# Patient Record
Sex: Female | Born: 1995 | Hispanic: Yes | Marital: Married | Smoking: Never smoker
Health system: Southern US, Community
[De-identification: ages and names within clinical notes are randomized; demographics above are authoritative.]

## PROBLEM LIST (undated history)

## (undated) ENCOUNTER — Emergency Department (HOSPITAL_COMMUNITY): Admission: EM | Payer: Medicaid Other | Source: Home / Self Care

## (undated) DIAGNOSIS — C801 Malignant (primary) neoplasm, unspecified: Secondary | ICD-10-CM

## (undated) DIAGNOSIS — E0590 Thyrotoxicosis, unspecified without thyrotoxic crisis or storm: Secondary | ICD-10-CM

## (undated) DIAGNOSIS — I10 Essential (primary) hypertension: Secondary | ICD-10-CM

## (undated) DIAGNOSIS — R519 Headache, unspecified: Secondary | ICD-10-CM

## (undated) DIAGNOSIS — F419 Anxiety disorder, unspecified: Secondary | ICD-10-CM

## (undated) DIAGNOSIS — R7303 Prediabetes: Secondary | ICD-10-CM

## (undated) DIAGNOSIS — R51 Headache: Secondary | ICD-10-CM

## (undated) DIAGNOSIS — F32A Depression, unspecified: Secondary | ICD-10-CM

## (undated) DIAGNOSIS — F329 Major depressive disorder, single episode, unspecified: Secondary | ICD-10-CM

## (undated) DIAGNOSIS — D649 Anemia, unspecified: Secondary | ICD-10-CM

## (undated) DIAGNOSIS — K429 Umbilical hernia without obstruction or gangrene: Secondary | ICD-10-CM

## (undated) DIAGNOSIS — N281 Cyst of kidney, acquired: Secondary | ICD-10-CM

## (undated) HISTORY — DX: Headache: R51

## (undated) HISTORY — DX: Headache, unspecified: R51.9

## (undated) HISTORY — PX: BUNIONECTOMY: SHX129

---
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
## 2014-03-04 DIAGNOSIS — F329 Major depressive disorder, single episode, unspecified: Secondary | ICD-10-CM | POA: Diagnosis not present

## 2014-03-04 DIAGNOSIS — O133 Gestational [pregnancy-induced] hypertension without significant proteinuria, third trimester: Secondary | ICD-10-CM

## 2014-03-04 DIAGNOSIS — F1290 Cannabis use, unspecified, uncomplicated: Secondary | ICD-10-CM | POA: Diagnosis not present

## 2014-03-04 DIAGNOSIS — O99343 Other mental disorders complicating pregnancy, third trimester: Secondary | ICD-10-CM | POA: Diagnosis not present

## 2014-03-04 DIAGNOSIS — O99323 Drug use complicating pregnancy, third trimester: Secondary | ICD-10-CM | POA: Diagnosis not present

## 2014-05-27 ENCOUNTER — Ambulatory Visit: Payer: Self-pay | Admitting: Internal Medicine

## 2014-08-28 LAB — OB RESULTS CONSOLE RPR: RPR: NONREACTIVE

## 2014-08-28 LAB — OB RESULTS CONSOLE HGB/HCT, BLOOD
HCT: 36 %
Hemoglobin: 11.9 g/dL

## 2014-08-28 LAB — OB RESULTS CONSOLE ANTIBODY SCREEN: Antibody Screen: NEGATIVE

## 2014-08-28 LAB — OB RESULTS CONSOLE GC/CHLAMYDIA
Chlamydia: NEGATIVE
Gonorrhea: NEGATIVE

## 2014-08-28 LAB — OB RESULTS CONSOLE ABO/RH: RH Type: POSITIVE

## 2014-08-28 LAB — OB RESULTS CONSOLE RUBELLA ANTIBODY, IGM: Rubella: IMMUNE

## 2014-08-28 LAB — OB RESULTS CONSOLE PLATELET COUNT: Platelets: 291 10*3/uL

## 2014-08-28 LAB — OB RESULTS CONSOLE HIV ANTIBODY (ROUTINE TESTING): HIV: NONREACTIVE

## 2014-08-28 LAB — OB RESULTS CONSOLE HEPATITIS B SURFACE ANTIGEN: Hepatitis B Surface Ag: NEGATIVE

## 2014-12-11 ENCOUNTER — Encounter: Payer: Self-pay | Admitting: *Deleted

## 2014-12-11 DIAGNOSIS — O0990 Supervision of high risk pregnancy, unspecified, unspecified trimester: Secondary | ICD-10-CM

## 2014-12-12 ENCOUNTER — Encounter: Payer: Self-pay | Admitting: Physician Assistant

## 2014-12-18 ENCOUNTER — Ambulatory Visit (INDEPENDENT_AMBULATORY_CARE_PROVIDER_SITE_OTHER): Payer: Medicaid Other | Admitting: Advanced Practice Midwife

## 2014-12-18 ENCOUNTER — Encounter: Payer: Self-pay | Admitting: Advanced Practice Midwife

## 2014-12-18 VITALS — BP 129/76 | HR 98 | Temp 98.4°F | Wt 154.3 lb

## 2014-12-18 DIAGNOSIS — O2242 Hemorrhoids in pregnancy, second trimester: Secondary | ICD-10-CM

## 2014-12-18 DIAGNOSIS — Z3402 Encounter for supervision of normal first pregnancy, second trimester: Secondary | ICD-10-CM

## 2014-12-18 LAB — POCT URINALYSIS DIP (DEVICE)
Bilirubin Urine: NEGATIVE
Glucose, UA: NEGATIVE mg/dL
Hgb urine dipstick: NEGATIVE
Ketones, ur: NEGATIVE mg/dL
Leukocytes, UA: NEGATIVE
Nitrite: NEGATIVE
Protein, ur: NEGATIVE mg/dL
Specific Gravity, Urine: 1.02 (ref 1.005–1.030)
Urobilinogen, UA: 0.2 mg/dL (ref 0.0–1.0)
pH: 7 (ref 5.0–8.0)

## 2014-12-18 MED ORDER — PRENATAL VITAMINS 28-0.8 MG PO TABS: 1.0000 | ORAL_TABLET | Freq: Every day | ORAL | Status: DC

## 2014-12-18 MED ORDER — LIDOCAINE HCL 2 % EX GEL: 1.0000 "application " | CUTANEOUS | Status: DC | PRN

## 2014-12-18 NOTE — Progress Notes (Signed)
   Subjective:    Megan Webb is a G1P0000 [redacted]w[redacted]d being seen today for her first obstetrical visit.  Her obstetrical history is significant for hx anxiety and suicide attempt in 2013, G1 this pregnancy. Pregnancy history fully reviewed.  Patient reports no complaints.  Filed Vitals:   12/18/14 0856  BP: 129/76  Pulse: 98  Temp: 98.4 F (36.9 C)  Weight: 154 lb 4.8 oz (69.99 kg)    HISTORY: OB History  Gravida Para Term Preterm AB SAB TAB Ectopic Multiple Living  1 0 0 0 0 0 0 0 0 0     # Outcome Date GA Lbr Len/2nd Weight Sex Delivery Anes PTL Lv  1 Current              Past Medical History  Diagnosis Date  . Headache    Past Surgical History  Procedure Laterality Date  . Bunionectomy    . Bunionectomy     Family History  Problem Relation Age of Onset  . Mental illness Mother     anxiety and depression  . Hypertension Mother   . Kidney disease Mother   . Anemia Mother   . Cataracts Father   . Asthma Brother   . Diabetes Maternal Aunt   . Cancer Maternal Aunt     breast  . Diabetes Maternal Uncle   . Arthritis Maternal Grandmother   . Stroke Maternal Grandmother   . Hypertension Maternal Grandmother   . Arthritis Maternal Grandfather   . Hypertension Maternal Grandfather   . Stroke Maternal Grandfather   . Arthritis Paternal Grandfather   . Stroke Paternal Grandfather      Exam    Uterus:  Fundal Height: 27 cm  Pelvic Exam: Deferred--reviewed records   Skin: normal coloration and turgor, no rashes    Neurologic: oriented, normal, gait normal; reflexes normal and symmetric   Extremities: normal strength, tone, and muscle mass, ROM of all joints is normal   HEENT sclera clear, anicteric   Mouth/Teeth mucous membranes moist, pharynx normal without lesions and dental hygiene good   Neck supple and no masses   Cardiovascular:    Respiratory:  appears well, vitals normal, no respiratory distress, acyanotic, normal RR, ear and throat exam is  normal, neck free of mass or lymphadenopathy   Abdomen: soft, non-tender; bowel sounds normal; no masses,  no organomegaly   Urinary:       Assessment:    Pregnancy: G1P0000  1. Encounter for supervision of normal first pregnancy in second trimester   2. Hemorrhoids during pregnancy in second trimester, antepartum Discussed OTC treatments including Tucks pads, hemorrhoid cream.  Rx for Lidocaine jelly 2% topical.  Recommend high fiber diet, increased PO fluids to prevent constipation.   Patient Active Problem List   Diagnosis Date Noted  . Supervision of high-risk pregnancy 12/11/2014    Priority: High  . Cannabis abuse 08/16/2011  . Suicide attempt by drug ingestion ([place]) 08/15/2011  . 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 Anxiety 08/15/2011  . Benzodiazepine (tranquilizer) overdose 08/14/2011  . Somnolence 08/14/2011        Plan:     Initial labs drawn. Prenatal vitamins. Problem list reviewed and updated. Genetic Screening discussed First Screen: results reviewed.  Ultrasound discussed; fetal survey: results reviewed.  Follow up in 2 weeks.   LEFTWICH-KIRBY, LISA 12/18/2014

## 2014-12-18 NOTE — Patient Instructions (Signed)
Recommend Tucks witchhazel pads, hemorrhoid cream, Rx for lidocaine jelly for hemorrhoids  Hemorrhoids Hemorrhoids are swollen veins around the rectum or anus. There are two types of hemorrhoids:   Internal hemorrhoids. These occur in the veins just inside the rectum. They may poke through to the outside and become irritated and painful.  External hemorrhoids. These occur in the veins outside the anus and can be felt as a painful swelling or hard lump near the anus. CAUSES  Pregnancy.   Obesity.   Constipation or diarrhea.   Straining to have a bowel movement.   Sitting for long periods on the toilet.  Heavy lifting or other activity that caused you to strain.  Anal intercourse. SYMPTOMS   Pain.   Anal itching or irritation.   Rectal bleeding.   Fecal leakage.   Anal swelling.   One or more lumps around the anus.  DIAGNOSIS  Your caregiver may be able to diagnose hemorrhoids by visual examination. Other examinations or tests that may be performed include:   Examination of the rectal area with a gloved hand (digital rectal exam).   Examination of anal canal using a small tube (scope).   A blood test if you have lost a significant amount of blood.  A test to look inside the colon (sigmoidoscopy or colonoscopy). TREATMENT Most hemorrhoids can be treated at home. However, if symptoms do not seem to be getting better or if you have a lot of rectal bleeding, your caregiver may perform a procedure to help make the hemorrhoids get smaller or remove them completely. Possible treatments include:   Placing a rubber band at the base of the hemorrhoid to cut off the circulation (rubber band ligation).   Injecting a chemical to shrink the hemorrhoid (sclerotherapy).   Using a tool to burn the hemorrhoid (infrared light therapy).   Surgically removing the hemorrhoid (hemorrhoidectomy).   Stapling the hemorrhoid to block blood flow to the tissue (hemorrhoid  stapling).  HOME CARE INSTRUCTIONS   Eat foods with fiber, such as whole grains, beans, nuts, fruits, and vegetables. Ask your doctor about taking products with added fiber in them (fibersupplements).  Increase fluid intake. Drink enough water and fluids to keep your urine clear or pale yellow.   Exercise regularly.   Go to the bathroom when you have the urge to have a bowel movement. Do not wait.   Avoid straining to have bowel movements.   Keep the anal area dry and clean. Use wet toilet paper or moist towelettes after a bowel movement.   Medicated creams and suppositories may be used or applied as directed.   Only take over-the-counter or prescription medicines as directed by your caregiver.   Take warm sitz baths for 15-20 minutes, 3-4 times a day to ease pain and discomfort.   Place ice packs on the hemorrhoids if they are tender and swollen. Using ice packs between sitz baths may be helpful.   Put ice in a plastic bag.   Place a towel between your skin and the bag.   Leave the ice on for 15-20 minutes, 3-4 times a day.   Do not use a donut-shaped pillow or sit on the toilet for long periods. This increases blood pooling and pain.  SEEK MEDICAL CARE IF:  You have increasing pain and swelling that is not controlled by treatment or medicine.  You have uncontrolled bleeding.  You have difficulty or you are unable to have a bowel movement.  You have pain or inflammation  outside the area of the hemorrhoids. MAKE SURE YOU:  Understand these instructions.  Will watch your condition.  Will get help right away if you are not doing well or get worse.   This information is not intended to replace advice given to you by your health care provider. Make sure you discuss any questions you have with your health care provider.   Document Released: 01/09/2000 Document Revised: 12/29/2011 Document Reviewed: 11/16/2011 Elsevier Interactive Patient Education [employer]

## 2014-12-31 ENCOUNTER — Encounter: Payer: Self-pay | Admitting: Advanced Practice Midwife

## 2014-12-31 ENCOUNTER — Ambulatory Visit (INDEPENDENT_AMBULATORY_CARE_PROVIDER_SITE_OTHER): Payer: Medicaid Other | Admitting: Advanced Practice Midwife

## 2014-12-31 VITALS — BP 128/71 | HR 88 | Temp 98.3°F | Wt 159.6 lb

## 2014-12-31 DIAGNOSIS — R7309 Other abnormal glucose: Secondary | ICD-10-CM

## 2014-12-31 DIAGNOSIS — O0992 Supervision of high risk pregnancy, unspecified, second trimester: Secondary | ICD-10-CM

## 2014-12-31 DIAGNOSIS — O99810 Abnormal glucose complicating pregnancy: Secondary | ICD-10-CM | POA: Insufficient documentation

## 2014-12-31 DIAGNOSIS — Z23 Encounter for immunization: Secondary | ICD-10-CM

## 2014-12-31 DIAGNOSIS — R7302 Impaired glucose tolerance (oral): Secondary | ICD-10-CM

## 2014-12-31 LAB — POCT URINALYSIS DIP (DEVICE)
Bilirubin Urine: NEGATIVE
Glucose, UA: 500 mg/dL — AB
Hgb urine dipstick: NEGATIVE
Ketones, ur: NEGATIVE mg/dL
Nitrite: NEGATIVE
Protein, ur: NEGATIVE mg/dL
Specific Gravity, Urine: 1.01 (ref 1.005–1.030)
Urobilinogen, UA: 0.2 mg/dL (ref 0.0–1.0)
pH: 6 (ref 5.0–8.0)

## 2014-12-31 MED ORDER — TETANUS-DIPHTH-ACELL PERTUSSIS 5-2.5-18.5 LF-MCG/0.5 IM SUSP
0.5000 mL | Freq: Once | INTRAMUSCULAR | Status: AC
  Administered 2014-12-31: 0.5 mL via INTRAMUSCULAR

## 2014-12-31 NOTE — Patient Instructions (Signed)

## 2014-12-31 NOTE — Progress Notes (Signed)
Subjective:  Megan Webb is a 19 y.o. G1P0000 at [redacted]w[redacted]d being seen today for ongoing prenatal care.  She is currently monitored for the following issues for this low-risk pregnancy and has Benzodiazepine (tranquilizer) overdose; Somnolence; Suicide attempt by drug ingestion ([place]); Recurrent major depression-severe ([place]); Anxiety; Cannabis abuse; and Supervision of high-risk pregnancy on her problem list.  Patient reports no complaints.  Contractions: Not present. Vag. Bleeding: None.  Movement: Present. Denies leaking of fluid.   The following portions of the patient's history were reviewed and updated as appropriate: allergies, current medications, past family history, past medical history, past social history, past surgical history and problem list. Problem list updated.  Objective:   Filed Vitals:   12/31/14 1532  BP: 128/71  Pulse: 88  Temp: 98.3 F (36.8 C)  Weight: 72.394 kg (159 lb 9.6 oz)    Fetal Status: Fetal Heart Rate (bpm): 166   Movement: Present     General:  Alert, oriented and cooperative. Patient is in no acute distress.  Skin: Skin is warm and dry. No rash noted.   Cardiovascular: Normal heart rate noted  Respiratory: Normal respiratory effort, no problems with respiration noted  Abdomen: Soft, gravid, appropriate for gestational age. Pain/Pressure: Present     Pelvic: Vag. Bleeding: None Vag D/C Character: Mucous   Cervical exam deferred        Extremities: Normal range of motion.  Edema: Trace  Mental Status: Normal mood and affect. Normal behavior. Normal judgment and thought content.   Urinalysis: Urine Protein: Negative Urine Glucose: 3+  Assessment and Plan:  Pregnancy: G1P0000 at [redacted]w[redacted]d  1. Supervision of high-risk pregnancy, second trimester      Continue care - Tdap (BOOSTRIX) injection 0.5 mL; Inject 0.5 mLs into the muscle once.  Preterm labor symptoms and general obstetric precautions including but not limited to vaginal bleeding,  contractions, leaking of fluid and fetal movement were reviewed in detail with the patient. Please refer to After Visit Summary for other counseling recommendations.  Return in about 2 weeks (around 01/14/2015) for [facility].   Seabron Spates, CNM

## 2015-01-01 ENCOUNTER — Other Ambulatory Visit: Payer: 59

## 2015-01-08 ENCOUNTER — Other Ambulatory Visit: Payer: 59

## 2015-01-08 DIAGNOSIS — O99810 Abnormal glucose complicating pregnancy: Secondary | ICD-10-CM

## 2015-01-08 LAB — CBC
HCT: 31.5 % — ABNORMAL LOW (ref 36.0–46.0)
Hemoglobin: 10.2 g/dL — ABNORMAL LOW (ref 12.0–15.0)
MCH: 27.1 pg (ref 26.0–34.0)
MCHC: 32.4 g/dL (ref 30.0–36.0)
MCV: 83.8 fL (ref 78.0–100.0)
MPV: 10.2 fL (ref 8.6–12.4)
Platelets: 210 10*3/uL (ref 150–400)
RBC: 3.76 MIL/uL — ABNORMAL LOW (ref 3.87–5.11)
RDW: 15.4 % (ref 11.5–15.5)
WBC: 9.2 10*3/uL (ref 4.0–10.5)

## 2015-01-09 LAB — HIV ANTIBODY (ROUTINE TESTING W REFLEX): HIV 1&2 Ab, 4th Generation: NONREACTIVE

## 2015-01-09 LAB — GLUCOSE TOLERANCE, 1 HOUR (50G) W/O FASTING: Glucose, 1 Hour GTT: 130 mg/dL (ref 70–140)

## 2015-01-09 LAB — RPR

## 2015-01-14 ENCOUNTER — Encounter: Payer: 59 | Admitting: Advanced Practice Midwife

## 2015-01-15 ENCOUNTER — Encounter: Payer: Self-pay | Admitting: Advanced Practice Midwife

## 2015-01-15 ENCOUNTER — Ambulatory Visit (INDEPENDENT_AMBULATORY_CARE_PROVIDER_SITE_OTHER): Payer: 59 | Admitting: Advanced Practice Midwife

## 2015-01-15 VITALS — BP 124/58 | HR 78 | Temp 98.2°F | Wt 162.6 lb

## 2015-01-15 DIAGNOSIS — Z3403 Encounter for supervision of normal first pregnancy, third trimester: Secondary | ICD-10-CM

## 2015-01-15 LAB — POCT URINALYSIS DIP (DEVICE)
Bilirubin Urine: NEGATIVE
Glucose, UA: NEGATIVE mg/dL
Hgb urine dipstick: NEGATIVE
Ketones, ur: NEGATIVE mg/dL
Nitrite: NEGATIVE
Protein, ur: NEGATIVE mg/dL
Specific Gravity, Urine: 1.02 (ref 1.005–1.030)
Urobilinogen, UA: 0.2 mg/dL (ref 0.0–1.0)
pH: 6.5 (ref 5.0–8.0)

## 2015-01-15 NOTE — Progress Notes (Signed)
Subjective:  Megan Webb is a 19 y.o. G1P0000 at [redacted]w[redacted]d being seen today for ongoing prenatal care.  She is currently monitored for the following issues for this high risk pregnancy and has Benzodiazepine (tranquilizer) overdose; Somnolence; Suicide attempt by drug ingestion ([place]); Recurrent major depression-severe ([place]); Anxiety; Cannabis abuse; Supervision of high-risk pregnancy; and Abnormal glucose tolerance in pregnancy on her problem list.  Patient reports no complaints.  Contractions: Not present. Vag. Bleeding: None.  Movement: Present. Denies leaking of fluid.   The following portions of the patient's history were reviewed and updated as appropriate: allergies, current medications, past family history, past medical history, past social history, past surgical history and problem list. Problem list updated.  Objective:   Filed Vitals:   01/15/15 1543  BP: 124/58  Pulse: 78  Temp: 98.2 F (36.8 C)  Weight: 162 lb 9.6 oz (73.755 kg)    Fetal Status: Fetal Heart Rate (bpm): 144   Movement: Present     General:  Alert, oriented and cooperative. Patient is in no acute distress.  Skin: Skin is warm and dry. No rash noted.   Cardiovascular: Normal heart rate noted  Respiratory: Normal respiratory effort, no problems with respiration noted  Abdomen: Soft, gravid, appropriate for gestational age. Pain/Pressure: Absent     Pelvic: Vag. Bleeding: None     Cervical exam deferred        Extremities: Normal range of motion.  Edema: Trace  Mental Status: Normal mood and affect. Normal behavior. Normal judgment and thought content.   Urinalysis: Urine Protein: Negative Urine Glucose: Negative  Assessment and Plan:  Pregnancy: G1P0000 at [redacted]w[redacted]d  There are no diagnoses linked to this encounter. Preterm labor symptoms and general obstetric precautions including but not limited to vaginal bleeding, contractions, leaking of fluid and fetal movement were reviewed in detail with the  patient. Please refer to After Visit Summary for other counseling recommendations.  RTC 2 weeks  Seabron Spates, CNM

## 2015-01-15 NOTE — Progress Notes (Signed)
Breastfeeding tip of the week reviewed. 

## 2015-01-15 NOTE — Patient Instructions (Signed)

## 2015-01-26 NOTE — L&D Delivery Note (Signed)
Delivery Note At 10:13 PM a viable female was delivered via Vaginal, Spontaneous Delivery (Presentation: ; Occiput Anterior).  APGAR: 8, 9; weight  .   Placenta status: Intact, Spontaneous.  Cord: 3 vessels with the following complications: None.  Cord pH:   Anesthesia: Epidural Local  Episiotomy: None Lacerations: Periurethral, bilateral Suture Repair: 3.0 monocryl Est. Blood Loss (mL): 750  Mom to postpartum.  Baby to Couplet care / Skin to Skin.  Megan Webb H 03/13/2015, 12:53 AM

## 2015-01-29 ENCOUNTER — Ambulatory Visit (INDEPENDENT_AMBULATORY_CARE_PROVIDER_SITE_OTHER): Payer: 59 | Admitting: Advanced Practice Midwife

## 2015-01-29 VITALS — BP 124/72 | HR 83 | Temp 98.5°F | Wt 163.0 lb

## 2015-01-29 DIAGNOSIS — O0992 Supervision of high risk pregnancy, unspecified, second trimester: Secondary | ICD-10-CM

## 2015-01-29 DIAGNOSIS — F1210 Cannabis abuse, uncomplicated: Secondary | ICD-10-CM

## 2015-01-29 DIAGNOSIS — O99323 Drug use complicating pregnancy, third trimester: Secondary | ICD-10-CM

## 2015-01-29 DIAGNOSIS — O2340 Unspecified infection of urinary tract in pregnancy, unspecified trimester: Secondary | ICD-10-CM

## 2015-01-29 DIAGNOSIS — Z3483 Encounter for supervision of other normal pregnancy, third trimester: Secondary | ICD-10-CM

## 2015-01-29 LAB — POCT URINALYSIS DIP (DEVICE)
Bilirubin Urine: NEGATIVE
Glucose, UA: NEGATIVE mg/dL
Hgb urine dipstick: NEGATIVE
Ketones, ur: 40 mg/dL — AB
Nitrite: NEGATIVE
Protein, ur: NEGATIVE mg/dL
Specific Gravity, Urine: 1.025 (ref 1.005–1.030)
Urobilinogen, UA: 0.2 mg/dL (ref 0.0–1.0)
pH: 6.5 (ref 5.0–8.0)

## 2015-01-29 NOTE — Progress Notes (Signed)
Subjective:  Megan Webb is a 20 y.o. G1P0000 at [redacted]w[redacted]d being seen today for ongoing prenatal care.  She is currently monitored for the following issues for this low-risk pregnancy and has Benzodiazepine (tranquilizer) overdose; Somnolence; Suicide attempt by drug ingestion ([place]); Recurrent major depression-severe ([place]); Anxiety; Cannabis abuse; Supervision of high-risk pregnancy; and Abnormal glucose tolerance in pregnancy on her problem list.  Patient reports no complaints.  Contractions: Not present. Vag. Bleeding: None.  Movement: Present. Denies leaking of fluid.   The following portions of the patient's history were reviewed and updated as appropriate: allergies, current medications, past family history, past medical history, past social history, past surgical history and problem list. Problem list updated.  Objective:   Filed Vitals:   01/29/15 1437  BP: 124/72  Pulse: 83  Temp: 98.5 F (36.9 C)  Weight: 163 lb (73.936 kg)    Fetal Status: Fetal Heart Rate (bpm): 145 Fundal Height: 32 cm Movement: Present     General:  Alert, oriented and cooperative. Patient is in no acute distress.  Skin: Skin is warm and dry. No rash noted.   Cardiovascular: Normal heart rate noted  Respiratory: Normal respiratory effort, no problems with respiration noted  Abdomen: Soft, gravid, appropriate for gestational age. Pain/Pressure: Absent     Pelvic: Vag. Bleeding: None     Cervical exam deferred        Extremities: Normal range of motion.  Edema: Trace  Mental Status: Normal mood and affect. Normal behavior. Normal judgment and thought content.   Urinalysis: Urine Protein: Negative Urine Glucose: Negative  Assessment and Plan:  Pregnancy: G1P0000 at [redacted]w[redacted]d  1. Supervision of high-risk pregnancy, second trimester   2. UTI in pregnancy, antepartum, unspecified trimester  - Culture, OB Urine  Preterm labor symptoms and general obstetric precautions including but not limited to  vaginal bleeding, contractions, leaking of fluid and fetal movement were reviewed in detail with the patient. Please refer to After Visit Summary for other counseling recommendations.  Return in about 2 weeks (around 02/12/2015).   Manya Silvas, CNM

## 2015-01-29 NOTE — Patient Instructions (Addendum)
Preterm Labor Information Preterm labor is when labor starts at less than 37 weeks of pregnancy. The normal length of a pregnancy is 39 to 41 weeks. CAUSES Often, there is no identifiable underlying cause as to why a woman goes into preterm labor. One of the most common known causes of preterm labor is infection. Infections of the uterus, cervix, vagina, amniotic sac, bladder, kidney, or even the lungs (pneumonia) can cause labor to start. Other suspected causes of preterm labor include:   Urogenital infections, such as yeast infections and bacterial vaginosis.   Uterine abnormalities (uterine shape, uterine septum, fibroids, or bleeding from the placenta).   A cervix that has been operated on (it may fail to stay closed).   Malformations in the fetus.   Multiple gestations (twins, triplets, and so on).   Breakage of the amniotic sac.  RISK FACTORS  Having a previous history of preterm labor.   Having premature rupture of membranes (PROM).   Having a placenta that covers the opening of the cervix (placenta previa).   Having a placenta that separates from the uterus (placental abruption).   Having a cervix that is too weak to hold the fetus in the uterus (incompetent cervix).   Having too much fluid in the amniotic sac (polyhydramnios).   Taking illegal drugs or smoking while pregnant.   Not gaining enough weight while pregnant.   Being younger than 13 and older than 21 years old.   Having a low socioeconomic status.   Being African American. SYMPTOMS Signs and symptoms of preterm labor include:   Menstrual-like cramps, abdominal pain, or back pain.  Uterine contractions that are regular, as frequent as six in an hour, regardless of their intensity (may be mild or painful).  Contractions that start on the top of the uterus and spread down to the lower abdomen and back.   A sense of increased pelvic pressure.   A watery or bloody mucus discharge that  comes from the vagina.  TREATMENT Depending on the length of the pregnancy and other circumstances, your health care provider may suggest bed rest. If necessary, there are medicines that can be given to stop contractions and to mature the fetal lungs. If labor happens before 34 weeks of pregnancy, a prolonged hospital stay may be recommended. Treatment depends on the condition of both you and the fetus.  WHAT SHOULD YOU DO IF YOU THINK YOU ARE IN PRETERM LABOR? Call your health care provider right away. You will need to go to the hospital to get checked immediately. HOW CAN YOU PREVENT PRETERM LABOR IN FUTURE PREGNANCIES? You should:   Stop smoking if you smoke.  Maintain healthy weight gain and avoid chemicals and drugs that are not necessary.  Be watchful for any type of infection.  Inform your health care provider if you have a known history of preterm labor.   This information is not intended to replace advice given to you by your health care provider. Make sure you discuss any questions you have with your health care provider.   Document Released: 04/03/2003 Document Revised: 09/13/2012 Document Reviewed: 02/14/2012 Elsevier Interactive Patient Education [employer].  Contraception Choices Contraception (birth control) is the use of any methods or devices to prevent pregnancy. Below are some methods to help avoid pregnancy. HORMONAL METHODS   Contraceptive implant. This is a thin, plastic tube containing progesterone hormone. It does not contain estrogen hormone. Your health care provider inserts the tube in the inner part of the upper  arm. The tube can remain in place for up to 3 years. After 3 years, the implant must be removed. The implant prevents the ovaries from releasing an egg (ovulation), thickens the cervical mucus to prevent sperm from entering the uterus, and thins the lining of the inside of the uterus.  Progesterone-only injections. These injections are given  every 3 months by your health care provider to prevent pregnancy. This synthetic progesterone hormone stops the ovaries from releasing eggs. It also thickens cervical mucus and changes the uterine lining. This makes it harder for sperm to survive in the uterus.  Birth control pills. These pills contain estrogen and progesterone hormone. They work by preventing the ovaries from releasing eggs (ovulation). They also cause the cervical mucus to thicken, preventing the sperm from entering the uterus. Birth control pills are prescribed by a health care provider.Birth control pills can also be used to treat heavy periods.  Minipill. This type of birth control pill contains only the progesterone hormone. They are taken every day of each month and must be prescribed by your health care provider.  Birth control patch. The patch contains hormones similar to those in birth control pills. It must be changed once a week and is prescribed by a health care provider.  Vaginal ring. The ring contains hormones similar to those in birth control pills. It is left in the vagina for 3 weeks, removed for 1 week, and then a new one is put back in place. The patient must be comfortable inserting and removing the ring from the vagina.A health care provider's prescription is necessary.  Emergency contraception. Emergency contraceptives prevent pregnancy after unprotected sexual intercourse. This pill can be taken right after sex or up to 5 days after unprotected sex. It is most effective the sooner you take the pills after having sexual intercourse. Most emergency contraceptive pills are available without a prescription. Check with your pharmacist. Do not use emergency contraception as your only form of birth control. BARRIER METHODS   Female condom. This is a thin sheath (latex or rubber) that is worn over the penis during sexual intercourse. It can be used with spermicide to increase effectiveness.  Female condom. This is a  soft, loose-fitting sheath that is put into the vagina before sexual intercourse.  Diaphragm. This is a soft, latex, dome-shaped barrier that must be fitted by a health care provider. It is inserted into the vagina, along with a spermicidal jelly. It is inserted before intercourse. The diaphragm should be left in the vagina for 6 to 8 hours after intercourse.  Cervical cap. This is a round, soft, latex or plastic cup that fits over the cervix and must be fitted by a health care provider. The cap can be left in place for up to 48 hours after intercourse.  Sponge. This is a soft, circular piece of polyurethane foam. The sponge has spermicide in it. It is inserted into the vagina after wetting it and before sexual intercourse.  Spermicides. These are chemicals that kill or block sperm from entering the cervix and uterus. They come in the form of creams, jellies, suppositories, foam, or tablets. They do not require a prescription. They are inserted into the vagina with an applicator before having sexual intercourse. The process must be repeated every time you have sexual intercourse. INTRAUTERINE CONTRACEPTION  Intrauterine device (IUD). This is a T-shaped device that is put in a woman's uterus during a menstrual period to prevent pregnancy. There are 2 types:  Copper IUD. This  type of IUD is wrapped in copper wire and is placed inside the uterus. Copper makes the uterus and fallopian tubes produce a fluid that kills sperm. It can stay in place for 10 years.  Hormone IUD. This type of IUD contains the hormone progestin (synthetic progesterone). The hormone thickens the cervical mucus and prevents sperm from entering the uterus, and it also thins the uterine lining to prevent implantation of a fertilized egg. The hormone can weaken or kill the sperm that get into the uterus. It can stay in place for 3-5 years, depending on which type of IUD is used. PERMANENT METHODS OF CONTRACEPTION  Female tubal  ligation. This is when the woman's fallopian tubes are surgically sealed, tied, or blocked to prevent the egg from traveling to the uterus.  Hysteroscopic sterilization. This involves placing a small coil or insert into each fallopian tube. Your doctor uses a technique called hysteroscopy to do the procedure. The device causes scar tissue to form. This results in permanent blockage of the fallopian tubes, so the sperm cannot fertilize the egg. It takes about 3 months after the procedure for the tubes to become blocked. You must use another form of birth control for these 3 months.  Female sterilization. This is when the female has the tubes that carry sperm tied off (vasectomy).This blocks sperm from entering the vagina during sexual intercourse. After the procedure, the man can still ejaculate fluid (semen). NATURAL PLANNING METHODS  Natural family planning. This is not having sexual intercourse or using a barrier method (condom, diaphragm, cervical cap) on days the woman could become pregnant.  Calendar method. This is keeping track of the length of each menstrual cycle and identifying when you are fertile.  Ovulation method. This is avoiding sexual intercourse during ovulation.  Symptothermal method. This is avoiding sexual intercourse during ovulation, using a thermometer and ovulation symptoms.  Post-ovulation method. This is timing sexual intercourse after you have ovulated. Regardless of which type or method of contraception you choose, it is important that you use condoms to protect against the transmission of sexually transmitted infections (STIs). Talk with your health care provider about which form of contraception is most appropriate for you.   This information is not intended to replace advice given to you by your health care provider. Make sure you discuss any questions you have with your health care provider.   Document Released: 01/11/2005 Document Revised: 01/16/2013 Document  Reviewed: 07/06/2012 Elsevier Interactive Patient Education [employer].

## 2015-01-30 LAB — CULTURE, OB URINE: Colony Count: >=100000

## 2015-02-02 ENCOUNTER — Other Ambulatory Visit: Payer: Self-pay | Admitting: Advanced Practice Midwife

## 2015-02-02 ENCOUNTER — Encounter: Payer: Self-pay | Admitting: Advanced Practice Midwife

## 2015-02-02 DIAGNOSIS — R8271 Bacteriuria: Secondary | ICD-10-CM | POA: Insufficient documentation

## 2015-02-02 MED ORDER — AMOXICILLIN 500 MG PO CAPS: 500.0000 mg | ORAL_CAPSULE | Freq: Three times a day (TID) | ORAL | Status: DC

## 2015-02-02 NOTE — Progress Notes (Signed)
GBS bacteriuria. Rx Amox. PCN in labor.

## 2015-02-04 ENCOUNTER — Telehealth: Payer: Self-pay

## 2015-02-04 NOTE — Telephone Encounter (Signed)
Pt notified of lab results and to pick up rx

## 2015-02-04 NOTE — Telephone Encounter (Signed)
-----   Message from [place], [place] sent at 02/02/2015  3:04 AM EST ----- GBS UTI. Rx Amox sent.

## 2015-02-12 ENCOUNTER — Ambulatory Visit (INDEPENDENT_AMBULATORY_CARE_PROVIDER_SITE_OTHER): Payer: 59 | Admitting: Obstetrics & Gynecology

## 2015-02-12 ENCOUNTER — Encounter: Payer: Self-pay | Admitting: Obstetrics & Gynecology

## 2015-02-12 VITALS — BP 130/82 | HR 89 | Temp 98.5°F | Wt 167.6 lb

## 2015-02-12 DIAGNOSIS — R8271 Bacteriuria: Secondary | ICD-10-CM

## 2015-02-12 DIAGNOSIS — O0993 Supervision of high risk pregnancy, unspecified, third trimester: Secondary | ICD-10-CM

## 2015-02-12 DIAGNOSIS — O99810 Abnormal glucose complicating pregnancy: Secondary | ICD-10-CM

## 2015-02-12 LAB — POCT URINALYSIS DIP (DEVICE)
Bilirubin Urine: NEGATIVE
Glucose, UA: 100 mg/dL — AB
Ketones, ur: NEGATIVE mg/dL
Leukocytes, UA: NEGATIVE
Nitrite: NEGATIVE
Protein, ur: NEGATIVE mg/dL
Specific Gravity, Urine: 1.025 (ref 1.005–1.030)
Urobilinogen, UA: 0.2 mg/dL (ref 0.0–1.0)
pH: 6 (ref 5.0–8.0)

## 2015-02-12 NOTE — Progress Notes (Signed)
Subjective:  Megan Webb is a 20 y.o. HG1P0000 at [redacted]w[redacted]d being seen today for ongoing prenatal care.  She is currently monitored for the following issues for this low-risk pregnancy and has Benzodiazepine (tranquilizer) overdose; Somnolence; Suicide attempt by drug ingestion ([place]); Recurrent major depression-severe ([place]); Anxiety; Cannabis abuse; Supervision of high-risk pregnancy; Abnormal glucose tolerance in pregnancy; and GBS bacteriuria on her problem list.  Patient reports no complaints.  Contractions: Irritability. Vag. Bleeding: None.  Movement: Present. Denies leaking of fluid.   The following portions of the patient's history were reviewed and updated as appropriate: allergies, current medications, past family history, past medical history, past social history, past surgical history and problem list. Problem list updated.  Objective:   Filed Vitals:   02/12/15 1515  BP: 130/82  Pulse: 89  Temp: 98.5 F (36.9 C)  Weight: 167 lb 9.6 oz (76.023 kg)    Fetal Status: Fetal Heart Rate (bpm): 150   Movement: Present     General:  Alert, oriented and cooperative. Patient is in no acute distress.  Skin: Skin is warm and dry. No rash noted.   Cardiovascular: Normal heart rate noted  Respiratory: Normal respiratory effort, no problems with respiration noted  Abdomen: Soft, gravid, appropriate for gestational age. Pain/Pressure: Present     Pelvic: Vag. Bleeding: None     Cervical exam deferred        Extremities: Normal range of motion.  Edema: Trace  Mental Status: Normal mood and affect. Normal behavior. Normal judgment and thought content.   Urinalysis: Urine Protein: Negative Urine Glucose: 1+  Assessment and Plan:  Pregnancy: G1P0000 at [redacted]w[redacted]d  1. Abnormal glucose tolerance in pregnancy   2. Supervision of high-risk pregnancy, third trimester   3. GBS bacteriuria - treat in labor  Preterm labor symptoms and general obstetric precautions including but not  limited to vaginal bleeding, contractions, leaking of fluid and fetal movement were reviewed in detail with the patient. Please refer to After Visit Summary for other counseling recommendations.  Return in about 2 weeks (around 02/26/2015) for cultures at next visit.   Emily Filbert, MD

## 2015-02-13 ENCOUNTER — Encounter: Payer: Self-pay | Admitting: *Deleted

## 2015-02-13 NOTE — Progress Notes (Signed)
FMLA papers completed for patient.  Registration staff will scan to patient's chart and will contact patient to pick up.

## 2015-03-05 ENCOUNTER — Ambulatory Visit (INDEPENDENT_AMBULATORY_CARE_PROVIDER_SITE_OTHER): Payer: 59 | Admitting: Student

## 2015-03-05 VITALS — BP 131/85 | HR 81 | Wt 174.7 lb

## 2015-03-05 DIAGNOSIS — Z113 Encounter for screening for infections with a predominantly sexual mode of transmission: Secondary | ICD-10-CM | POA: Diagnosis not present

## 2015-03-05 DIAGNOSIS — O0993 Supervision of high risk pregnancy, unspecified, third trimester: Secondary | ICD-10-CM

## 2015-03-05 DIAGNOSIS — N898 Other specified noninflammatory disorders of vagina: Secondary | ICD-10-CM | POA: Diagnosis not present

## 2015-03-05 DIAGNOSIS — O133 Gestational [pregnancy-induced] hypertension without significant proteinuria, third trimester: Secondary | ICD-10-CM | POA: Diagnosis not present

## 2015-03-05 LAB — POCT URINALYSIS DIP (DEVICE)
Bilirubin Urine: NEGATIVE
Glucose, UA: 100 mg/dL — AB
Hgb urine dipstick: NEGATIVE
Ketones, ur: NEGATIVE mg/dL
Leukocytes, UA: NEGATIVE
Nitrite: NEGATIVE
Protein, ur: 100 mg/dL — AB
Specific Gravity, Urine: 1.025 (ref 1.005–1.030)
Urobilinogen, UA: 0.2 mg/dL (ref 0.0–1.0)
pH: 6.5 (ref 5.0–8.0)

## 2015-03-05 NOTE — Progress Notes (Signed)
Pt reports increased swelling the past few weeks. Also having vaginal irritation and some itching

## 2015-03-05 NOTE — Progress Notes (Signed)
  Subjective:  Megan Webb is a 20 y.o. G1P0000 at 27w1dbeing seen today for ongoing prenatal care.  She is currently monitored for the following issues for this low-risk pregnancy and has Benzodiazepine (tranquilizer) overdose; Somnolence; [place] drug [place]; [place]; Anxiety; Cannabis abuse; Supervision of high-risk pregnancy; Abnormal glucose tolerance in pregnancy; and GBS bacteriuria on her problem list.  Patient reports vaginal irritation x 2 days with some yellow discharge x 2 weeks.  Contractions: Irritability. Vag. Bleeding: None.  Movement: Present. Denies leaking of fluid.   The following portions of the patient's history were reviewed and updated as appropriate: allergies, current medications, past family history, past medical history, past social history, past surgical history and problem list. Problem list updated.  Objective:   Filed Vitals:   03/05/15 1533 03/05/15 1537  BP: 148/77 131/85  Pulse: 81   Weight: 174 lb 11.2 oz (79.243 kg)     Fetal Status: Fetal Heart Rate (bpm): 142   Movement: Present     General:  Alert, oriented and cooperative. Patient is in no acute distress.  Skin: Skin is warm and dry. No rash noted.   Cardiovascular: Normal heart rate noted  Respiratory: Normal respiratory effort, no problems with respiration noted  Abdomen: Soft, gravid, appropriate for gestational age. Pain/Pressure: Present     Pelvic: Vag. Bleeding: None     Vulva normal - no erythema noted Cervical exam performed         3/50/-3 posterior, vertex   Extremities: Normal range of motion.  Edema: Mild pitting, slight indentation  Mental Status: Normal mood and affect. Normal behavior. Normal judgment and thought content.   Urinalysis: Urine Protein: 2+ Urine Glucose: 1+  Assessment and Plan:  Pregnancy: G1P0000 at 365w1d1. Vaginal irritation  - GC[facility]Chlam[facility],  genital  2. Transient hypertension of pregnancy in third trimester Baseline labs - no h/a, vision changes, epigastric pain - CBC - Comp Met (CMET) - Protein, Urine, 24 hour  Term labor symptoms and general obstetric precautions including but not limited to vaginal bleeding, contractions, leaking of fluid and fetal movement were reviewed in detail with the patient. Please refer to After Visit Summary for other counseling recommendations.  Return in about 1 week (around 03/12/2015) for Routine OB AND to return 24 hour urine collection.   ErJorje GuildNP

## 2015-03-05 NOTE — Patient Instructions (Signed)
24-Hour Urine Collection °HOW DO I DO A 24-HOUR URINE COLLECTION? °· When you get up in the morning, urinate in the toilet and flush. Write down the time. This will be your start time on the day of collection and your end time on the next morning. °· From then on, collect all of your urine in the plastic jug that is given to you. °· Stop collecting your urine 24 hours after you started. °· If the plastic jug that is given to you already has liquid in it, that is okay. Do not throw out the liquid or rinse out the jug. Some tests need the liquid to be added to your urine. °· Keep your plastic jug cool in an ice chest or keep it in the refrigerator during the test. °· When 24 hours are over, bring your plastic jug to the clinic lab. Keep the jug cool in an ice chest while you are bringing it to the lab. °  °This information is not intended to replace advice given to you by your health care provider. Make sure you discuss any questions you have with your health care provider. °  °Document Released: 04/09/2008 Document Revised: 02/01/2014 Document Reviewed: 06/06/2013 °Elsevier Interactive Patient Education ©2016 [employer] ° ° ° ° °Hypertension During Pregnancy °Hypertension, or high blood pressure, is when there is extra pressure inside your blood vessels that carry blood from the heart to the rest of your body (arteries). It can happen at any time in life, including pregnancy. Hypertension during pregnancy can cause problems for you and your baby. Your baby might not weigh as much as he or she should at birth or might be born early (premature). Very bad cases of hypertension during pregnancy can be life-threatening.  °Different types of hypertension can occur during pregnancy. These include: °· Chronic hypertension. This happens when a woman has hypertension before pregnancy and it continues during pregnancy. °· Gestational hypertension. This is when hypertension develops during pregnancy. °· Preeclampsia or  toxemia of pregnancy. This is a very serious type of hypertension that develops only during pregnancy. It affects the whole body and can be very dangerous for both mother and baby.   °Gestational hypertension and preeclampsia usually go away after your baby is born. Your blood pressure will likely stabilize within 6 weeks. Women who have hypertension during pregnancy have a greater chance of developing hypertension later in life or with future pregnancies. °RISK FACTORS °There are certain factors that make it more likely for you to develop hypertension during pregnancy. These include: °· Having hypertension before pregnancy. °· Having hypertension during a previous pregnancy. °· Being overweight. °· Being older than 40 years. °· Being pregnant with more than one baby. °· Having diabetes or kidney problems. °SIGNS AND SYMPTOMS °Chronic and gestational hypertension rarely cause symptoms. Preeclampsia has symptoms, which may include: °· Increased protein in your urine. Your health care provider will check for this at every prenatal visit. °· Swelling of your hands and face. °· Rapid weight gain. °· Headaches. °· Visual changes. °· Being bothered by light. °· Abdominal pain, especially in the upper right area. °· Chest pain. °· Shortness of breath. °· Increased reflexes. °· Seizures. These occur with a more severe form of preeclampsia, called eclampsia. °DIAGNOSIS  °You may be diagnosed with hypertension during a regular prenatal exam. At each prenatal visit, you may have: °· Your blood pressure checked. °· A urine test to check for protein in your urine. °The type of hypertension you are diagnosed   with depends on when you developed it. It also depends on your specific blood pressure reading. °· Developing hypertension before 20 weeks of pregnancy is consistent with chronic hypertension. °· Developing hypertension after 20 weeks of pregnancy is consistent with gestational hypertension. °· Hypertension with increased  urinary protein is diagnosed as preeclampsia. °· Blood pressure measurements that stay above 160 systolic or 110 diastolic are a sign of severe preeclampsia. °TREATMENT °Treatment for hypertension during pregnancy varies. Treatment depends on the type of hypertension and how serious it is. °· If you take medicine for chronic hypertension, you may need to switch medicines. °¨ Medicines called ACE inhibitors should not be taken during pregnancy. °¨ Low-dose aspirin may be suggested for women who have risk factors for preeclampsia. °· If you have gestational hypertension, you may need to take a blood pressure medicine that is safe during pregnancy. Your health care provider will recommend the correct medicine. °· If you have severe preeclampsia, you may need to be in the hospital. Health care providers will watch you and your baby very closely. You also may need to take medicine called magnesium sulfate to prevent seizures and lower blood pressure. °· Sometimes, an early delivery is needed. This may be the case if the condition worsens. It would be done to protect you and your baby. The only cure for preeclampsia is delivery. °· Your health care provider may recommend that you take one low-dose aspirin (81 mg) each day to help prevent high blood pressure during your pregnancy if you are at risk for preeclampsia. You may be at risk for preeclampsia if: °¨ You had preeclampsia or eclampsia during a previous pregnancy. °¨ Your baby did not grow as expected during a previous pregnancy. °¨ You experienced preterm birth with a previous pregnancy. °¨ You experienced a separation of the placenta from the uterus (placental abruption) during a previous pregnancy. °¨ You experienced the loss of your baby during a previous pregnancy. °¨ You are pregnant with more than one baby. °¨ You have other medical conditions, such as diabetes or an autoimmune disease. °HOME CARE INSTRUCTIONS °· Schedule and keep all of your regular prenatal  care appointments. This is important. °· Take medicines only as directed by your health care provider. Tell your health care provider about all medicines you take. °· Eat as little salt as possible. °· Get regular exercise. °· Do not drink alcohol. °· Do not use tobacco products. °· Do not drink products with caffeine. °· Lie on your left side when resting. °SEEK IMMEDIATE MEDICAL CARE IF: °· You have severe abdominal pain. °· You have sudden swelling in your hands, ankles, or face. °· You gain 4 pounds (1.8 kg) or more in 1 week. °· You vomit repeatedly. °· You have vaginal bleeding. °· You do not feel your baby moving as much. °· You have a headache. °· You have blurred or double vision. °· You have muscle twitching or spasms. °· You have shortness of breath. °· You have blue fingernails or lips. °· You have blood in your urine. °MAKE SURE YOU: °· Understand these instructions. °· Will watch your condition. °· Will get help right away if you are not doing well or get worse. °  °This information is not intended to replace advice given to you by your health care provider. Make sure you discuss any questions you have with your health care provider. °  °Document Released: 09/29/2010 Document Revised: 02/01/2014 Document Reviewed: 08/10/2012 °Elsevier Interactive Patient Education ©2016 [employer] ° °  Inc.  

## 2015-03-06 LAB — GC/CHLAMYDIA PROBE AMP (~~LOC~~) NOT AT ARMC
Chlamydia: NEGATIVE
Neisseria Gonorrhea: NEGATIVE

## 2015-03-06 LAB — WET PREP, GENITAL
Clue Cells Wet Prep HPF POC: NONE SEEN
Trich, Wet Prep: NONE SEEN
Yeast Wet Prep HPF POC: NONE SEEN

## 2015-03-11 ENCOUNTER — Other Ambulatory Visit: Payer: 59

## 2015-03-11 LAB — COMPREHENSIVE METABOLIC PANEL
ALT: 10 U/L (ref 6–29)
AST: 16 U/L (ref 10–30)
Albumin: 2.7 g/dL — ABNORMAL LOW (ref 3.6–5.1)
Alkaline Phosphatase: 216 U/L — ABNORMAL HIGH (ref 33–115)
BUN: 12 mg/dL (ref 7–25)
CO2: 18 mmol/L — ABNORMAL LOW (ref 20–31)
Calcium: 8.6 mg/dL (ref 8.6–10.2)
Chloride: 107 mmol/L (ref 98–110)
Creat: 0.6 mg/dL (ref 0.50–1.10)
Glucose, Bld: 108 mg/dL — ABNORMAL HIGH (ref 65–99)
Potassium: 4 mmol/L (ref 3.5–5.3)
Sodium: 136 mmol/L (ref 135–146)
Total Bilirubin: 0.3 mg/dL (ref 0.2–1.2)
Total Protein: 5.3 g/dL — ABNORMAL LOW (ref 6.1–8.1)

## 2015-03-11 LAB — CBC
HCT: 30.9 % — ABNORMAL LOW (ref 36.0–46.0)
Hemoglobin: 10.1 g/dL — ABNORMAL LOW (ref 12.0–15.0)
MCH: 25.9 pg — ABNORMAL LOW (ref 26.0–34.0)
MCHC: 32.7 g/dL (ref 30.0–36.0)
MCV: 79.2 fL (ref 78.0–100.0)
MPV: 10.7 fL (ref 8.6–12.4)
Platelets: 178 10*3/uL (ref 150–400)
RBC: 3.9 MIL/uL (ref 3.87–5.11)
RDW: 17.4 % — ABNORMAL HIGH (ref 11.5–15.5)
WBC: 6.3 10*3/uL (ref 4.0–10.5)

## 2015-03-12 ENCOUNTER — Inpatient Hospital Stay (HOSPITAL_COMMUNITY): Payer: 59 | Admitting: Anesthesiology

## 2015-03-12 ENCOUNTER — Encounter: Payer: Self-pay | Admitting: Obstetrics and Gynecology

## 2015-03-12 ENCOUNTER — Inpatient Hospital Stay (HOSPITAL_COMMUNITY)
Admission: AD | Admit: 2015-03-12 | Discharge: 2015-03-14 | DRG: 774 | Disposition: A | Discharge status: Home / Self Care | Payer: 59 | Source: Ambulatory Visit | Attending: Obstetrics and Gynecology | Admitting: Obstetrics and Gynecology

## 2015-03-12 ENCOUNTER — Ambulatory Visit (INDEPENDENT_AMBULATORY_CARE_PROVIDER_SITE_OTHER): Payer: 59 | Admitting: Obstetrics and Gynecology

## 2015-03-12 VITALS — BP 139/92 | HR 66 | Temp 97.7°F | Wt 177.0 lb

## 2015-03-12 DIAGNOSIS — Z8249 Family history of ischemic heart disease and other diseases of the circulatory system: Secondary | ICD-10-CM | POA: Diagnosis not present

## 2015-03-12 DIAGNOSIS — Z833 Family history of diabetes mellitus: Secondary | ICD-10-CM | POA: Diagnosis not present

## 2015-03-12 DIAGNOSIS — O99343 Other mental disorders complicating pregnancy, third trimester: Secondary | ICD-10-CM | POA: Diagnosis not present

## 2015-03-12 DIAGNOSIS — Z3A38 38 weeks gestation of pregnancy: Secondary | ICD-10-CM

## 2015-03-12 DIAGNOSIS — O139 Gestational [pregnancy-induced] hypertension without significant proteinuria, unspecified trimester: Secondary | ICD-10-CM | POA: Diagnosis present

## 2015-03-12 DIAGNOSIS — F329 Major depressive disorder, single episode, unspecified: Secondary | ICD-10-CM

## 2015-03-12 DIAGNOSIS — O99323 Drug use complicating pregnancy, third trimester: Secondary | ICD-10-CM

## 2015-03-12 DIAGNOSIS — R8271 Bacteriuria: Secondary | ICD-10-CM

## 2015-03-12 DIAGNOSIS — O134 Gestational [pregnancy-induced] hypertension without significant proteinuria, complicating childbirth: Principal | ICD-10-CM | POA: Diagnosis present

## 2015-03-12 DIAGNOSIS — O99824 Streptococcus B carrier state complicating childbirth: Secondary | ICD-10-CM | POA: Diagnosis present

## 2015-03-12 DIAGNOSIS — Z823 Family history of stroke: Secondary | ICD-10-CM | POA: Diagnosis not present

## 2015-03-12 DIAGNOSIS — O1405 Mild to moderate pre-eclampsia, complicating the puerperium: Secondary | ICD-10-CM | POA: Diagnosis present

## 2015-03-12 DIAGNOSIS — O0993 Supervision of high risk pregnancy, unspecified, third trimester: Secondary | ICD-10-CM

## 2015-03-12 DIAGNOSIS — F1290 Cannabis use, unspecified, uncomplicated: Secondary | ICD-10-CM

## 2015-03-12 DIAGNOSIS — O99810 Abnormal glucose complicating pregnancy: Secondary | ICD-10-CM

## 2015-03-12 LAB — CBC
HCT: 31.1 % — ABNORMAL LOW (ref 36.0–46.0)
Hemoglobin: 10.1 g/dL — ABNORMAL LOW (ref 12.0–15.0)
MCH: 25.9 pg — ABNORMAL LOW (ref 26.0–34.0)
MCHC: 32.5 g/dL (ref 30.0–36.0)
MCV: 79.7 fL (ref 78.0–100.0)
Platelets: 185 10*3/uL (ref 150–400)
RBC: 3.9 MIL/uL (ref 3.87–5.11)
RDW: 17 % — ABNORMAL HIGH (ref 11.5–15.5)
WBC: 7.4 10*3/uL (ref 4.0–10.5)

## 2015-03-12 LAB — COMPREHENSIVE METABOLIC PANEL
ALT: 12 U/L — ABNORMAL LOW (ref 14–54)
AST: 23 U/L (ref 15–41)
Albumin: 2.6 g/dL — ABNORMAL LOW (ref 3.5–5.0)
Alkaline Phosphatase: 239 U/L — ABNORMAL HIGH (ref 38–126)
Anion gap: 7 (ref 5–15)
BUN: 13 mg/dL (ref 6–20)
CO2: 18 mmol/L — ABNORMAL LOW (ref 22–32)
Calcium: 8.8 mg/dL — ABNORMAL LOW (ref 8.9–10.3)
Chloride: 111 mmol/L (ref 101–111)
Creatinine, Ser: 0.65 mg/dL (ref 0.44–1.00)
GFR calc Af Amer: >60 mL/min (ref >60)
GFR calc non Af Amer: >60 mL/min (ref >60)
Glucose, Bld: 99 mg/dL (ref 65–99)
Potassium: 4.2 mmol/L (ref 3.5–5.1)
Sodium: 136 mmol/L (ref 135–145)
Total Bilirubin: 0.2 mg/dL — ABNORMAL LOW (ref 0.3–1.2)
Total Protein: 6 g/dL — ABNORMAL LOW (ref 6.5–8.1)

## 2015-03-12 LAB — POCT URINALYSIS DIP (DEVICE)
Bilirubin Urine: NEGATIVE
Glucose, UA: NEGATIVE mg/dL
Hgb urine dipstick: NEGATIVE
Ketones, ur: NEGATIVE mg/dL
Leukocytes, UA: NEGATIVE
Nitrite: NEGATIVE
Protein, ur: >=300 mg/dL — AB
Specific Gravity, Urine: >=1.03 (ref 1.005–1.030)
Urobilinogen, UA: 1 mg/dL (ref 0.0–1.0)
pH: 6 (ref 5.0–8.0)

## 2015-03-12 LAB — OB RESULTS CONSOLE GBS: GBS: POSITIVE

## 2015-03-12 LAB — TYPE AND SCREEN
ABO/RH(D): A POS
Antibody Screen: NEGATIVE

## 2015-03-12 LAB — ABO/RH: ABO/RH(D): A POS

## 2015-03-12 MED ORDER — OXYTOCIN BOLUS FROM INFUSION
500.0000 mL | INTRAVENOUS | Status: DC
  Administered 2015-03-12: 500 mL via INTRAVENOUS

## 2015-03-12 MED ORDER — PENICILLIN G POTASSIUM 5000000 UNITS IJ SOLR
5.0000 10*6.[IU] | Freq: Once | INTRAVENOUS | Status: AC
  Administered 2015-03-12: 5 10*6.[IU] via INTRAVENOUS
  Filled 2015-03-12: qty 5

## 2015-03-12 MED ORDER — LACTATED RINGERS IV SOLN
INTRAVENOUS | Status: DC
  Administered 2015-03-12 (×2): via INTRAVENOUS

## 2015-03-12 MED ORDER — DIPHENHYDRAMINE HCL 50 MG/ML IJ SOLN: 12.5000 mg | INTRAMUSCULAR | Status: DC | PRN

## 2015-03-12 MED ORDER — CITRIC ACID-SODIUM CITRATE 334-500 MG/5ML PO SOLN: 30.0000 mL | ORAL | Status: DC | PRN

## 2015-03-12 MED ORDER — EPHEDRINE 5 MG/ML INJ: 10.0000 mg | INTRAVENOUS | Status: DC | PRN

## 2015-03-12 MED ORDER — LACTATED RINGERS IV SOLN: 500.0000 mL | INTRAVENOUS | Status: DC | PRN

## 2015-03-12 MED ORDER — FENTANYL 2.5 MCG/ML BUPIVACAINE 1/10 % EPIDURAL INFUSION (WH - ANES)
14.0000 mL/h | INTRAMUSCULAR | Status: DC | PRN
  Filled 2015-03-12: qty 125

## 2015-03-12 MED ORDER — PENICILLIN G POTASSIUM 5000000 UNITS IJ SOLR
2.5000 10*6.[IU] | INTRAVENOUS | Status: DC
  Filled 2015-03-12 (×5): qty 2.5

## 2015-03-12 MED ORDER — OXYCODONE-ACETAMINOPHEN 5-325 MG PO TABS: 2.0000 | ORAL_TABLET | ORAL | Status: DC | PRN

## 2015-03-12 MED ORDER — PHENYLEPHRINE 40 MCG/ML (10ML) SYRINGE FOR IV PUSH (FOR BLOOD PRESSURE SUPPORT)
80.0000 ug | PREFILLED_SYRINGE | INTRAVENOUS | Status: DC | PRN
Start: 2015-03-12 — End: 2015-03-12

## 2015-03-12 MED ORDER — TERBUTALINE SULFATE 1 MG/ML IJ SOLN
0.2500 mg | Freq: Once | INTRAMUSCULAR | Status: DC | PRN
  Filled 2015-03-12: qty 1

## 2015-03-12 MED ORDER — KETOROLAC TROMETHAMINE 30 MG/ML IJ SOLN: 30.0000 mg | Freq: Four times a day (QID) | INTRAMUSCULAR | Status: DC | PRN

## 2015-03-12 MED ORDER — OXYTOCIN 10 UNIT/ML IJ SOLN
2.5000 [IU]/h | INTRAVENOUS | Status: DC
  Administered 2015-03-12: 2.5 [IU]/h via INTRAVENOUS

## 2015-03-12 MED ORDER — LACTATED RINGERS IV SOLN: 500.0000 mL | Freq: Once | INTRAVENOUS | Status: DC

## 2015-03-12 MED ORDER — LIDOCAINE HCL (PF) 1 % IJ SOLN
30.0000 mL | INTRAMUSCULAR | Status: AC | PRN
  Administered 2015-03-12: 30 mL via SUBCUTANEOUS
  Filled 2015-03-12: qty 30

## 2015-03-12 MED ORDER — PHENYLEPHRINE 40 MCG/ML (10ML) SYRINGE FOR IV PUSH (FOR BLOOD PRESSURE SUPPORT)
80.0000 ug | PREFILLED_SYRINGE | INTRAVENOUS | Status: DC | PRN
  Filled 2015-03-12: qty 20
  Filled 2015-03-12: qty 2

## 2015-03-12 MED ORDER — FENTANYL CITRATE (PF) 100 MCG/2ML IJ SOLN
100.0000 ug | INTRAMUSCULAR | Status: DC | PRN
  Administered 2015-03-12: 100 ug via INTRAVENOUS
  Filled 2015-03-12: qty 2

## 2015-03-12 MED ORDER — OXYTOCIN 10 UNIT/ML IJ SOLN
1.0000 m[IU]/min | INTRAVENOUS | Status: DC
  Administered 2015-03-12: 2 m[IU]/min via INTRAVENOUS
  Filled 2015-03-12: qty 4

## 2015-03-12 MED ORDER — OXYCODONE-ACETAMINOPHEN 5-325 MG PO TABS: 1.0000 | ORAL_TABLET | ORAL | Status: DC | PRN

## 2015-03-12 MED ORDER — LIDOCAINE HCL (PF) 1 % IJ SOLN
INTRAMUSCULAR | Status: DC | PRN
  Administered 2015-03-12 (×2): 4 mL via EPIDURAL

## 2015-03-12 MED ORDER — ACETAMINOPHEN 325 MG PO TABS: 650.0000 mg | ORAL_TABLET | ORAL | Status: DC | PRN

## 2015-03-12 MED ORDER — ONDANSETRON HCL 4 MG/2ML IJ SOLN: 4.0000 mg | Freq: Four times a day (QID) | INTRAMUSCULAR | Status: DC | PRN

## 2015-03-12 MED ORDER — EPHEDRINE 5 MG/ML INJ
10.0000 mg | INTRAVENOUS | Status: DC | PRN
  Filled 2015-03-12: qty 2

## 2015-03-12 NOTE — Progress Notes (Signed)
Subjective:  Megan Webb is a 20 y.o. G1P0000 at [redacted]w[redacted]d being seen today for ongoing prenatal care.  She is currently monitored for the following issues for this low-risk pregnancy and has Benzodiazepine (tranquilizer) overdose; Somnolence; Suicide attempt by drug ingestion ([place]); Recurrent major depression-severe ([place]); Anxiety; Cannabis abuse; Supervision of high-risk pregnancy; Abnormal glucose tolerance in pregnancy; and GBS bacteriuria on her problem list.  Patient reports no complaints.  Contractions: Irritability. Vag. Bleeding: None.  Movement: Present. Denies leaking of fluid.   The following portions of the patient's history were reviewed and updated as appropriate: allergies, current medications, past family history, past medical history, past social history, past surgical history and problem list. Problem list updated.  Objective:   Filed Vitals:   03/12/15 1534 03/12/15 1539  BP: 145/87 139/92  Pulse: 66   Temp: 97.7 F (36.5 C)   Weight: 177 lb (80.287 kg)     Fetal Status: Fetal Heart Rate (bpm): 159   Movement: Present     General:  Alert, oriented and cooperative. Patient is in no acute distress.  Skin: Skin is warm and dry. No rash noted.   Cardiovascular: Normal heart rate noted  Respiratory: Normal respiratory effort, no problems with respiration noted  Abdomen: Soft, gravid, appropriate for gestational age. Pain/Pressure: Present     Pelvic: Vag. Bleeding: None     Cervical exam deferred        Extremities: Normal range of motion.  Edema: Moderate pitting, indentation subsides rapidly  Mental Status: Normal mood and affect. Normal behavior. Normal judgment and thought content.   Urinalysis: Urine Protein: 4+ Urine Glucose: Negative  Assessment and Plan:  Pregnancy: G1P0000 at [redacted]w[redacted]d  1. Supervision of high-risk pregnancy, third trimester Patient with 2 elevated BP. Patient is asymptomatic as she denies headaches, visual disturbances, RUQ/epigastic  pain All questions regarding inductions of labor were answered. Patient verbalized understanding She remains undecided on contraception 24 hour urine protein results pending  Term labor symptoms and general obstetric precautions including but not limited to vaginal bleeding, contractions, leaking of fluid and fetal movement were reviewed in detail with the patient. Please refer to After Visit Summary for other counseling recommendations.  Return in about 1 week (around 03/19/2015).   Mora Bellman, MD

## 2015-03-12 NOTE — Consults (Signed)
  Anesthesia Pain Consult Note  Patient: Megan Webb, 20 y.o., female  Consult Requested by: Mora Bellman, MD  Reason for Consult: CRNA pain rounding  Level of Consciousness: alert  Pain: 0, pain goal 6, open to epidural     [facility] 03/12/2015

## 2015-03-12 NOTE — Anesthesia Procedure Notes (Signed)
Epidural Patient location during procedure: OB Start time: 03/12/2015 9:00 PM  Staffing Anesthesiologist: Naraly Fritcher  Preanesthetic Checklist Completed: patient identified, site marked, surgical consent, pre-op evaluation, timeout performed, IV checked, risks and benefits discussed, monitors and equipment checked and at surgeon's request  Epidural Patient position: sitting Prep: DuraPrep Patient monitoring: continuous pulse ox, blood pressure and heart rate Approach: midline Location: L3-L4 Injection technique: LOR saline  Needle:  Needle type: Tuohy  Needle gauge: 17 G Needle length: 9 cm Needle insertion depth: 6 cm Catheter type: closed end flexible Test dose: negative  Additional Notes Reason for block:at surgeon's request

## 2015-03-12 NOTE — Anesthesia Preprocedure Evaluation (Signed)
Anesthesia Evaluation  Patient identified by MRN, date of birth, ID band Patient awake    Reviewed: Allergy & Precautions, NPO status , Patient's Chart, lab work & pertinent test results  Airway Mallampati: I  TM Distance: >3 FB Neck ROM: Full    Dental  (+) Teeth Intact, Dental Advisory Given   Pulmonary    breath sounds clear to auscultation       Cardiovascular hypertension,  Rhythm:Regular Rate:Normal     Neuro/Psych    GI/Hepatic   Endo/Other    Renal/GU      Musculoskeletal   Abdominal   Peds  Hematology   Anesthesia Other Findings   Reproductive/Obstetrics                             Anesthesia Physical Anesthesia Plan  ASA: II  Anesthesia Plan: Epidural   Post-op Pain Management:    Induction:   Airway Management Planned: Natural Airway  Additional Equipment:   Intra-op Plan:   Post-operative Plan:   Informed Consent: I have reviewed the patients History and Physical, chart, labs and discussed the procedure including the risks, benefits and alternatives for the proposed anesthesia with the patient or authorized representative who has indicated his/her understanding and acceptance.     Plan Discussed with: CRNA, Anesthesiologist and Surgeon  Anesthesia Plan Comments:         Anesthesia Quick Evaluation

## 2015-03-12 NOTE — H&P (Signed)
LABOR ADMISSION HISTORY AND PHYSICAL  Lynisha Craze is a 20 y.o. female G1P0000 with IUP at [redacted]w[redacted]d by u/s @ [redacted]w[redacted]d presenting for IOL for gHTN and is GBS+. Had elevated BPs at today's and last week's [facility] visit to 146/77 and 145/87. On repeat today was 137/92. Found to have protein on u/a 300+ today and 100 last week. She reports +FM, +intermittent contractions, +RUQ starting minutes ago, and worsening peripheral edema x1 month in feet and calves. Calves sometimes hurt from swelling in lower legs and feet. Denies calf erythema or SOB. No LOF, no VB, no blurry vision, or headaches. peripheral edema, and RUQ pain. Having baby girl. She plans on breast feeding. She is undecided about birth control method and has never used any method.   Initiated prenatal care with Dr. Jac Canavan of [facility] and switched to [facility] because it's closer to her home.    Dating: By u/s @ 11wk1d --->  Estimated Date of Delivery: 03/25/15  Sono:    @[redacted]w[redacted]d , normal anatomy, 283g, 35% EFW   Prenatal History/Complications:  Past Medical History: Past Medical History  Diagnosis Date  . 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 Cyndie Chime records, patient does not recall this Suicide attempt in 2013 per Epic problem list Depression and anxiety per Epic problem list  Past Surgical History: Past Surgical History  Procedure Laterality Date  . Bunionectomy    . Bunionectomy      Obstetrical History: OB History    Gravida Para Term Preterm AB TAB SAB Ectopic Multiple Living   1 0 0 0 0 0 0 0 0 0       Social History: Social History   Social History  . Marital Status: Single    Spouse Name: N/A  . Number of Children: N/A  . Years of Education: N/A   Social History Main Topics  . Smoking status: Never Smoker   . Smokeless tobacco: Not on file  . Alcohol Use: No  . Drug Use: 1.00 per week    Special: Marijuana  . Sexual Activity: Yes    Birth Control/ Protection: None   Other Topics Concern  .  Not on file   Social History Narrative  Lives with boyfriend. Works as [employer]. Denies alcohol use during pregnancy (has used in the past). Denies current marijuana, cocaine, benzo, and heroin use.  Family History: Family History  Problem Relation Age of Onset  . Mental illness Mother     anxiety and depression  . Hypertension Mother   . Kidney disease Mother   . Anemia Mother   . Cataracts Father   . Asthma Brother   . Diabetes Maternal Aunt   . Cancer Maternal Aunt     breast  . Diabetes Maternal Uncle   . Arthritis Maternal Grandmother   . Stroke Maternal Grandmother   . Hypertension Maternal Grandmother   . Arthritis Maternal Grandfather   . Hypertension Maternal Grandfather   . Stroke Maternal Grandfather   . Arthritis Paternal Grandfather   . Stroke Paternal Grandfather     Allergies: No Known Allergies  Prescriptions prior to admission  Medication Sig Dispense Refill Last Dose  . Prenatal Vit-Fe Fumarate-FA (PRENATAL VITAMINS) 28-0.8 MG TABS Take 1 tablet by mouth daily. 30 tablet 11 03/12/2015 at Unknown time     Review of Systems   All systems reviewed and negative except as stated in HPI  BP 137/92 mmHg  Pulse 69  Temp(Src) 98.4 F (36.9 C) (Oral)  Resp 18  Ht 5\' 3"  (1.6 m)  Wt 80.287 kg (177 lb)  BMI 31.36 kg/m2  LMP 06/03/2014 General appearance: alert, cooperative and no distress, sitting comfortably in bed, grimaces during CTX Lungs: clear to auscultation bilaterally Heart: regular rate and rhythm, no m/r/g Abdomen: soft,  Pelvic: not assessed Extremities: no tenderness, swelling, or erythema in calves. Legs of equal size. 1-2+ pitting edema pretibial and in feet bilaterally DTR's: 2+ patellar, 1+ brachioradialis Presentation: not assessed Fetal monitoringBaseline: 145 bpm, Variability: Good {> 6 bpm), Accelerations: Reactive and Decelerations: Absent Uterine activityDuration: q25min  Dilation: 4.5 Effacement (%): 90 Station:  -1 Exam by:: E. Foley, RN   Prenatal labs: ABO, Rh: --/--/A POS (02/15 1635) Antibody: NEG (02/15 1635) Rubella: Immune (08/29/14) RPR: NON REAC (12/14 1056)  HBsAg: Negative (08/03 0000)  HIV: NONREACTIVE (12/14 1056)  GBS: Positive (02/15 0000)  1 hr Glucola - 170 (abn) on 11/29/14, normal 3 hr on 12/06/14  Genetic screening  - normal quad screen Anatomy US - normal (see records from Dr. Jac Canavan under media)  Prenatal Transfer Tool  Maternal Diabetes: No Genetic Screening: Normal Maternal Ultrasounds/Referrals: Normal Fetal Ultrasounds or other Referrals:  None Maternal Substance Abuse:  No, history of benzo use and past marijuana use. Negative UDS during pregnancy. Significant Maternal Medications:  None Significant Maternal Lab Results: Lab values include: Group B Strep positive  Results for orders placed or performed during the hospital encounter of 03/12/15 (from the past 24 hour(s))  OB RESULT CONSOLE Group B Strep   Collection Time: 03/12/15 12:00 AM  Result Value Ref Range   GBS Positive   CBC   Collection Time: 03/12/15  4:35 PM  Result Value Ref Range   WBC 7.4 4.0 - 10.5 K/uL   RBC 3.90 3.87 - 5.11 MIL/uL   Hemoglobin 10.1 (L) 12.0 - 15.0 g/dL   HCT 31.1 (L) 36.0 - 46.0 %   MCV 79.7 78.0 - 100.0 fL   MCH 25.9 (L) 26.0 - 34.0 pg   MCHC 32.5 30.0 - 36.0 g/dL   RDW 17.0 (H) 11.5 - 15.5 %   Platelets 185 150 - 400 K/uL  Type and screen [place]   Collection Time: 03/12/15  4:35 PM  Result Value Ref Range   ABO/RH(D) A POS    Antibody Screen NEG    Sample Expiration 03/15/2015   Results for orders placed or performed in visit on 03/12/15 (from the past 24 hour(s))  POCT urinalysis dip (device)   Collection Time: 03/12/15  3:36 PM  Result Value Ref Range   Glucose, UA NEGATIVE NEGATIVE mg/dL   Bilirubin Urine NEGATIVE NEGATIVE   Ketones, ur NEGATIVE NEGATIVE mg/dL   Specific Gravity, Urine >=1.030 1.005 - 1.030   Hgb urine dipstick  NEGATIVE NEGATIVE   pH 6.0 5.0 - 8.0   Protein, ur >=300 (A) NEGATIVE mg/dL   Urobilinogen, UA 1.0 0.0 - 1.0 mg/dL   Nitrite NEGATIVE NEGATIVE   Leukocytes, UA NEGATIVE NEGATIVE    Patient Active Problem List   Diagnosis Date Noted  . Gestational hypertension 03/12/2015  . GBS bacteriuria 02/02/2015  . Abnormal glucose tolerance in pregnancy 12/31/2014  . Supervision of high-risk pregnancy 12/11/2014  . Cannabis abuse 08/16/2011  . Suicide attempt by drug ingestion ([place]) 08/15/2011  . Recurrent major depression-severe ([place]) 08/15/2011  . Anxiety 08/15/2011  . Benzodiazepine (tranquilizer) overdose 08/14/2011  . Somnolence 08/14/2011    Assessment: Juantia Holford is a 20 y.o. G1P0000 at [redacted]w[redacted]d  here for new-onset IOL for gHTN and is GBS+. Patient already dilated to 4.5 cm. Pre-eclampsia labs ordered as patient admitted for IOL 2/2 gHTN and complains of new onset RUQ pain. Will not start magnesium at this time or initiate anti-hypertensives as BPs have been <160/110.  #Labor: Start pitocin, normal induction care, anticipate SVD #Pain: Epidural prn #FWB: Category I #ID:  GBS+, start penicillin #MOF: breastfeeding #MOC: Undecided. Discussed LARC. Continue to provide info and handouts. # Discharge planning: Discuss mental health with patient when alone in room. Pt did not mention history of depression or suicide attempt during history with partner present. Counsel about postpartum depression. Recommend 2-week postpartum follow-up.  # Anemia: D/c with iron supplementation (Hg 10.1)  Austin Miles, MS3 helped compile and obtain this history.   OB fellow attestation: I have seen and examined this patient; I agree with above documentation in the medical students's note. I have edited and updated all of the components.  Danessa Becknell is a 20 y.o. G1P0000 here for IOL for gHTN  PE: BP 137/92 mmHg  Pulse 69  Temp(Src) 98.4 F (36.9 C) (Oral)  Resp 18  Ht 5\' 3"   (1.6 m)  Wt 177 lb (80.287 kg)  BMI 31.36 kg/m2  LMP 06/03/2014 Gen: calm comfortable, NAD Resp: normal effort, no distress Abd: gravid  ROS, labs, PMH reviewed  Plan: Admit to LD for IOL 2/2 to gHTN at term Labor: favorable for pitocin with bishop score 8. Start pit 2 by 2 FWB: Cat I ID: GBS pos, start PCN  Mood: Doing well today. Plan for baby lover involvement given risk of pp mood disorder   Caren Macadam, MD  Family Medicine, OB Fellow 03/12/2015, 8:09 PM

## 2015-03-13 ENCOUNTER — Encounter (HOSPITAL_COMMUNITY): Payer: Self-pay | Admitting: *Deleted

## 2015-03-13 LAB — CBC
HCT: 25.8 % — ABNORMAL LOW (ref 36.0–46.0)
Hemoglobin: 8.4 g/dL — ABNORMAL LOW (ref 12.0–15.0)
MCH: 26.1 pg (ref 26.0–34.0)
MCHC: 32.6 g/dL (ref 30.0–36.0)
MCV: 80.1 fL (ref 78.0–100.0)
Platelets: 167 10*3/uL (ref 150–400)
RBC: 3.22 MIL/uL — ABNORMAL LOW (ref 3.87–5.11)
RDW: 17.2 % — ABNORMAL HIGH (ref 11.5–15.5)
WBC: 11 10*3/uL — ABNORMAL HIGH (ref 4.0–10.5)

## 2015-03-13 LAB — RPR: RPR Ser Ql: NONREACTIVE

## 2015-03-13 MED ORDER — LANOLIN HYDROUS EX OINT: TOPICAL_OINTMENT | CUTANEOUS | Status: DC | PRN

## 2015-03-13 MED ORDER — PRENATAL MULTIVITAMIN CH
1.0000 | ORAL_TABLET | Freq: Every day | ORAL | Status: DC
  Administered 2015-03-13 – 2015-03-14 (×2): 1 via ORAL
  Filled 2015-03-13 (×2): qty 1

## 2015-03-13 MED ORDER — DIPHENHYDRAMINE HCL 25 MG PO CAPS: 25.0000 mg | ORAL_CAPSULE | Freq: Four times a day (QID) | ORAL | Status: DC | PRN

## 2015-03-13 MED ORDER — ACETAMINOPHEN 325 MG PO TABS: 650.0000 mg | ORAL_TABLET | ORAL | Status: DC | PRN

## 2015-03-13 MED ORDER — IBUPROFEN 600 MG PO TABS
600.0000 mg | ORAL_TABLET | Freq: Four times a day (QID) | ORAL | Status: DC
  Administered 2015-03-13 – 2015-03-14 (×8): 600 mg via ORAL
  Filled 2015-03-13 (×8): qty 1

## 2015-03-13 MED ORDER — ZOLPIDEM TARTRATE 5 MG PO TABS: 5.0000 mg | ORAL_TABLET | Freq: Every evening | ORAL | Status: DC | PRN

## 2015-03-13 MED ORDER — DIBUCAINE 1 % RE OINT: 1.0000 "application " | TOPICAL_OINTMENT | RECTAL | Status: DC | PRN

## 2015-03-13 MED ORDER — TETANUS-DIPHTH-ACELL PERTUSSIS 5-2.5-18.5 LF-MCG/0.5 IM SUSP: 0.5000 mL | Freq: Once | INTRAMUSCULAR | Status: DC

## 2015-03-13 MED ORDER — ONDANSETRON HCL 4 MG PO TABS: 4.0000 mg | ORAL_TABLET | ORAL | Status: DC | PRN

## 2015-03-13 MED ORDER — SENNOSIDES-DOCUSATE SODIUM 8.6-50 MG PO TABS
2.0000 | ORAL_TABLET | ORAL | Status: DC
  Administered 2015-03-13: 2 via ORAL
  Filled 2015-03-13: qty 2

## 2015-03-13 MED ORDER — ONDANSETRON HCL 4 MG/2ML IJ SOLN: 4.0000 mg | INTRAMUSCULAR | Status: DC | PRN

## 2015-03-13 MED ORDER — BENZOCAINE-MENTHOL 20-0.5 % EX AERO: 1.0000 "application " | INHALATION_SPRAY | CUTANEOUS | Status: DC | PRN

## 2015-03-13 MED ORDER — WITCH HAZEL-GLYCERIN EX PADS: 1.0000 "application " | MEDICATED_PAD | CUTANEOUS | Status: DC | PRN

## 2015-03-13 MED ORDER — SIMETHICONE 80 MG PO CHEW
80.0000 mg | CHEWABLE_TABLET | ORAL | Status: DC | PRN
  Administered 2015-03-14: 80 mg via ORAL
  Filled 2015-03-13: qty 1

## 2015-03-13 NOTE — Discharge Instructions (Signed)

## 2015-03-13 NOTE — Progress Notes (Signed)
Patient ambulated to bathroom, voided, performed peri care and ambulated back to bed. No complaints at this time.

## 2015-03-13 NOTE — Discharge Summary (Signed)
OB Discharge Summary     Patient Name: Megan Webb DOB: 10/17/1995 MRN: [id number]  Date of admission: 03/12/2015 Delivering MD: Tania Ade H   Date of discharge: 03/14/2015  Admitting diagnosis: Induction 38.1w; was already in early labor Intrauterine pregnancy: [redacted]w[redacted]d     Secondary diagnosis:  Active Problems:   Gestational hypertension  Additional problems: preeclampsia w/mild features     Discharge diagnosis: Term Pregnancy Delivered and Preeclampsia (mild)                                                                                                Post partum procedures:none  Augmentation: Pitocin  Complications: None  Hospital course:  Onset of Labor With Vaginal Delivery     20 y.o. yo G1P1001 at [redacted]w[redacted]d was admitted in Latent Labor on 03/12/2015. Patient had an uncomplicated labor course as follows:  Membrane Rupture Time/Date: 8:00 PM ,03/12/2015   Intrapartum Procedures: Episiotomy: None [1]                                         Lacerations:  Periurethral [8]  Patient had a delivery of a Viable infant. 03/12/2015  Information for the patient's newborn:  Marveen Reeks Girl Estera [id number]  Delivery Method: Vaginal, Spontaneous Delivery (Filed from Delivery Summary)     Pateint had an uncomplicated postpartum course.  She is ambulating, tolerating a regular diet, passing flatus, and urinating well. Patient is discharged home in stable condition on 03/14/15.    Physical exam  Filed Vitals:   03/13/15 0149 03/13/15 0550 03/13/15 1815 03/14/15 0500  BP: 130/80 133/82 120/80 129/83  Pulse: 87 84 85 73  Temp: 98.6 F (37 C) 98.4 F (36.9 C) 98.2 F (36.8 C) 98.4 F (36.9 C)  TempSrc: Oral Oral Oral Oral  Resp: 16 16 18 18   Height:      Weight:      SpO2:   98%    General: alert, cooperative and no distress Lochia: appropriate Uterine Fundus: firm Abdomen:  Has some abdominal wall pain along stretch mark, tender to light palpation  since before delivery==may try ice Incision: N/A DVT Evaluation: No evidence of DVT seen on physical exam. Labs: Lab Results  Component Value Date   WBC 11.0* 03/13/2015   HGB 8.4* 03/13/2015   HCT 25.8* 03/13/2015   MCV 80.1 03/13/2015   PLT 167 03/13/2015   CMP Latest Ref Rng 03/12/2015  Glucose 65 - 99 mg/dL 99  BUN 6 - 20 mg/dL 13  Creatinine 0.44 - 1.00 mg/dL 0.65  Sodium 135 - 145 mmol/L 136  Potassium 3.5 - 5.1 mmol/L 4.2  Chloride 101 - 111 mmol/L 111  CO2 22 - 32 mmol/L 18(L)  Calcium 8.9 - 10.3 mg/dL 8.8(L)  Total Protein 6.5 - 8.1 g/dL 6.0(L)  Total Bilirubin 0.3 - 1.2 mg/dL 0.2(L)  Alkaline Phos 38 - 126 U/L 239(H)  AST 15 - 41 U/L 23  ALT 14 - 54 U/L 12(L)    Discharge instruction: per After  Visit Summary and "Baby and Me Booklet".  After visit meds:    Medication List    TAKE these medications        ibuprofen 600 MG tablet  Commonly known as:  ADVIL,MOTRIN  Take 1 tablet (600 mg total) by mouth every 6 (six) hours.     Prenatal Vitamins 28-0.8 MG Tabs  Take 1 tablet by mouth daily.        Diet: routine diet  Activity: Advance as tolerated. Pelvic rest for 6 weeks.   Outpatient follow up:Baby Love RN to check BP 3-5 days Follow up Appt: Future Appointments Date Time Provider [place]  04/21/2015 2:00 PM Seabron Spates, CNM [facility]   Follow up Visit:No Follow-up on file.  Postpartum contraception: undecided but counseled  Newborn Data: Live born female  Birth Weight: 7 lb 4.6 oz (3305 g) APGAR: 8, 9  Baby Feeding: Breast Disposition:home with mother   03/14/2015 Wyvonnia Dusky, CNM

## 2015-03-13 NOTE — Lactation Note (Signed)
This note was copied from a baby's chart. Lactation Consultation Note New mom has flat affect, doesn't engage long eye contact. Discussed how BF was going and she stated fine. Asked if she would like LC to assist her latch at this time, stated no, she'd not hungry now, sleeping.  Mom encouraged to feed baby 8-12 times/24 hours and with feeding cues. Referred to Baby and Me Book in Breastfeeding section Pg. 22-23 for position options and Proper latch demonstration. Educated about newborn behavior, STS, I&O, cluster feeding, supply and demand.  [place] brochure given w/resources, support groups and [place] services. Mom has perky breast w/small nipples. Easily expressed colostrum taught to mom. Mom demonstrated expression colostrum. When left, smiled and said thank you.  Patient Name: Megan Webb [id number] Date: 03/13/2015 Reason for consult: Initial assessment   Maternal Data Has patient been taught Hand Expression?: Yes Does the patient have breastfeeding experience prior to this delivery?: No  Feeding Feeding Type: Breast Fed Length of feed: 15 min  LATCH Score/Interventions       Type of Nipple: Everted at rest and after stimulation (short shaft small nipples)  Comfort (Breast/Nipple): Soft / non-tender           Lactation Tools Discussed/Used     Consult Status Consult Status: Follow-up Date: 03/14/15 Follow-up type: In-patient    Theodoro Kalata 03/13/2015, 6:42 AM

## 2015-03-13 NOTE — Progress Notes (Signed)
POSTPARTUM PROGRESS NOTE  Post Partum Day 1 Subjective:  Megan Webb is a 20 y.o. G1P1001 [redacted]w[redacted]d s/p SVD following IOL for new-onset gHTN and GBS+ (inadequate treatment) and 825 mL EBL for which she received a pitocin bolus.  No acute events overnight.  Pt denies problems with ambulating, voiding or po intake.  She denies nausea or vomiting.  Pain is moderately controlled and notes suprapubic pain.  She has had flatus. She has not had bowel movement.  Lochia moderate to large with significant blood on pads. She denies light-headedness, dizziness, and SOB.  Objective: Blood pressure 133/82, pulse 84, temperature 98.4 F (36.9 C), temperature source Oral, resp. rate 16, height 5\' 3"  (1.6 m), weight 80.287 kg (177 lb), last menstrual period 06/03/2014, SpO2 98 %, unknown if currently breastfeeding.  Physical Exam:  General: alert, cooperative and no distress Heart: RRR no m/r/g Abdomen: +BS, soft, nontender (see below)  Uterine Fundus: ~level of umbilicus, boggy-to-firm, tender midline and on R DVT Evaluation: No calf tenderness Extremities: 1+ pretibial pitting edema, non-pitting edema in feet, 2+dorsalis pedis Psych: Flat affect this AM compared to affect at admission   Recent Labs  03/12/15 1635 03/13/15 0850  HGB 10.1* 8.4*  HCT 31.1* 25.8*    Assessment/Plan:  ASSESSMENT: Megan Webb is a 20 y.o. G1P1001 [redacted]w[redacted]d s/p SVD following IOL for new-onset gHTN and GBS+ (inadequate treatment) and 825 mL EBL. Pt has stable vitals and is asymptomatic. Will check a morning CBC.  Pt also has history of depression and SA 4 years ago, about which patient did not volunteer information at admission. Not currently on medication.  -Likely d/c tomorrow (late delivery and GBS+ with inadequate treatment) -Breastfeeding -Social Work consult given risk of PPD -Contraception - undecided. Has never been on contraception. Continue to discuss and provide information prior to  d/c -Mental health - Recommend postpartum f/u in 2 wks -Anemia - CBC today given elevated EBL. D/c with iron supplementation   LOS: 1 day   Austin Miles 03/13/2015, 9:03 AM   OB fellow attestation:  I have seen and examined this patient; I agree with above documentation in the Medical Student's note.   Megan Webb is a 20 y.o. G1P1001 PPD#1 SVD reporting perineal soreness, concern for blood loss. Denies dizziness.   PE: BP 133/82 mmHg  Pulse 84  Temp(Src) 98.4 F (36.9 C) (Oral)  Resp 16  Ht 5\' 3"  (1.6 m)  Wt 177 lb (80.287 kg)  BMI 31.36 kg/m2  SpO2 98%  LMP 06/03/2014  Breastfeeding? Yes. Latching well. Gen: calm comfortable, NAD. No pallor. Resp: normal effort, no distress Abd: Soft. Fundus U/2  CBC Latest Ref Rng 03/13/2015 03/12/2015 03/11/2015  WBC 4.0 - 10.5 K/uL 11.0(H) 7.4 6.3  Hemoglobin 12.0 - 15.0 g/dL 8.4(L) 10.1(L) 10.1(L)  Hematocrit 36.0 - 46.0 % 25.8(L) 31.1(L) 30.9(L)  Platelets 150 - 400 K/uL 167 185 178   Plan: Plan D/C in am CBC in am if further concern for excessive blood loss. FeS04 SW consult  Daeton Kluth, CNM 11:02 AM

## 2015-03-13 NOTE — Clinical Social Work Maternal (Signed)
CLINICAL SOCIAL WORK MATERNAL/CHILD NOTE  Patient Details  Name: Megan Webb MRN: [id number] Date of Birth: 05/27/1995  Date:  03/13/2015  Clinical Social Worker Initiating Note:  Lucita Ferrara MSW, LCSW Date/ Time Initiated:  03/13/15/1220    Child's Name:  Megan Webb   Legal Guardian:  Mother   Need for Interpreter:  None   Date of Referral:  03/12/15     Reason for Referral:  Hx of depression/anxiety, Current Substance Use/Substance Use During Pregnancy [facility])   Referral Source:  [facility]   Address:  [place], [place] [address]  Phone number:  [phone]   Household Members:  Significant Other   Natural Supports (not living in the home):  Extended Family, Immediate Family   Professional Supports: None   Employment: Full-time   Type of Work: call center   Education:    Engineer, civil (consulting) Resources:  [employer], Multimedia programmer   Other Resources:    None identified   Cultural/Religious Considerations Which May Impact Care:  None reported  Strengths:  Ability to meet basic needs , Home prepared for child    Risk Factors/Current Problems:   1.Mental Health Concerns: MOB presents with a history of anxiety, depression, with attempted overdose in 2013. MOB denied any mental health concerns during this pregnancy.  2. 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 MOB denied any THC use during the pregnancy.   Cognitive State:  Able to Concentrate , Alert , Goal Oriented , Linear Thinking    Mood/Affect:  Happy , Comfortable , Calm    CSW Assessment:  CSW received request for consult due to MOB presenting with a history of anxiety, depression, and THC use.  FOB was also present in the room, but he was observed to be sleeping soundly in the room.  MOB was easily engaged and receptive to the visit. She was observed to by lying on her side, displayed a full range in affect, and presented in a pleasant mood. CSW  reviewed initial Jenera note with concerns of a flattened affect, but CSW did not note a concerning affect during the assessment.   MOB was receptive to processing and reflecting upon her childbirth experience and her transition postpartum. She stated that she is tired, exhausted, and a painful childbirth experience, but expressed feeling content and happy with her experience thus far. She endorsed feeling "ready" to transition home, and shared that she lives with the FOB.  She stated that the FOB works numerous jobs, will be gone frequently, and will be at home by herself for large portions of the day. She stated that she feels "prepared" for this, and reported that she knows that her mother and other members of her support system will be available if needed. She shared that she will be hesitant to contact her mother since her mother is on dialysis, but she recognized that her mother wants to be involved and supportive of her.  MOB confirmed that the home is prepared for the infant, and stated that she will be taking 6 weeks off of work.  She shared that she had to contact her boss today to inform her of the infant's birth since the infant was born early, but shared that her boss was supportive.   MOB denied mental health complications during the pregnancy. MOB confirmed history of depression and anxiety, but stated that she has felt that depression and anxiety have been a presenting problem or concern since 2013.  MOB confirmed that she was hospitalized at [facility] for one week in 2013 due to an attempted overdose. She stated that she still felt "depressed" after the admission, but reported that she was not prescribed medication nor did she participate in therapy.  MOB shared that she felt an improvement in symptoms as she started to go to work and attend school, and discussed impressions that distracting herself and creating a sense of purposed assisted her mental health.  MOB again denied any depression or anxiety  during the pregnancy.   CSW provided education on the [facility] and perinatal mood and anxiety disorders. MOB verbalized understanding, and denied questions or concerns about the education. She was receptive to exploring strategies to support her mental health during this time period, and agreed to contact her medical provider if she notes onset of symptoms.  MOB stated that she did feel "bad" on 2/15 since the infant was crying and she could not identify why.  MOB recognized that infants sometimes cry, and was receptive to exploring cognitive techniques to assist her to cope with their crying and to remind herself that parenting takes time to learn and that she is a good mother.   MOB denied THC use during the pregnancy, and reported last use 1 year ago. MOB verbalized understanding of the hospital drug screen policy, and denied questions or concerns about the infant's pending toxicology screens.   MOB acknowledged ongoing availability of CSW, expressed appreciation for the visit, and agreed to contact CSW if needs arise.   CSW Plan/Description:   1. Patient/Family Education-- Perinatal mood and anxiety disorders 2. CSW to monitor infant's toxicology screens, and will refer to CPS if positive. 3. No Further Intervention Required/No Barriers to Discharge    Sheilah Mins, LCSW 03/13/2015, 1:00 PM

## 2015-03-13 NOTE — Anesthesia Postprocedure Evaluation (Signed)
Anesthesia Post Note  Patient: Megan Webb  Procedure(s) Performed: * No procedures listed *  Patient location during evaluation: Mother Baby Anesthesia Type: Epidural Level of consciousness: awake and alert Pain management: pain level controlled Vital Signs Assessment: post-procedure vital signs reviewed and stable Respiratory status: spontaneous breathing Cardiovascular status: stable Postop Assessment: no headache, no backache, no signs of nausea or vomiting and adequate PO intake Anesthetic complications: no    Last Vitals:  Filed Vitals:   03/13/15 0149 03/13/15 0550  BP: 130/80 133/82  Pulse: 87 84  Temp: 37 C 36.9 C  Resp: 16 16    Last Pain:  Filed Vitals:   03/13/15 0615  PainSc: 0-No pain                 Larkin Alfred Hristova

## 2015-03-13 NOTE — Lactation Note (Signed)
This note was copied from a baby's chart. Lactation Consultation Note  Patient Name: Megan Webb [id number] Date: 03/13/2015 Reason for consult: Follow-up assessment Baby at 18 hr of life and mom reports that she has only eaten once in lifetime. Mom has soft breast with a short shaft nipple. Baby can latch but does not stay on for more than a minute. Applied #16 NS but baby fell asleep. Tried to wake baby but she was not interested in bf. Mom stated that she has tried manual expression once and got a few drops. She has a spoon, encouraged her to keep trying. Left baby sts and mom will offer the breast again in 1 hr. Mom will call for bf support as needed.    Maternal Data    Feeding Feeding Type: Breast Fed Length of feed: 0 min  LATCH Score/Interventions Latch: Too sleepy or reluctant, no latch achieved, no sucking elicited. Intervention(s): Skin to skin;Teach feeding cues;Waking techniques Intervention(s): Adjust position;Assist with latch;Breast compression  Audible Swallowing: None Intervention(s): Skin to skin;Hand expression  Type of Nipple: Everted at rest and after stimulation  Comfort (Breast/Nipple): Soft / non-tender     Hold (Positioning): Full assist, staff holds infant at breast Intervention(s): Support Pillows;Position options  LATCH Score: 4  Lactation Tools Discussed/Used WIC Program: Yes   Consult Status Consult Status: Follow-up Date: 03/14/15 Follow-up type: In-patient    Denzil Hughes 03/13/2015, 4:59 PM

## 2015-03-14 LAB — PROTEIN, URINE, 24 HOUR
Protein, 24H Urine: 1659 mg/24 h — ABNORMAL HIGH (ref <150)
Protein, Urine: 158 mg/dL — ABNORMAL HIGH (ref 5–24)

## 2015-03-14 MED ORDER — IBUPROFEN 600 MG PO TABS: 600.0000 mg | ORAL_TABLET | Freq: Four times a day (QID) | ORAL | Status: DC

## 2015-03-14 NOTE — Lactation Note (Signed)
This note was copied from a baby's chart. Lactation Consultation Note  Patient Name: Megan Webb [id number] Date: 03/14/2015 Reason for consult: Follow-up assessment;Breast/nipple pain   Follow up with mom . Infant with 8 BF for 11-20 minutes, 4 attempts, 3 voids and 4 stools in last 24 hours. LATCH Scores 4-7 by bedside RN's. Mom reports infant is latching better and the pain is decreased. Mom is using EBM to nipples post feeding.  Mom is a [facility] client and is aware to make F/U appt after d/c. Mom does not think she is going home today but is unsure. Infant with f/u Ped appt Monday. Enc mom to call for assistance as needed.    Maternal Data Formula Feeding for Exclusion: No  Feeding    LATCH Score/Interventions                      Lactation Tools Discussed/Used WIC Program: Yes   Consult Status Consult Status: Follow-up Date: 03/15/15 Follow-up type: In-patient    Debby Freiberg Hice 03/14/2015, 11:08 AM

## 2015-03-14 NOTE — Lactation Note (Signed)
This note was copied from a baby's chart. Lactation Consultation Note Mom c/o difficulty latching and having a lot of pain. When entered rm. Mom sitting up in bed w/baby BF cross cradle. Breast are filling. Areola bouncy. Nipples are tiny and flat, areola compressible for baby to latch to Rt. Nipple w/o pain. Mom states baby is cluster feeding and BF on Lt. Breast to long and it was hurting bad. Lt. Nipple not red, but appears to have a crack around the base of outer aspect of nipple. Comfort gels given. Mom wanted to know how could she tell if baby was getting anything. Encouraged mom to feel breast prior to BF then after BF to tell difference. Also out put as well. Mom had a more pleasant affect than previous night. Mom because very sleepy. Shells given to wear in bra today. Patient Name: Megan Webb [id number] Date: 03/14/2015     Maternal Data    Feeding Feeding Type: Breast Fed Length of feed: 20 min  LATCH Score/Interventions Latch: Grasps breast easily, tongue down, lips flanged, rhythmical sucking. Intervention(s): Teach feeding cues Intervention(s): Breast massage;Breast compression  Audible Swallowing: Spontaneous and intermittent Intervention(s): Hand expression Intervention(s): Alternate breast massage  Type of Nipple: Flat Intervention(s): Shells;Hand pump  Comfort (Breast/Nipple): Filling, red/small blisters or bruises, mild/mod discomfort  Problem noted: Mild/Moderate discomfort Interventions (Mild/moderate discomfort): Hand massage;Hand expression;Comfort gels  Hold (Positioning): Assistance needed to correctly position infant at breast and maintain latch. Intervention(s): Support Pillows;Position options;Skin to skin  LATCH Score: 7  Lactation Tools Discussed/Used     Consult Status      Theodoro Kalata 03/14/2015, 7:33 AM

## 2015-03-15 ENCOUNTER — Ambulatory Visit: Payer: Self-pay

## 2015-03-15 NOTE — Lactation Note (Signed)
This note was copied from a baby's chart. Lactation Consultation Note New mom states BF going much better and feels she is ready to go home. Mom isn't using NS any longer. Can latch baby to breast well w/o NS or pain, breast are filling. Discussed engorgement, prevention, management. Supply and demand, nipple care, and I&O. Reminded of resources and support groups. Patient Name: Megan Webb [id number] Date: 03/15/2015 Reason for consult: Follow-up assessment   Maternal Data    Feeding Feeding Type: Breast Fed Length of feed: 15 min  LATCH Score/Interventions Latch: Repeated attempts needed to sustain latch, nipple held in mouth throughout feeding, stimulation needed to elicit sucking reflex. Intervention(s): Skin to skin;Teach feeding cues;Waking techniques Intervention(s): Adjust position;Assist with latch;Breast massage;Breast compression  Audible Swallowing: A few with stimulation Intervention(s): Skin to skin;Hand expression Intervention(s): Skin to skin;Hand expression;Alternate breast massage  Type of Nipple: Everted at rest and after stimulation Intervention(s): Hand pump;Shells  Comfort (Breast/Nipple): Soft / non-tender  Interventions (Mild/moderate discomfort): Hand massage;Hand expression;Pre-pump if needed  Hold (Positioning): No assistance needed to correctly position infant at breast. Intervention(s): Support Pillows;Position options;Breastfeeding basics reviewed;Skin to skin  LATCH Score: 8  Lactation Tools Discussed/Used Tools: Pump Breast pump type: Manual Pump Review: Setup, frequency, and cleaning;Milk Storage Initiated by:: RN Date initiated:: 03/04/15   Consult Status Consult Status: Complete Date: 03/15/15    Theodoro Kalata 03/15/2015, 10:55 AM

## 2015-03-19 ENCOUNTER — Encounter: Payer: 59 | Admitting: Student

## 2015-03-21 ENCOUNTER — Encounter (HOSPITAL_COMMUNITY): Payer: Self-pay | Admitting: *Deleted

## 2015-03-21 ENCOUNTER — Inpatient Hospital Stay (HOSPITAL_COMMUNITY)
Admission: AD | Admit: 2015-03-21 | Discharge: 2015-03-21 | Disposition: A | Discharge status: Home / Self Care | Payer: 59 | Source: Ambulatory Visit | Attending: Family Medicine | Admitting: Family Medicine

## 2015-03-21 DIAGNOSIS — O135 Gestational [pregnancy-induced] hypertension without significant proteinuria, complicating the puerperium: Secondary | ICD-10-CM | POA: Diagnosis not present

## 2015-03-21 DIAGNOSIS — O1495 Unspecified pre-eclampsia, complicating the puerperium: Secondary | ICD-10-CM

## 2015-03-21 DIAGNOSIS — R030 Elevated blood-pressure reading, without diagnosis of hypertension: Secondary | ICD-10-CM | POA: Diagnosis present

## 2015-03-21 LAB — COMPREHENSIVE METABOLIC PANEL
ALT: 25 U/L (ref 14–54)
AST: 22 U/L (ref 15–41)
Albumin: 3.4 g/dL — ABNORMAL LOW (ref 3.5–5.0)
Alkaline Phosphatase: 133 U/L — ABNORMAL HIGH (ref 38–126)
Anion gap: 9 (ref 5–15)
BUN: 17 mg/dL (ref 6–20)
CO2: 21 mmol/L — ABNORMAL LOW (ref 22–32)
Calcium: 9.2 mg/dL (ref 8.9–10.3)
Chloride: 108 mmol/L (ref 101–111)
Creatinine, Ser: 0.58 mg/dL (ref 0.44–1.00)
GFR calc Af Amer: >60 mL/min (ref >60)
GFR calc non Af Amer: >60 mL/min (ref >60)
Glucose, Bld: 85 mg/dL (ref 65–99)
Potassium: 4 mmol/L (ref 3.5–5.1)
Sodium: 138 mmol/L (ref 135–145)
Total Bilirubin: 0.3 mg/dL (ref 0.3–1.2)
Total Protein: 7.1 g/dL (ref 6.5–8.1)

## 2015-03-21 LAB — CBC
HCT: 30.8 % — ABNORMAL LOW (ref 36.0–46.0)
Hemoglobin: 9.9 g/dL — ABNORMAL LOW (ref 12.0–15.0)
MCH: 26.2 pg (ref 26.0–34.0)
MCHC: 32.1 g/dL (ref 30.0–36.0)
MCV: 81.5 fL (ref 78.0–100.0)
Platelets: 405 10*3/uL — ABNORMAL HIGH (ref 150–400)
RBC: 3.78 MIL/uL — ABNORMAL LOW (ref 3.87–5.11)
RDW: 17.5 % — ABNORMAL HIGH (ref 11.5–15.5)
WBC: 8 10*3/uL (ref 4.0–10.5)

## 2015-03-21 LAB — PROTEIN / CREATININE RATIO, URINE
Creatinine, Urine: 55 mg/dL
Protein Creatinine Ratio: 0.47 mg/mg{Cre} — ABNORMAL HIGH (ref 0.00–0.15)
Total Protein, Urine: 26 mg/dL

## 2015-03-21 MED ORDER — ACETAMINOPHEN 325 MG PO TABS
650.0000 mg | ORAL_TABLET | Freq: Once | ORAL | Status: AC
  Administered 2015-03-21: 650 mg via ORAL
  Filled 2015-03-21: qty 2

## 2015-03-21 MED ORDER — AMLODIPINE BESYLATE 10 MG PO TABS: 10.0000 mg | ORAL_TABLET | Freq: Every day | ORAL | Status: DC

## 2015-03-21 MED ORDER — HYDRALAZINE HCL 20 MG/ML IJ SOLN: 10.0000 mg | Freq: Once | INTRAMUSCULAR | Status: DC | PRN

## 2015-03-21 MED ORDER — LABETALOL HCL 5 MG/ML IV SOLN: 20.0000 mg | INTRAVENOUS | Status: DC | PRN

## 2015-03-21 NOTE — Discharge Instructions (Signed)

## 2015-03-21 NOTE — MAU Note (Signed)
Unable to straight cath pt. Pt unable to tolerate further attemps

## 2015-03-21 NOTE — MAU Provider Note (Signed)
History     CSN: [id number]  Arrival date and time: 03/21/15 1554   First Provider Initiated Contact with Patient 03/21/15 1652      Chief Complaint  Patient presents with  . Hypertension   HPI  Megan Webb is a 20 y.o. G1P1001 who presents 9 days s/p SVD for BP check. Was induced for GHTN; BPs normalized prior to discharge. Nurse came to house today for BP check & it was 160/100. Reports mild headache x 4 days that she is taking ibuprofen for. Rates 2/10. Reports similar to previous headaches. Denies vision changes, epigastric pain, chest pain, SOB, or n/v. Some BLE edema since delivery.   OB History    Gravida Para Term Preterm AB TAB SAB Ectopic Multiple Living   1 1 1  0 0 0 0 0 0 1      Past Medical History  Diagnosis Date  . Headache     Past Surgical History  Procedure Laterality Date  . Bunionectomy      Family History  Problem Relation Age of Onset  . Mental illness Mother     anxiety and depression  . Hypertension Mother   . Kidney disease Mother   . Anemia Mother   . Cataracts Father   . Asthma Brother   . Diabetes Maternal Aunt   . Cancer Maternal Aunt     breast  . Diabetes Maternal Uncle   . Arthritis Maternal Grandmother   . Stroke Maternal Grandmother   . Hypertension Maternal Grandmother   . Arthritis Maternal Grandfather   . Hypertension Maternal Grandfather   . Stroke Maternal Grandfather   . Arthritis Paternal Grandfather   . Stroke Paternal Grandfather     Social History  Substance Use Topics  . Smoking status: Never Smoker   . Smokeless tobacco: None  . Alcohol Use: No    Allergies: No Known Allergies  Prescriptions prior to admission  Medication Sig Dispense Refill Last Dose  . ibuprofen (ADVIL,MOTRIN) 200 MG tablet Take 600 mg by mouth every 6 (six) hours as needed for moderate pain.   03/21/2015 at Unknown time  . Prenatal Vit-Fe Fumarate-FA (PRENATAL VITAMINS) 28-0.8 MG TABS Take 1 tablet by mouth daily. 30 tablet  11 03/21/2015 at Unknown time  . ibuprofen (ADVIL,MOTRIN) 600 MG tablet Take 1 tablet (600 mg total) by mouth every 6 (six) hours. (Patient not taking: Reported on 03/21/2015) 30 tablet 0 Not Taking at Unknown time    Review of Systems  Constitutional: Negative.   Eyes: Negative for blurred vision.  Respiratory: Negative.   Cardiovascular: Negative.   Gastrointestinal: Negative.   Neurological: Positive for headaches.   Physical Exam   Blood pressure 151/94, pulse 75, temperature 98.9 F (37.2 C), resp. rate 18, unknown if currently breastfeeding.  Patient Vitals for the past 24 hrs:  BP Temp Pulse Resp  03/21/15 1829 162/95 mmHg - 78 18  03/21/15 1801 139/95 mmHg - 68 -  03/21/15 1731 138/94 mmHg - 76 -  03/21/15 1716 (!) 148/103 mmHg - 79 -  03/21/15 1701 141/97 mmHg - 75 -  03/21/15 1616 151/94 mmHg - 75 -  03/21/15 1606 (!) 154/103 mmHg 98.9 F (37.2 C) 75 18     Physical Exam  Nursing note and vitals reviewed. Constitutional: She is oriented to person, place, and time. She appears well-developed and well-nourished. No distress.  HENT:  Head: Normocephalic and atraumatic.  Eyes: Conjunctivae are normal. Right eye exhibits no discharge. Left eye exhibits no discharge.  No scleral icterus.  Neck: Normal range of motion.  Cardiovascular: Normal rate, regular rhythm and normal heart sounds.   No murmur heard. Respiratory: Effort normal and breath sounds normal. No respiratory distress. She has no wheezes.  GI: Soft. There is no tenderness.  Musculoskeletal: Normal range of motion. She exhibits edema (BLE).  Neurological: She is alert and oriented to person, place, and time. She has normal reflexes.  No clonus  Skin: Skin is warm and dry. She is not diaphoretic.  Psychiatric: She has a normal mood and affect. Her behavior is normal. Judgment and thought content normal.    MAU Course  Procedures Results for orders placed or performed during the hospital encounter of  03/21/15 (from the past 24 hour(s))  Protein / creatinine ratio, urine     Status: Abnormal   Collection Time: 03/21/15  4:16 PM  Result Value Ref Range   Creatinine, Urine 55.00 mg/dL   Total Protein, Urine 26 mg/dL   Protein Creatinine Ratio 0.47 (H) 0.00 - 0.15 mg/mg[Cre]  CBC     Status: Abnormal   Collection Time: 03/21/15  4:37 PM  Result Value Ref Range   WBC 8.0 4.0 - 10.5 K/uL   RBC 3.78 (L) 3.87 - 5.11 MIL/uL   Hemoglobin 9.9 (L) 12.0 - 15.0 g/dL   HCT 30.8 (L) 36.0 - 46.0 %   MCV 81.5 78.0 - 100.0 fL   MCH 26.2 26.0 - 34.0 pg   MCHC 32.1 30.0 - 36.0 g/dL   RDW 17.5 (H) 11.5 - 15.5 %   Platelets 405 (H) 150 - 400 K/uL  Comprehensive metabolic panel     Status: Abnormal   Collection Time: 03/21/15  4:37 PM  Result Value Ref Range   Sodium 138 135 - 145 mmol/L   Potassium 4.0 3.5 - 5.1 mmol/L   Chloride 108 101 - 111 mmol/L   CO2 21 (L) 22 - 32 mmol/L   Glucose, Bld 85 65 - 99 mg/dL   BUN 17 6 - 20 mg/dL   Creatinine, Ser 0.58 0.44 - 1.00 mg/dL   Calcium 9.2 8.9 - 10.3 mg/dL   Total Protein 7.1 6.5 - 8.1 g/dL   Albumin 3.4 (L) 3.5 - 5.0 g/dL   AST 22 15 - 41 U/L   ALT 25 14 - 54 U/L   Alkaline Phosphatase 133 (H) 38 - 126 U/L   Total Bilirubin 0.3 0.3 - 1.2 mg/dL   GFR calc non Af Amer >60 >60 mL/min   GFR calc Af Amer >60 >60 mL/min   Anion gap 9 5 - 15    MDM No severe range BPs in MAU Discussed patient with Dr. Nehemiah Settle. Ok to d/c home on Norvasc. Return to clinic Wednesday for BP check.   Assessment and Plan  A: 1. Gestational hypertension with significant proteinuria, postpartum     P: Discharge home Rx norvasc Msg sent to clinic for BP check on Wednesday Discussed reasons to return to [place] 03/21/2015, 4:52 PM

## 2015-03-21 NOTE — MAU Note (Signed)
Pt at postpartum appointment and had elevated b/p. C/o a headache for several days. BLE edam 2+

## 2015-03-26 ENCOUNTER — Ambulatory Visit: Payer: 59

## 2015-03-26 VITALS — BP 126/79 | HR 97 | Temp 98.5°F

## 2015-03-26 DIAGNOSIS — Z0130 Encounter for examination of blood pressure without abnormal findings: Secondary | ICD-10-CM

## 2015-03-26 NOTE — Progress Notes (Signed)
Pt comes in to clinic today for a blood pressure check. She is currently doing well. Her blood pressure is 126/79. Pt will continued to monitor her blood pressure at home and she will returned to the clinic for her postpartum visit.

## 2015-04-21 ENCOUNTER — Encounter: Payer: Self-pay | Admitting: Advanced Practice Midwife

## 2015-04-21 ENCOUNTER — Ambulatory Visit (INDEPENDENT_AMBULATORY_CARE_PROVIDER_SITE_OTHER): Payer: 59 | Admitting: Advanced Practice Midwife

## 2015-04-21 VITALS — BP 120/81 | HR 71 | Temp 98.4°F | Wt 154.7 lb

## 2015-04-21 DIAGNOSIS — Z30430 Encounter for insertion of intrauterine contraceptive device: Secondary | ICD-10-CM | POA: Diagnosis not present

## 2015-04-21 DIAGNOSIS — Z3202 Encounter for pregnancy test, result negative: Secondary | ICD-10-CM

## 2015-04-21 LAB — POCT PREGNANCY, URINE: Preg Test, Ur: NEGATIVE

## 2015-04-21 MED ORDER — LEVONORGESTREL 20 MCG/24HR IU IUD
INTRAUTERINE_SYSTEM | Freq: Once | INTRAUTERINE | Status: AC
  Administered 2015-04-21: 17:00:00 via INTRAUTERINE

## 2015-04-21 NOTE — Patient Instructions (Signed)
Levonorgestrel intrauterine device (IUD) What is this medicine? LEVONORGESTREL IUD (LEE voe nor jes trel) is a contraceptive (birth control) device. The device is placed inside the uterus by a healthcare professional. It is used to prevent pregnancy and can also be used to treat heavy bleeding that occurs during your period. Depending on the device, it can be used for 3 to 5 years. This medicine may be used for other purposes; ask your health care provider or pharmacist if you have questions. What should I tell my health care provider before I take this medicine? They need to know if you have any of these conditions: -abnormal Pap smear -cancer of the breast, uterus, or cervix -diabetes -endometritis -genital or pelvic infection now or in the past -have more than one sexual partner or your partner has more than one partner -heart disease -history of an ectopic or tubal pregnancy -immune system problems -IUD in place -liver disease or tumor -problems with blood clots or take blood-thinners -use intravenous drugs -uterus of unusual shape -vaginal bleeding that has not been explained -an unusual or allergic reaction to levonorgestrel, other hormones, silicone, or polyethylene, medicines, foods, dyes, or preservatives -pregnant or trying to get pregnant -breast-feeding How should I use this medicine? This device is placed inside the uterus by a health care professional. Talk to your pediatrician regarding the use of this medicine in children. Special care may be needed. Overdosage: If you think you have taken too much of this medicine contact a poison control center or emergency room at once. NOTE: This medicine is only for you. Do not share this medicine with others. What if I miss a dose? This does not apply. What may interact with this medicine? Do not take this medicine with any of the following medications: -amprenavir -bosentan -fosamprenavir This medicine may also interact with  the following medications: -aprepitant -barbiturate medicines for inducing sleep or treating seizures -bexarotene -griseofulvin -medicines to treat seizures like carbamazepine, ethotoin, felbamate, oxcarbazepine, phenytoin, topiramate -modafinil -pioglitazone -rifabutin -rifampin -rifapentine -some medicines to treat HIV infection like atazanavir, indinavir, lopinavir, nelfinavir, tipranavir, ritonavir -St. John's wort -warfarin This list may not describe all possible interactions. Give your health care provider a list of all the medicines, herbs, non-prescription drugs, or dietary supplements you use. Also tell them if you smoke, drink alcohol, or use illegal drugs. Some items may interact with your medicine. What should I watch for while using this medicine? Visit your doctor or health care professional for regular check ups. See your doctor if you or your partner has sexual contact with others, becomes HIV positive, or gets a sexual transmitted disease. This product does not protect you against HIV infection (AIDS) or other sexually transmitted diseases. You can check the placement of the IUD yourself by reaching up to the top of your vagina with clean fingers to feel the threads. Do not pull on the threads. It is a good habit to check placement after each menstrual period. Call your doctor right away if you feel more of the IUD than just the threads or if you cannot feel the threads at all. The IUD may come out by itself. You may become pregnant if the device comes out. If you notice that the IUD has come out use a backup birth control method like condoms and call your health care provider. Using tampons will not change the position of the IUD and are okay to use during your period. What side effects may I notice from receiving this medicine?   Side effects that you should report to your doctor or health care professional as soon as possible: -allergic reactions like skin rash, itching or  hives, swelling of the face, lips, or tongue -fever, flu-like symptoms -genital sores -high blood pressure -no menstrual period for 6 weeks during use -pain, swelling, warmth in the leg -pelvic pain or tenderness -severe or sudden headache -signs of pregnancy -stomach cramping -sudden shortness of breath -trouble with balance, talking, or walking -unusual vaginal bleeding, discharge -yellowing of the eyes or skin Side effects that usually do not require medical attention (report to your doctor or health care professional if they continue or are bothersome): -acne -breast pain -change in sex drive or performance -changes in weight -cramping, dizziness, or faintness while the device is being inserted -headache -irregular menstrual bleeding within first 3 to 6 months of use -nausea This list may not describe all possible side effects. Call your doctor for medical advice about side effects. You may report side effects to FDA at 1-800-FDA-1088. Where should I keep my medicine? This does not apply. NOTE: This sheet is a summary. It may not cover all possible information. If you have questions about this medicine, talk to your doctor, pharmacist, or health care provider.    2016, Elsevier/Gold Standard. (2011-02-11 13:54:04)  

## 2015-04-21 NOTE — Progress Notes (Signed)
Patient ID: Megan Webb, female   DOB: March 23, 1995, 20 y.o.   MRN: [id number] Subjective:     Laterra Pipkin is a 20 y.o. female who presents for a postpartum visit. She is 5 weeks postpartum following a spontaneous vaginal delivery. I have fully reviewed the prenatal and intrapartum course. The delivery was at 73 gestational weeks. Outcome: spontaneous vaginal delivery. Anesthesia: epidural. Postpartum course has been complicated by elevated blood pressure. Baby's course has been uncomplicated. Baby is feeding by both breast and bottle - Enfamil Lipil. Bleeding no bleeding. Bowel function is normal. Bladder function is normal. Patient is not sexually active. Contraception method is IUD. Postpartum depression screening: negative.  The following portions of the patient's history were reviewed and updated as appropriate: allergies, current medications, past family history, past medical history, past social history, past surgical history and problem list.  Review of Systems Pertinent items are noted in HPI.   Objective:    BP 120/81 mmHg  Pulse 71  Temp(Src) 98.4 F (36.9 C) (Oral)  Wt 154 lb 11.2 oz (70.171 kg)  Breastfeeding? Yes  General:  alert, cooperative and no distress   Breasts:  inspection negative, no nipple discharge or bleeding, no masses or nodularity palpable  Lungs: clear to auscultation bilaterally  Heart:  regular rate and rhythm, S1, S2 normal, no murmur, click, rub or gallop  Abdomen: soft, non-tender; bowel sounds normal; no masses,  no organomegaly   Vulva:  normal  Vagina: normal vagina, no discharge, exudate, lesion, or erythema  Cervix:  multiparous appearance and no lesions  Corpus: normal size, contour, position, consistency, mobility, non-tender  Adnexa:  no mass, fullness, tenderness  Rectal Exam: Not performed.        Patient identified, informed consent performed, signed copy in chart, time out was performed.  Urine pregnancy test  negative.  Speculum placed in the vagina.  Cervix visualized.  Cleaned with Betadine x 2.  Grasped anteriourly with a single tooth tenaculum.  Uterus sounded to 5cm.  Mirena IUD placed per manufacturer's recommendations.  Strings trimmed to 3 cm.   Patient given post procedure instructions and Mirena care card with expiration date.  Patient is asked to check IUD strings periodically and follow up in 4-6 weeks for IUD check.  Assessment:     Normal postpartum exam. Pap smear not done at today's visit.   Plan:    1. Contraception: IUD 2. Advised to come in one month for string check and BP check.  Advised to stop Norvasc 3. Follow up in: 1 month or as needed.

## 2015-04-22 ENCOUNTER — Encounter: Payer: Self-pay | Admitting: General Practice

## 2015-04-22 ENCOUNTER — Telehealth: Payer: Self-pay | Admitting: *Deleted

## 2015-04-22 NOTE — Telephone Encounter (Addendum)
Pt called and stated that she had Mirena placed yesterday in clinic. Last night she developed fever of 101.5 and also had chills, shivering and sweating.  She wants to know if thsi is normal.  I called pt back and left message requesting her to call back with the following information: How is she feeling today? Did she take anything for the fever?  1356   Pt called back and left message that the fever started last night @ midnight. She also had burning sensation on her arms and itching of her arms as well as headache. I called pt and discussed her concern and condition. She does not have fever at the present time. I advised that her symptoms are not likely to be related to the Mirena insertion. She should seek evaluation at an Urgent Care facility. Pt voiced understanding.

## 2015-05-22 ENCOUNTER — Encounter: Payer: Self-pay | Admitting: Family Medicine

## 2015-05-22 ENCOUNTER — Ambulatory Visit (INDEPENDENT_AMBULATORY_CARE_PROVIDER_SITE_OTHER): Payer: 59 | Admitting: Family Medicine

## 2015-05-22 VITALS — BP 131/87 | HR 91 | Ht 63.0 in | Wt 155.4 lb

## 2015-05-22 DIAGNOSIS — Z30431 Encounter for routine checking of intrauterine contraceptive device: Secondary | ICD-10-CM | POA: Diagnosis not present

## 2015-05-22 NOTE — Progress Notes (Signed)
   Subjective:    Patient ID: Megan Webb, female    DOB: Aug 27, 1995, 20 y.o.   MRN: [id number]  HPI Having some spotting, but no cramping or pain.   Review of Systems     Objective:   Physical Exam  Constitutional: She appears well-developed and well-nourished.  Genitourinary: There is no rash, tenderness, lesion or injury on the right labia. There is no rash, tenderness, lesion or injury on the left labia. Right adnexum displays no mass, no tenderness and no fullness. Left adnexum displays no mass, no tenderness and no fullness. No erythema, tenderness or bleeding in the vagina. No foreign body around the vagina. No signs of injury around the vagina. No vaginal discharge found.        Assessment & Plan:  1. IUD check up If still having spotting in 2-3 months, pt to call.  Can try provera or OCPs at that time.

## 2015-05-29 ENCOUNTER — Ambulatory Visit (INDEPENDENT_AMBULATORY_CARE_PROVIDER_SITE_OTHER): Payer: 59 | Admitting: Family Medicine

## 2015-05-29 ENCOUNTER — Encounter: Payer: Self-pay | Admitting: Family Medicine

## 2015-05-29 VITALS — BP 125/80 | HR 72 | Ht 63.0 in | Wt 153.0 lb

## 2015-05-29 DIAGNOSIS — R300 Dysuria: Secondary | ICD-10-CM | POA: Diagnosis not present

## 2015-05-29 DIAGNOSIS — Z30011 Encounter for initial prescription of contraceptive pills: Secondary | ICD-10-CM

## 2015-05-29 DIAGNOSIS — Z30432 Encounter for removal of intrauterine contraceptive device: Secondary | ICD-10-CM | POA: Diagnosis not present

## 2015-05-29 MED ORDER — NORETHINDRONE 0.35 MG PO TABS: 1.0000 | ORAL_TABLET | Freq: Every day | ORAL | Status: DC

## 2015-05-29 NOTE — Addendum Note (Signed)
Addended by: Phill Myron on: 05/29/2015 10:50 AM   Modules accepted: Orders

## 2015-05-29 NOTE — Progress Notes (Signed)
Patient seen for IUD removal - having cramping, partner can feel strings, states that she is anxious with the IUD in.  Would like to change to OCPs.  Is breastfeeding - wants to switch to POP.  Discussed that she needs to take in at same time every day.  Micronor sent to pharmacy.  Discussed that if she would like to switch to combined OCPs when she has finished breastfeeding, to call.    IUD Removal  Patient was in the dorsal lithotomy position, normal external genitalia was noted.  A speculum was placed in the patient's vagina, normal discharge was noted, no lesions. The multiparous cervix was visualized, no lesions, no abnormal discharge.  The strings of the IUD was grasped and pulled using ring forceps.  The IUD was successfully removed in its entirety.  Patient tolerated the procedure well.

## 2015-06-01 LAB — CULTURE, URINE COMPREHENSIVE: Colony Count: 85000

## 2016-03-31 ENCOUNTER — Ambulatory Visit (INDEPENDENT_AMBULATORY_CARE_PROVIDER_SITE_OTHER): Payer: 59 | Admitting: Obstetrics & Gynecology

## 2016-03-31 ENCOUNTER — Other Ambulatory Visit (HOSPITAL_COMMUNITY)
Admission: RE | Admit: 2016-03-31 | Discharge: 2016-03-31 | Disposition: A | Discharge status: Home / Self Care | Payer: 59 | Source: Ambulatory Visit | Attending: Obstetrics & Gynecology | Admitting: Obstetrics & Gynecology

## 2016-03-31 ENCOUNTER — Encounter: Payer: Self-pay | Admitting: Obstetrics & Gynecology

## 2016-03-31 VITALS — BP 128/88 | HR 82 | Ht 63.0 in | Wt 158.0 lb

## 2016-03-31 DIAGNOSIS — Z0000 Encounter for general adult medical examination without abnormal findings: Secondary | ICD-10-CM

## 2016-03-31 DIAGNOSIS — Z01419 Encounter for gynecological examination (general) (routine) without abnormal findings: Secondary | ICD-10-CM

## 2016-03-31 MED ORDER — NORGESTREL-ETHINYL ESTRADIOL 0.3-30 MG-MCG PO TABS: 1.0000 | ORAL_TABLET | Freq: Every day | ORAL | 11 refills | Status: DC

## 2016-03-31 NOTE — Progress Notes (Signed)
Subjective:    Megan Webb is a 21 y.o. M H P28 (75 yo daughter) female who presents for an annual exam. The patient has no complaints today. She is not using contraception now but doesn't want a pregnancy right now. Did not like the Mirena as husband could feel the strings. She is interested in taking OCPs. The patient is sexually active. GYN screening history: no prior history of gyn screening tests. The patient wears seatbelts: yes. The patient participates in regular exercise: no. Has the patient ever been transfused or tattooed?: yes. The patient reports that there is not domestic violence in her life.   Menstrual History: OB History    Gravida Para Term Preterm AB Living   1 1 1  0 0 1   SAB TAB Ectopic Multiple Live Births   0 0 0 0 1      Menarche age: 46 Patient's last menstrual period was 03/25/2016.    The following portions of the patient's history were reviewed and updated as appropriate: allergies, current medications, past family history, past medical history, past social history, past surgical history and problem list.  Review of Systems Pertinent items are noted in HPI.   Works at [employer], Producer, television/film/video Married for 6 months Denies dyspareunia FH- + breast cancer- maunt, no gyn or colon cancer   Objective:    BP 128/88   Pulse 82   Ht 5\' 3"  (1.6 m)   Wt 158 lb (71.7 kg)   LMP 03/25/2016   Breastfeeding? Yes   BMI 27.99 kg/m   General Appearance:    Alert, cooperative, no distress, appears stated age  Head:    Normocephalic, without obvious abnormality, atraumatic  Eyes:    PERRL, conjunctiva/corneas clear, EOM's intact, fundi    benign, both eyes  Ears:    Normal TM's and external ear canals, both ears  Nose:   Nares normal, septum midline, mucosa normal, no drainage    or sinus tenderness  Throat:   Lips, mucosa, and tongue normal; teeth and gums normal  Neck:   Supple, symmetrical, trachea midline, no adenopathy;     thyroid:  no enlargement/tenderness/nodules; no carotid   bruit or JVD  Back:     Symmetric, no curvature, ROM normal, no CVA tenderness  Lungs:     Clear to auscultation bilaterally, respirations unlabored  Chest Wall:    No tenderness or deformity   Heart:    Regular rate and rhythm, S1 and S2 normal, no murmur, rub   or gallop  Breast Exam:    No tenderness, masses, or nipple abnormality  Abdomen:     Soft, non-tender, bowel sounds active all four quadrants,    no masses, no organomegaly  Genitalia:    Normal female without lesion, discharge or tenderness, NSSR, minimal mobility but no pain or adnexal masses     Extremities:   Extremities normal, atraumatic, no cyanosis or edema  Pulses:   2+ and symmetric all extremities  Skin:   Skin color, texture, turgor normal, no rashes or lesions  Lymph nodes:   Cervical, supraclavicular, and axillary nodes normal  Neurologic:   CNII-XII intact, normal strength, sensation and reflexes    throughout  .    Assessment:    Healthy female exam.   Contraception   Plan:     Thin prep Pap smear.   Start Lo ovral today (on period now). Rec back up method for 4 weeks.

## 2016-04-02 LAB — CYTOLOGY - PAP: Diagnosis: NEGATIVE

## 2016-07-13 ENCOUNTER — Telehealth: Payer: Self-pay

## 2016-07-13 MED ORDER — FLUCONAZOLE 150 MG PO TABS: 150.0000 mg | ORAL_TABLET | Freq: Once | ORAL | 1 refills | Status: AC

## 2016-07-13 NOTE — Telephone Encounter (Signed)
Patient called stating that she has had a yeast infection since Thursday 07-08-16. Patient states she has white clumpy discharge and vaginal itching.  Patient states she did one day monistat yesterday and it made the itching worse and she has a few red bumps on the outside now. Patient given diflucan per protocol and instructed to call back tomorrow if bumps or yeast symptoms do not get better so she can come in for a doctor visit. Patient states understanding. Kathrene Alu RN BSN

## 2016-09-14 ENCOUNTER — Telehealth: Payer: Self-pay

## 2016-09-15 ENCOUNTER — Encounter: Payer: Self-pay | Admitting: Family Medicine

## 2016-09-15 ENCOUNTER — Ambulatory Visit (INDEPENDENT_AMBULATORY_CARE_PROVIDER_SITE_OTHER): Payer: 59 | Admitting: Family Medicine

## 2016-09-15 VITALS — BP 108/70 | HR 65 | Temp 98.6°F | Ht 63.0 in | Wt 149.4 lb

## 2016-09-15 DIAGNOSIS — E041 Nontoxic single thyroid nodule: Secondary | ICD-10-CM | POA: Diagnosis not present

## 2016-09-15 LAB — HIV ANTIBODY (ROUTINE TESTING W REFLEX): HIV 1&2 Ab, 4th Generation: NONREACTIVE

## 2016-09-15 LAB — CBC
HCT: 40.4 % (ref 36.0–46.0)
Hemoglobin: 13.6 g/dL (ref 12.0–15.0)
MCHC: 33.6 g/dL (ref 30.0–36.0)
MCV: 89.3 fl (ref 78.0–100.0)
Platelets: 248 10*3/uL (ref 150.0–400.0)
RBC: 4.53 Mil/uL (ref 3.87–5.11)
RDW: 14.1 % (ref 11.5–15.5)
WBC: 4.3 10*3/uL (ref 4.0–10.5)

## 2016-09-15 LAB — TSH: TSH: 1.04 u[IU]/mL (ref 0.35–4.50)

## 2016-09-15 LAB — T4, FREE: Free T4: 0.99 ng/dL (ref 0.60–1.60)

## 2016-09-15 NOTE — Progress Notes (Signed)
Chief Complaint  Patient presents with  . Establish Care       New Patient Visit SUBJECTIVE: HPI: Megan Webb is an 21 y.o.female who is being seen for establishing care.  The patient was previously seen at .  Lump on neck initially started 6 mo ago and went away after around 1 week. It came back around 1 mo ago and is painful, particularly when she presses on it. She thinks it may be growing, however is not certain. No history of allergies or recent sickness. There have been no changes in medication. She denies any injury. There are no skin changes over the area. She denies any fevers, weight loss, difficulty swallowing, shortness of breath, cough, or personal of cancer. No first-generation relatives with history of cancer.  No Known Allergies  Past Medical History:  Diagnosis Date  . Headache    Past Surgical History:  Procedure Laterality Date  . BUNIONECTOMY     Social History   Social History  . Marital status: Married   Social History Main Topics  . Smoking status: Never Smoker  . Smokeless tobacco: Never Used  . Alcohol use Yes     Comment: occ  . Drug use: Yes    Frequency: 1.0 time per week    Types: Marijuana  . Sexual activity: Yes    Birth control/ protection: None   Family History  Problem Relation Age of Onset  . Mental illness Mother        anxiety and depression  . Hypertension Mother   . Kidney disease Mother   . Anemia Mother   . Cataracts Father   . Asthma Brother   . Diabetes Maternal Aunt   . Cancer Maternal Aunt        breast  . Diabetes Maternal Uncle   . Arthritis Maternal Grandmother   . Stroke Maternal Grandmother   . Hypertension Maternal Grandmother   . Arthritis Maternal Grandfather   . Hypertension Maternal Grandfather   . Stroke Maternal Grandfather   . Arthritis Paternal Grandfather   . Stroke Paternal Grandfather      Current Outpatient Prescriptions:  .  Prenatal Vit-Fe Fumarate-FA (PRENATAL VITAMINS) 28-0.8  MG TABS, Take 1 tablet by mouth daily. (Patient not taking: Reported on 05/22/2015), Disp: 30 tablet, Rfl: 11  ROS Const: Denies wt loss  Respiratory: Denies dyspnea   OBJECTIVE: BP 108/70 (BP Location: Left Arm, Patient Position: Sitting, Cuff Size: Normal)   Pulse 65   Temp 98.6 F (37 C) (Oral)   Ht 5\' 3"  (1.6 m)   Wt 149 lb 6 oz (67.8 kg)   SpO2 96%   BMI 26.46 kg/m   Constitutional: -  VS reviewed -  Well developed, well nourished, appears stated age -  No apparent distress  Psychiatric: -  Oriented to person, place, and time -  Memory intact -  Affect and mood normal -  Fluent conversation, good eye contact -  Judgment and insight age appropriate  Eye: -  Conjunctivae clear, no discharge -  Pupils symmetric, round, reactive to light  ENMT: -  MMM    Pharynx moist, no exudate, no erythema  Neck: -  No gross swelling, no palpable masses -  Thyroid midline, not enlarged, mobile, nodule appreciated on L area  Cardiovascular: -  RRR -  No LE edema  Respiratory: -  Normal respiratory effort, no accessory muscle use, no retraction -  Breath sounds equal, no wheezes, no ronchi, no crackles  Musculoskeletal: -  No clubbing, no cyanosis -  SCM on L more tense than R, no TTP -  Gait normal  Skin: -  No significant lesion on inspection -  Warm and dry to palpation   ASSESSMENT/PLAN: Thyroid nodule - Plan: T4, free, CBC, HIV antibody, TSH, US THYROID  Patient instructed to sign release of records form from her previous PCP. Orders as above. Pt requesting screening for HIV. Patient should return at her earliest convenience for CPE- she follows with GYN for woman's health. The patient voiced understanding and agreement to the plan.   [place], DO 09/15/16  12:20 PM

## 2016-09-15 NOTE — Patient Instructions (Addendum)
Give [place] 2-3 business days to get the results of your labs back. If labs are normal, you will likely receive a letter in the mail unless you have MyChart. This can take longer than 2-3 business days.   If you do not hear anything about your ultrasound in the next week, call our office and ask for an update.  Let us know if you need anything.

## 2016-09-15 NOTE — Progress Notes (Signed)
Pre visit review using our clinic review tool, if applicable. No additional management support is needed unless otherwise documented below in the visit note. 

## 2016-09-16 ENCOUNTER — Ambulatory Visit (INDEPENDENT_AMBULATORY_CARE_PROVIDER_SITE_OTHER): Payer: 59 | Admitting: Family Medicine

## 2016-09-16 ENCOUNTER — Other Ambulatory Visit (HOSPITAL_COMMUNITY)
Admission: RE | Admit: 2016-09-16 | Discharge: 2016-09-16 | Disposition: A | Discharge status: Home / Self Care | Payer: 59 | Source: Ambulatory Visit | Attending: Family Medicine | Admitting: Family Medicine

## 2016-09-16 ENCOUNTER — Encounter: Payer: Self-pay | Admitting: Family Medicine

## 2016-09-16 VITALS — BP 114/87 | HR 80 | Ht 63.0 in | Wt 151.0 lb

## 2016-09-16 DIAGNOSIS — Z3202 Encounter for pregnancy test, result negative: Secondary | ICD-10-CM | POA: Diagnosis not present

## 2016-09-16 DIAGNOSIS — N939 Abnormal uterine and vaginal bleeding, unspecified: Secondary | ICD-10-CM

## 2016-09-16 LAB — POCT URINE PREGNANCY: Preg Test, Ur: NEGATIVE

## 2016-09-16 MED ORDER — NORETHIN-ETH ESTRAD-FE BIPHAS 1 MG-10 MCG / 10 MCG PO TABS: 1.0000 | ORAL_TABLET | Freq: Every day | ORAL | 11 refills | Status: DC

## 2016-09-16 NOTE — Progress Notes (Signed)
   Subjective:    Patient ID: Megan Webb, female    DOB: 09/14/1995, 21 y.o.   MRN: [id number]  HPI Patient spotting for two week after last period. Was placed on lo ovral in march, but stopped it after a couple of cycles due to vaginal dryness. Mild LLQ/left pelvic discomfort. No abnormal discharge.   Review of Systems     Objective:   Physical Exam  Constitutional: She is oriented to person, place, and time. She appears well-developed and well-nourished.  Cardiovascular: Normal rate, regular rhythm and normal heart sounds.   Pulmonary/Chest: Effort normal and breath sounds normal. No respiratory distress. She has no wheezes. She has no rales.  Abdominal: Soft. She exhibits no distension. There is tenderness (left lower quadrant tenderness). There is no rebound and no guarding.  Neurological: She is alert and oriented to person, place, and time.  Skin: Skin is warm and dry.  Psychiatric: She has a normal mood and affect. Her behavior is normal. Judgment and thought content normal.      Assessment & Plan:  1. Abnormal uterine bleeding Lo loestrin prescribed. Will see if this improves her bleeding. Patient to contact us if having problems despite ocps. - POCT urine pregnancy - GC/Chlamydia probe amp ([place])not at [facility]

## 2016-09-16 NOTE — Progress Notes (Signed)
LMP: 08-22-16 to 08-27-16  And then Patient reports spotting for two weeks. Kathrene Alu RNBSN

## 2016-09-18 ENCOUNTER — Ambulatory Visit (HOSPITAL_BASED_OUTPATIENT_CLINIC_OR_DEPARTMENT_OTHER)
Admission: RE | Admit: 2016-09-18 | Discharge: 2016-09-18 | Disposition: A | Discharge status: Home / Self Care | Payer: 59 | Source: Ambulatory Visit | Attending: Family Medicine | Admitting: Family Medicine

## 2016-09-18 DIAGNOSIS — E041 Nontoxic single thyroid nodule: Secondary | ICD-10-CM

## 2016-09-20 ENCOUNTER — Other Ambulatory Visit: Payer: Self-pay | Admitting: Family Medicine

## 2016-09-20 DIAGNOSIS — E041 Nontoxic single thyroid nodule: Secondary | ICD-10-CM

## 2016-09-20 LAB — GC/CHLAMYDIA PROBE AMP (~~LOC~~) NOT AT ARMC
Chlamydia: NEGATIVE
Neisseria Gonorrhea: NEGATIVE

## 2016-09-20 NOTE — Progress Notes (Signed)
ENT referral placed, pt notified over phone.

## 2016-10-18 ENCOUNTER — Encounter (HOSPITAL_COMMUNITY): Admission: RE | Payer: Self-pay | Source: Ambulatory Visit

## 2016-10-18 ENCOUNTER — Ambulatory Visit (HOSPITAL_COMMUNITY): Admission: RE | Admit: 2016-10-18 | Payer: 59 | Source: Ambulatory Visit | Admitting: Otolaryngology

## 2016-10-18 SURGERY — THYROIDECTOMY
Anesthesia: General | Laterality: Left

## 2016-11-23 NOTE — Telephone Encounter (Signed)
Completed.

## 2017-02-16 ENCOUNTER — Ambulatory Visit: Payer: BLUE CROSS/BLUE SHIELD | Admitting: Obstetrics & Gynecology

## 2017-02-16 ENCOUNTER — Encounter: Payer: Self-pay | Admitting: Obstetrics & Gynecology

## 2017-02-16 VITALS — BP 119/80 | HR 65 | Wt 146.0 lb

## 2017-02-16 DIAGNOSIS — N939 Abnormal uterine and vaginal bleeding, unspecified: Secondary | ICD-10-CM

## 2017-02-16 DIAGNOSIS — A499 Bacterial infection, unspecified: Secondary | ICD-10-CM | POA: Diagnosis not present

## 2017-02-16 DIAGNOSIS — N9089 Other specified noninflammatory disorders of vulva and perineum: Secondary | ICD-10-CM | POA: Diagnosis not present

## 2017-02-16 MED ORDER — NORETHIN ACE-ETH ESTRAD-FE 1-20 MG-MCG(24) PO TABS: 1.0000 | ORAL_TABLET | Freq: Every day | ORAL | 11 refills | Status: DC

## 2017-02-16 NOTE — Progress Notes (Signed)
History:  22 y.o. G1P1001 here today for f/u of AUB and concern of whether she has genital warts. She reports several months of AUB with menses. Her LMP was about 2 weeks ago but, she is uncertain as she has bleeding bimonthly. She reports that she was sexually active 1 week prev. But, started bleeding soon after that.     The following portions of the patient's history were reviewed and updated as appropriate: allergies, current medications, past family history, past medical history, past social history, past surgical history and problem list.  Review of Systems:  Pertinent items are noted in HPI.   Objective:  Physical Exam BP 119/80   Pulse 65   Wt 146 lb (66.2 kg)   LMP 02/09/2017   BMI 25.86 kg/m   CONSTITUTIONAL: Well-developed, well-nourished female in no acute distress.  HENT:  Normocephalic, atraumatic EYES: Conjunctivae and EOM are normal. No scleral icterus.  NECK: Normal range of motion SKIN: Skin is warm and dry. No rash noted. Not diaphoretic.No pallor. [place]: Alert and oriented to person, place, and time. Normal coordination.  Abd: Soft, nontender and nondistended Pelvic: external genitalia: initially there was what appeared to be an genital as I wiped this area the lesion came off (this was sent to path) There are no more lesion noted but, what appeared to be normal rugae. Acetic acid was applied and the tissue appeared to have a vague white appearance.  Normal discharge with blood in vault.  Small uterus, no other palpable masses, no uterine or adnexal tenderness   Assessment & Plan:  AUB-  Pelvic US  LoEstrin 1/20 1 po q day  F/u in 3 months   Genital warts-  send tissue for pathologic eval  Total face-to-face time with patient was 15 min.  Greater than 50% was spent in counseling and coordination of care with the patient.    Anjulie Dipierro L. Harraway-Smith, M.D., Cherlynn June

## 2017-02-18 ENCOUNTER — Ambulatory Visit (HOSPITAL_BASED_OUTPATIENT_CLINIC_OR_DEPARTMENT_OTHER): Payer: BLUE CROSS/BLUE SHIELD

## 2017-02-18 ENCOUNTER — Telehealth: Payer: Self-pay

## 2017-02-18 MED ORDER — FLUCONAZOLE 100 MG PO TABS: 100.0000 mg | ORAL_TABLET | Freq: Every day | ORAL | 1 refills | Status: DC

## 2017-02-18 NOTE — Telephone Encounter (Signed)
Patient was just seen this week and complaining of vaginal itching and burning. Patient states she has had yeast infection before and was beginning to have symptoms when she was here on Wednesday but doesn't think she was check for yeast infection.  Patient having white cottage cheese like discharge with itching for last three days. Will send in diflucan rx per protocol. Kathrene Alu RNBSN

## 2017-02-22 ENCOUNTER — Encounter: Payer: Self-pay | Admitting: *Deleted

## 2017-03-02 ENCOUNTER — Ambulatory Visit (HOSPITAL_BASED_OUTPATIENT_CLINIC_OR_DEPARTMENT_OTHER): Payer: BLUE CROSS/BLUE SHIELD

## 2017-03-03 ENCOUNTER — Ambulatory Visit (HOSPITAL_BASED_OUTPATIENT_CLINIC_OR_DEPARTMENT_OTHER)
Admission: RE | Admit: 2017-03-03 | Discharge: 2017-03-03 | Disposition: A | Discharge status: Home / Self Care | Payer: BLUE CROSS/BLUE SHIELD | Source: Ambulatory Visit | Attending: Obstetrics & Gynecology | Admitting: Obstetrics & Gynecology

## 2017-03-03 ENCOUNTER — Ambulatory Visit (HOSPITAL_BASED_OUTPATIENT_CLINIC_OR_DEPARTMENT_OTHER): Payer: BLUE CROSS/BLUE SHIELD

## 2017-03-03 DIAGNOSIS — N939 Abnormal uterine and vaginal bleeding, unspecified: Secondary | ICD-10-CM

## 2017-03-09 ENCOUNTER — Telehealth: Payer: Self-pay

## 2017-03-09 NOTE — Telephone Encounter (Signed)
Patient called stating that she was looking for results from her ultrasound.  Patient made aware that I can read her the impression from radiologist but I have received Plan of care from Dr. Ihor Dow.  Read "No acute abnormalities. No cause for dysfunctional uterine bleeding identified. If bleeding remains unresponsive to hormonal or medical therapy, sonohysterogram should be considered for focal lesion work-up. "  Asked patient if she has been taking the Lo Estrin Dr. Ihor Dow prescribed since she is still having bleeding. Patient states she was waiting to hear about the ultrasound before starting the medication. Patient made aware we will schedule her 3 month follow up once April schedule is out. Kathrene Alu RNBSN

## 2017-04-11 ENCOUNTER — Other Ambulatory Visit: Payer: Self-pay

## 2017-04-11 MED ORDER — FLUCONAZOLE 150 MG PO TABS: 150.0000 mg | ORAL_TABLET | Freq: Once | ORAL | 1 refills | Status: AC

## 2017-04-11 NOTE — Telephone Encounter (Signed)
Patient state she has yeast infection and is allergic to over the counter Monistat. Patient states that she just got off period. She is having white discharge with itching. Will send in diflucan per protocol. Kathrene Alu RNBSN

## 2017-05-06 ENCOUNTER — Ambulatory Visit: Payer: BLUE CROSS/BLUE SHIELD | Admitting: Obstetrics & Gynecology

## 2017-05-06 DIAGNOSIS — Z09 Encounter for follow-up examination after completed treatment for conditions other than malignant neoplasm: Secondary | ICD-10-CM

## 2017-05-31 ENCOUNTER — Encounter: Payer: Self-pay | Admitting: Nurse Practitioner

## 2017-05-31 ENCOUNTER — Other Ambulatory Visit (HOSPITAL_COMMUNITY)
Admission: RE | Admit: 2017-05-31 | Discharge: 2017-05-31 | Disposition: A | Discharge status: Home / Self Care | Payer: BLUE CROSS/BLUE SHIELD | Source: Ambulatory Visit | Attending: Nurse Practitioner | Admitting: Nurse Practitioner

## 2017-05-31 ENCOUNTER — Ambulatory Visit: Payer: BLUE CROSS/BLUE SHIELD | Admitting: Nurse Practitioner

## 2017-05-31 VITALS — BP 112/74 | HR 87 | Temp 98.5°F | Ht 63.0 in | Wt 140.0 lb

## 2017-05-31 DIAGNOSIS — N760 Acute vaginitis: Secondary | ICD-10-CM | POA: Insufficient documentation

## 2017-05-31 DIAGNOSIS — Z7251 High risk heterosexual behavior: Secondary | ICD-10-CM | POA: Insufficient documentation

## 2017-05-31 DIAGNOSIS — B373 Candidiasis of vulva and vagina: Secondary | ICD-10-CM | POA: Diagnosis not present

## 2017-05-31 LAB — POCT URINE PREGNANCY: Preg Test, Ur: NEGATIVE

## 2017-05-31 MED ORDER — METRONIDAZOLE 500 MG PO TABS: 2000.0000 mg | ORAL_TABLET | Freq: Once | ORAL | 0 refills | Status: AC

## 2017-05-31 NOTE — Progress Notes (Addendum)
Subjective:  Patient ID: [name], female    DOB: 17-May-1995  Age: 22 y.o. MRN: [id number]  CC: Vaginitis (reoccuring itchy,discharge --yellow/white color--2 wks ago. 01/2017,02/2017/ saw GYN--got diflucan twice)   Vaginal Discharge  The patient's primary symptoms include genital itching, a genital odor and vaginal discharge. The patient's pertinent negatives include no genital lesions, genital rash, missed menses, pelvic pain or vaginal bleeding. The current episode started more than 1 month ago. The problem has been waxing and waning. The pain is mild. The problem affects both sides. She is not pregnant. Pertinent negatives include no abdominal pain, anorexia, back pain, chills, dysuria, fever, flank pain, frequency, joint pain, painful intercourse or urgency. The vaginal discharge was thick and mucopurulent. The vaginal bleeding is typical of menses. She has not been passing clots. She has not been passing tissue. The symptoms are aggravated by intercourse. She has tried antifungals for the symptoms. The treatment provided mild relief. She is sexually active. It is unknown whether or not her partner has an STD. She uses nothing for contraception. Her menstrual history has been regular. Her past medical history is significant for vaginosis.   Treated with diflucan twice. Last used 2weeks ago LMP 1week ago.  Outpatient Medications Prior to Visit  Medication Sig Dispense Refill  . Multiple Vitamin (MULTIVITAMIN) tablet Take 1 tablet by mouth daily.    . Norethindrone Acetate-Ethinyl Estrad-FE (LOESTRIN 24 FE) 1-20 MG-MCG(24) tablet Take 1 tablet by mouth daily. (Patient not taking: Reported on 05/31/2017) 1 Package 11  . Norethindrone-Ethinyl Estradiol-Fe Biphas (LO LOESTRIN FE) 1 MG-10 MCG / 10 MCG tablet Take 1 tablet by mouth daily. (Patient not taking: Reported on 02/16/2017) 1 Package 11  . fluconazole (DIFLUCAN) 100 MG tablet Take 1 tablet (100 mg total) by mouth daily.  (Patient not taking: Reported on 05/31/2017) 1 tablet 1   No facility-administered medications prior to visit.     ROS See HPI  Objective:  BP 112/74   Pulse 87   Temp 98.5 F (36.9 C) (Oral)   Ht 5\' 3"  (1.6 m)   Wt 140 lb (63.5 kg)   SpO2 99%   BMI 24.80 kg/m   BP Readings from Last 3 Encounters:  05/31/17 112/74  02/16/17 119/80  09/16/16 114/87    Wt Readings from Last 3 Encounters:  05/31/17 140 lb (63.5 kg)  02/16/17 146 lb (66.2 kg)  09/16/16 151 lb (68.5 kg)    Physical Exam  Genitourinary: Pelvic exam was performed with patient supine. No labial fusion. There is no rash or tenderness on the right labia. There is no rash or tenderness on the left labia. Cervix exhibits discharge and friability. Right adnexum displays tenderness. Left adnexum displays tenderness. There is erythema and tenderness in the vagina. No bleeding in the vagina. Vaginal discharge found.    Lab Results  Component Value Date   WBC 4.3 09/15/2016   HGB 13.6 09/15/2016   HCT 40.4 09/15/2016   PLT 248.0 09/15/2016   GLUCOSE 85 03/21/2015   ALT 25 03/21/2015   AST 22 03/21/2015   NA 138 03/21/2015   K 4.0 03/21/2015   CL 108 03/21/2015   CREATININE 0.58 03/21/2015   BUN 17 03/21/2015   CO2 21 (L) 03/21/2015   TSH 1.04 09/15/2016    US Pelvis Transvanginal Non-ob (tv Only)  Result Date: 03/03/2017 CLINICAL DATA:  Dysfunctional uterine bleeding for months. EXAM: TRANSABDOMINAL AND TRANSVAGINAL ULTRASOUND OF PELVIS TECHNIQUE: Both transabdominal and transvaginal ultrasound examinations of the pelvis  were performed. Transabdominal technique was performed for global imaging of the pelvis including uterus, ovaries, adnexal regions, and pelvic cul-de-sac. It was necessary to proceed with endovaginal exam following the transabdominal exam to visualize the endometrium and ovaries. COMPARISON:  None FINDINGS: Uterus Measurements: 7.4 x 3.6 x 5 cm. No fibroids or other mass visualized. Endometrium  Thickness: 6 mm.  No focal abnormality visualized. Right ovary Measurements: 4.1 x 2.4 x 2.8 cm. Numerous small follicles throughout the right ovary. Left ovary Measurements: 4 x 2.3 x 2.1 cm. The left ovary is best seen transabdominally. Other findings No abnormal free fluid. IMPRESSION: No acute abnormalities. No cause for dysfunctional uterine bleeding identified. If bleeding remains unresponsive to hormonal or medical therapy, sonohysterogram should be considered for focal lesion work-up. (Ref: Radiological Reasoning: Algorithmic Workup of Abnormal Vaginal Bleeding with Endovaginal Sonography and Sonohysterography. AJR 2008; 798:X21-19) Electronically Signed   By: Dorise Bullion III M.D   On: 03/03/2017 19:46   US Pelvis (transabdominal Only)  Result Date: 03/03/2017 CLINICAL DATA:  Dysfunctional uterine bleeding for months. EXAM: TRANSABDOMINAL AND TRANSVAGINAL ULTRASOUND OF PELVIS TECHNIQUE: Both transabdominal and transvaginal ultrasound examinations of the pelvis were performed. Transabdominal technique was performed for global imaging of the pelvis including uterus, ovaries, adnexal regions, and pelvic cul-de-sac. It was necessary to proceed with endovaginal exam following the transabdominal exam to visualize the endometrium and ovaries. COMPARISON:  None FINDINGS: Uterus Measurements: 7.4 x 3.6 x 5 cm. No fibroids or other mass visualized. Endometrium Thickness: 6 mm.  No focal abnormality visualized. Right ovary Measurements: 4.1 x 2.4 x 2.8 cm. Numerous small follicles throughout the right ovary. Left ovary Measurements: 4 x 2.3 x 2.1 cm. The left ovary is best seen transabdominally. Other findings No abnormal free fluid. IMPRESSION: No acute abnormalities. No cause for dysfunctional uterine bleeding identified. If bleeding remains unresponsive to hormonal or medical therapy, sonohysterogram should be considered for focal lesion work-up. (Ref: Radiological Reasoning: Algorithmic Workup of Abnormal  Vaginal Bleeding with Endovaginal Sonography and Sonohysterography. AJR 2008; 417:E08-14) Electronically Signed   By: Dorise Bullion III M.D   On: 03/03/2017 19:46    Assessment & Plan:   Donnajean was seen today for vaginitis.  Diagnoses and all orders for this visit:  Vaginosis -     Cervicovaginal ancillary only -     POCT urine pregnancy -     metroNIDAZOLE (FLAGYL) 500 MG tablet; Take 4 tablets (2,000 mg total) by mouth once for 1 dose. with food -     fluconazole (DIFLUCAN) 150 MG tablet; Take 1 tablet (150 mg total) by mouth daily. Take second tab 3days apart from first tab -     MICONAZOLE NITRATE VAGINAL (MICONAZOLE 3) 4 % CREA; As directed on package  Unprotected sexual intercourse -     Cervicovaginal ancillary only -     POCT urine pregnancy   I have discontinued Kabrina Renteria Barrera's fluconazole. I am also having her start on metroNIDAZOLE, fluconazole, and MICONAZOLE NITRATE VAGINAL. Additionally, I am having her maintain her Norethindrone-Ethinyl Estradiol-Fe Biphas, multivitamin, and Norethindrone Acetate-Ethinyl Estrad-FE.  Meds ordered this encounter  Medications  . metroNIDAZOLE (FLAGYL) 500 MG tablet    Sig: Take 4 tablets (2,000 mg total) by mouth once for 1 dose. with food    Dispense:  4 tablet    Refill:  0    Order Specific Question:   Supervising Provider    Answer:   Lucille Passy [[id number]]  . fluconazole (DIFLUCAN) 150 MG tablet  Sig: Take 1 tablet (150 mg total) by mouth daily. Take second tab 3days apart from first tab    Dispense:  2 tablet    Refill:  0    Order Specific Question:   Supervising Provider    Answer:   Lucille Passy [[id number]]  . MICONAZOLE NITRATE VAGINAL (MICONAZOLE 3) 4 % CREA    Sig: As directed on package    Refill:  0    Order Specific Question:   Supervising Provider    Answer:   Lucille Passy [[id number]]    Follow-up: Return if symptoms worsen or fail to improve.  Wilfred Lacy, NP

## 2017-05-31 NOTE — Patient Instructions (Signed)
You will be contacted with lab results.   Cervicitis Cervicitis is when the cervix gets irritated and swollen. Your cervix is the lower end of your uterus. Follow these instructions at home:  Do not have sex until your doctor says it is okay.  Take over-the-counter and prescription medicines only as told by your doctor.  If you were prescribed an antibiotic medicine, take it as told by your doctor. Do not stop taking it even if you start to feel better.  Keep all follow-up visits as told by your doctor. This is important. Contact a doctor if:  Your symptoms come back after treatment.  Your symptoms get worse after treatment.  You have a fever.  You feel tired (fatigued).  Your belly (abdomen) hurts.  You feel like you are going to throw up (are nauseous).  You throw up (vomit).  You have watery poop (diarrhea).  Your back hurts. Get help right away if:  You have very bad pain in your belly, and medicine does not help it.  You cannot pee (urinate). Summary  Cervicitis is when the cervix gets irritated and swollen.  Do not have sex until your doctor says it is okay.  If you need to take an antibiotic, do not stop taking even if you start to feel better. Take medicines only as told by your doctor. This information is not intended to replace advice given to you by your health care provider. Make sure you discuss any questions you have with your health care provider. Document Released: 10/21/2007 Document Revised: 09/28/2015 Document Reviewed: 09/28/2015 Elsevier Interactive Patient Education  2017 [employer].

## 2017-06-01 LAB — CERVICOVAGINAL ANCILLARY ONLY
Bacterial vaginitis: NEGATIVE
Candida vaginitis: POSITIVE — AB
Chlamydia: NEGATIVE
Neisseria Gonorrhea: NEGATIVE
Trichomonas: NEGATIVE

## 2017-06-01 MED ORDER — FLUCONAZOLE 150 MG PO TABS: 150.0000 mg | ORAL_TABLET | Freq: Every day | ORAL | 0 refills | Status: DC

## 2017-06-01 MED ORDER — MICONAZOLE NITRATE 4 % VA CREA: TOPICAL_CREAM | VAGINAL | 0 refills | Status: DC

## 2017-06-01 NOTE — Addendum Note (Signed)
Addended by: Wilfred Lacy L on: 06/01/2017 05:01 PM   Modules accepted: Orders

## 2017-06-06 ENCOUNTER — Ambulatory Visit: Payer: BLUE CROSS/BLUE SHIELD | Admitting: Obstetrics & Gynecology

## 2017-07-05 ENCOUNTER — Ambulatory Visit: Payer: BLUE CROSS/BLUE SHIELD | Admitting: Family Medicine

## 2017-07-05 ENCOUNTER — Other Ambulatory Visit (HOSPITAL_COMMUNITY)
Admission: RE | Admit: 2017-07-05 | Discharge: 2017-07-05 | Disposition: A | Discharge status: Home / Self Care | Payer: BLUE CROSS/BLUE SHIELD | Source: Ambulatory Visit | Attending: Family Medicine | Admitting: Family Medicine

## 2017-07-05 ENCOUNTER — Encounter: Payer: Self-pay | Admitting: Family Medicine

## 2017-07-05 VITALS — BP 121/74 | HR 80 | Temp 98.6°F | Resp 18 | Wt 144.4 lb

## 2017-07-05 DIAGNOSIS — N898 Other specified noninflammatory disorders of vagina: Secondary | ICD-10-CM | POA: Diagnosis not present

## 2017-07-05 DIAGNOSIS — N912 Amenorrhea, unspecified: Secondary | ICD-10-CM

## 2017-07-05 LAB — POCT URINE PREGNANCY: Preg Test, Ur: POSITIVE — AB

## 2017-07-05 MED ORDER — PRENATAL VITAMIN 27-0.8 MG PO TABS: 1.0000 | ORAL_TABLET | Freq: Once | ORAL | Status: AC

## 2017-07-05 NOTE — Patient Instructions (Addendum)
Congratulations~~!!!! Due date 02/17/2018 As of today you are 7 weeks 4 days pregnant!!     Pregnancy Test Information What is a pregnancy test? A pregnancy test is used to detect the presence of human chorionic gonadotropin (hCG) in a sample of your urine or blood. hCG is a hormone produced by the cells of the placenta. The placenta is the organ that forms to nourish and support a developing baby. This test requires a sample of either blood or urine. A pregnancy test determines whether you are pregnant or not. How are pregnancy tests done? Pregnancy tests are done using a home pregnancy test or having a blood or urine test done at your health care provider's office. Home pregnancy tests require a urine sample.  Most kits use a plastic testing device with a strip of paper that indicates whether there is hCG in your urine.  Follow the test instructions very carefully.  After you urinate on the test stick, markings will appear to let you know whether you are pregnant.  For best results, use your first urine of the morning. That is when the concentration of hCG is highest.  Having a blood test to check for pregnancy requires a sample of blood drawn from a vein in your hand or arm. Your health care provider will send your sample to a lab for testing. Results of a pregnancy test will be positive or negative. Is one type of pregnancy test better than another? In some cases, a blood test will return a positive result even if a urine test was negative because blood tests are more sensitive. This means blood tests can detect hCG earlier than home pregnancy tests. How accurate are home pregnancy tests? Both types of pregnancy tests are very accurate.  A blood test is about 98% accurate.  When you are far enough along in your pregnancy and when used correctly, home pregnancy tests are equally accurate.  Can anything interfere with home pregnancy test results It is possible for certain  conditions to cause an inaccurate test result (false positive or falsenegative).  A false positive is a positive test result when you are not pregnant. This can happen if you: ? Are taking certain medicines, including anticonvulsants or tranquilizers. ? Have certain proteins in your blood.  A false negative is a negative test result when you are pregnant. This can happen if you: ? Took the test before there was enough hCG to detect. A pregnancy test will not be positive in most women until 3-4 weeks after conception. ? Drank a lot of liquid before the test. Diluted urine samples can sometimes give an inaccurate result. ? Take certain medicines, such as water pills (diuretics) or some antihistamines.  What should I do if I have a positive pregnancy test? If you have a positive pregnancy test, schedule an appointment with your health care provider. You might need additional testing to confirm the pregnancy. In the meantime, begin taking a prenatal vitamin, stop smoking, stop drinking alcohol, and do not use street drugs. Talk to your health care provider about how to take care of yourself during your pregnancy. Ask about what to expect from the care you will need throughout pregnancy (prenatal care). This information is not intended to replace advice given to you by your health care provider. Make sure you discuss any questions you have with your health care provider. Document Released: 01/14/2003 Document Revised: 12/09/2015 Document Reviewed: 05/08/2013 Elsevier Interactive Patient Education  [employer].

## 2017-07-05 NOTE — Assessment & Plan Note (Signed)
+   pregnancy pnv daily F/u ob edc 02/17/2018 ega [redacted] weeks 4 days

## 2017-07-05 NOTE — Progress Notes (Signed)
Subjective:  I acted as a Education administrator for [employer]. Yancey Flemings, [place]   Patient ID: Megan Webb, female    DOB: [birth year], 22 y.o.   MRN: [id number]  Chief Complaint  Patient presents with  . Vaginal Discharge     Patient is in today for vaginal discharge. Pt thinks she may be pregnant fdlmp 05/13/2017   edc 02/17/2018  ega [redacted] weeks 4 days  She also has a vaginal itch and d/c.    Patient Care Team: Shelda Pal, DO as PCP - General (Family Medicine)   Past Medical History:  Diagnosis Date  . Headache     Past Surgical History:  Procedure Laterality Date  . BUNIONECTOMY      Family History  Problem Relation Age of Onset  . Mental illness Mother        anxiety and depression  . Hypertension Mother   . Kidney disease Mother   . Anemia Mother   . Cataracts Father   . Asthma Brother   . Diabetes Maternal Aunt   . Cancer Maternal Aunt        breast  . Diabetes Maternal Uncle   . Arthritis Maternal Grandmother   . Stroke Maternal Grandmother   . Hypertension Maternal Grandmother   . Arthritis Maternal Grandfather   . Hypertension Maternal Grandfather   . Stroke Maternal Grandfather   . Arthritis Paternal Grandfather   . Stroke Paternal Grandfather     Social History   Socioeconomic History  . Marital status: Married    Spouse name: Not on file  . Number of children: 1  . Years of education: Not on file  . Highest education level: Not on file  Occupational History  . Not on file  Social Needs  . Financial resource strain: Not on file  . Food insecurity:    Worry: Not on file    Inability: Not on file  . Transportation needs:    Medical: Not on file    Non-medical: Not on file  Tobacco Use  . Smoking status: Never Smoker  . Smokeless tobacco: Never Used  Substance and Sexual Activity  . Alcohol use: No    Frequency: Never    Comment: occ  . Drug use: No    Frequency: 1.0 times per week    Types: Marijuana  . Sexual activity:  Yes    Birth control/protection: None  Lifestyle  . Physical activity:    Days per week: Not on file    Minutes per session: Not on file  . Stress: Not on file  Relationships  . Social connections:    Talks on phone: Not on file    Gets together: Not on file    Attends religious service: Not on file    Active member of club or organization: Not on file    Attends meetings of clubs or organizations: Not on file    Relationship status: Not on file  . Intimate partner violence:    Fear of current or ex partner: Not on file    Emotionally abused: Not on file    Physically abused: Not on file    Forced sexual activity: Not on file  Other Topics Concern  . Not on file  Social History Narrative  . Not on file    Outpatient Medications Prior to Visit  Medication Sig Dispense Refill  . fluconazole (DIFLUCAN) 150 MG tablet Take 1 tablet (150 mg total) by mouth daily. Take second tab 3days apart  from first tab 2 tablet 0  . MICONAZOLE NITRATE VAGINAL (MICONAZOLE 3) 4 % CREA As directed on package  0  . Multiple Vitamin (MULTIVITAMIN) tablet Take 1 tablet by mouth daily.    . Norethindrone Acetate-Ethinyl Estrad-FE (LOESTRIN 24 FE) 1-20 MG-MCG(24) tablet Take 1 tablet by mouth daily. 1 Package 11  . Norethindrone-Ethinyl Estradiol-Fe Biphas (LO LOESTRIN FE) 1 MG-10 MCG / 10 MCG tablet Take 1 tablet by mouth daily. 1 Package 11   No facility-administered medications prior to visit.     No Known Allergies  Review of Systems  Constitutional: Negative for chills, fever and malaise/fatigue.  HENT: Negative for congestion and hearing loss.   Eyes: Negative for discharge.  Respiratory: Negative for cough, sputum production and shortness of breath.   Cardiovascular: Negative for chest pain, palpitations and leg swelling.  Gastrointestinal: Negative for abdominal pain, blood in stool, constipation, diarrhea, heartburn, nausea and vomiting.  Genitourinary: Negative for dysuria, frequency,  hematuria and urgency.  Musculoskeletal: Negative for back pain, falls and myalgias.  Skin: Negative for rash.  Neurological: Negative for dizziness, sensory change, loss of consciousness, weakness and headaches.  Endo/Heme/Allergies: Negative for environmental allergies. Does not bruise/bleed easily.  Psychiatric/Behavioral: Negative for depression and suicidal ideas. The patient is not nervous/anxious and does not have insomnia.        Objective:    Physical Exam  Constitutional: She is oriented to person, place, and time. She appears well-developed and well-nourished.  HENT:  Head: Normocephalic and atraumatic.  Eyes: Conjunctivae and EOM are normal.  Neck: Normal range of motion. Neck supple. No JVD present. Carotid bruit is not present. No thyromegaly present.  Cardiovascular: Normal rate, regular rhythm and normal heart sounds.  No murmur heard. Pulmonary/Chest: Effort normal and breath sounds normal. No respiratory distress. She has no wheezes. She has no rales. She exhibits no tenderness.  Musculoskeletal: She exhibits no edema.  Neurological: She is alert and oriented to person, place, and time.  Psychiatric: She has a normal mood and affect.  Nursing note and vitals reviewed.   BP 121/74 (BP Location: Left Arm, Patient Position: Sitting, Cuff Size: Normal)   Pulse 80   Temp 98.6 F (37 C) (Oral)   Resp 18   Wt 144 lb 6.4 oz (65.5 kg)   LMP 05/19/2017 (Exact Date)   SpO2 100%   BMI 25.58 kg/m  Wt Readings from Last 3 Encounters:  07/05/17 144 lb 6.4 oz (65.5 kg)  05/31/17 140 lb (63.5 kg)  02/16/17 146 lb (66.2 kg)   BP Readings from Last 3 Encounters:  07/05/17 121/74  05/31/17 112/74  02/16/17 119/80     Immunization History  Administered Date(s) Administered  . Influenza-Unspecified 11/29/2014  . Tdap 12/31/2014    Health Maintenance  Topic Date Due  . INFLUENZA VACCINE  08/25/2017  . CHLAMYDIA SCREENING  06/01/2018  . PAP SMEAR  04/01/2019  .  TETANUS/TDAP  12/30/2024  . HIV Screening  Completed    Lab Results  Component Value Date   WBC 4.3 09/15/2016   HGB 13.6 09/15/2016   HCT 40.4 09/15/2016   PLT 248.0 09/15/2016   GLUCOSE 85 03/21/2015   ALT 25 03/21/2015   AST 22 03/21/2015   NA 138 03/21/2015   K 4.0 03/21/2015   CL 108 03/21/2015   CREATININE 0.58 03/21/2015   BUN 17 03/21/2015   CO2 21 (L) 03/21/2015   TSH 1.04 09/15/2016    Lab Results  Component Value Date   TSH  1.04 09/15/2016   Lab Results  Component Value Date   WBC 4.3 09/15/2016   HGB 13.6 09/15/2016   HCT 40.4 09/15/2016   MCV 89.3 09/15/2016   PLT 248.0 09/15/2016   Lab Results  Component Value Date   NA 138 03/21/2015   K 4.0 03/21/2015   CO2 21 (L) 03/21/2015   GLUCOSE 85 03/21/2015   BUN 17 03/21/2015   CREATININE 0.58 03/21/2015   BILITOT 0.3 03/21/2015   ALKPHOS 133 (H) 03/21/2015   AST 22 03/21/2015   ALT 25 03/21/2015   PROT 7.1 03/21/2015   ALBUMIN 3.4 (L) 03/21/2015   CALCIUM 9.2 03/21/2015   ANIONGAP 9 03/21/2015   No results found for: CHOL No results found for: HDL No results found for: LDLCALC No results found for: TRIG No results found for: CHOLHDL No results found for: HGBA1C       Assessment & Plan:   Problem List Items Addressed This Visit      Unprioritized   Amenorrhea    + pregnancy pnv daily F/u ob edc 02/17/2018 e[facility] [redacted] weeks 4 days        Relevant Medications   Prenatal Vit-Fe Fumarate-FA (PRENATAL VITAMIN) 27-0.8 MG TABS   Other Relevant Orders   Ambulatory referral to Obstetrics / Gynecology   Vaginal discharge - Primary   Relevant Orders   POCT urine pregnancy (Completed)   Urine cytology ancillary only      I have discontinued Avenell Renteria Barrera's Norethindrone-Ethinyl Estradiol-Fe Biphas, multivitamin, Norethindrone Acetate-Ethinyl Estrad-FE, fluconazole, and MICONAZOLE NITRATE VAGINAL. I am also having her start on Prenatal Vitamin.  Meds ordered this encounter    Medications  . Prenatal Vit-Fe Fumarate-FA (PRENATAL VITAMIN) 27-0.8 MG TABS    Sig: Take 1 tablet by mouth once for 1 dose.    Dispense:  30 tablet    CMA served as scribe during this visit. History, Physical and Plan performed by medical provider. Documentation and orders reviewed and attested to.  Ann Held, DO

## 2017-07-07 LAB — URINE CYTOLOGY ANCILLARY ONLY
Chlamydia: NEGATIVE
Neisseria Gonorrhea: NEGATIVE
Trichomonas: NEGATIVE

## 2017-07-08 ENCOUNTER — Encounter: Payer: Self-pay | Admitting: Family Medicine

## 2017-07-08 LAB — URINE CYTOLOGY ANCILLARY ONLY
Bacterial vaginitis: POSITIVE — AB
Candida vaginitis: NEGATIVE

## 2017-07-11 MED ORDER — METRONIDAZOLE 0.75 % VA GEL: 1.0000 | Freq: Every day | VAGINAL | 0 refills | Status: DC

## 2017-07-11 NOTE — Telephone Encounter (Deleted)
Result Notes for Urine cytology ancillary only   Notes recorded by Ann Held, DO on 07/08/2017 at 1:40 PM EDT metrogel 1 app pv qhs x 5 days  F/u gyn

## 2017-07-27 ENCOUNTER — Telehealth: Payer: Self-pay

## 2017-07-27 ENCOUNTER — Ambulatory Visit (INDEPENDENT_AMBULATORY_CARE_PROVIDER_SITE_OTHER): Payer: BLUE CROSS/BLUE SHIELD

## 2017-07-27 VITALS — BP 115/77 | HR 85

## 2017-07-27 DIAGNOSIS — N898 Other specified noninflammatory disorders of vagina: Secondary | ICD-10-CM | POA: Diagnosis not present

## 2017-07-27 DIAGNOSIS — Z113 Encounter for screening for infections with a predominantly sexual mode of transmission: Secondary | ICD-10-CM

## 2017-07-27 NOTE — Progress Notes (Signed)
Pt states that she was treated for BV 3 weeks ago by her PCP. Pt states that she is still having itching and burning with yellow discharge. A wet prep was ordered and sent to the lab.   Attestation of Attending Supervision of CMA/RN: Evaluation and management procedures were performed by the nurse under my supervision and collaboration.  I have reviewed the nursing note and chart, and I agree with the management and plan.  Carolyn L. Harraway-Smith, M.D., Cherlynn June

## 2017-07-27 NOTE — Telephone Encounter (Signed)
Pt called the office stating that she was treated for BV at her PCP 3 weeks ago. Pt states that she is still having yellow discharge. Pt states that she was told f/u with her OBGYN  because she is pregnant. Pt is scheduled to come in for a nurse visit on 07/27/17.

## 2017-07-28 LAB — CERVICOVAGINAL ANCILLARY ONLY
Bacterial vaginitis: POSITIVE — AB
Candida vaginitis: POSITIVE — AB
Chlamydia: NEGATIVE
Neisseria Gonorrhea: NEGATIVE
Trichomonas: NEGATIVE

## 2017-08-04 ENCOUNTER — Other Ambulatory Visit: Payer: Self-pay | Admitting: Family Medicine

## 2017-08-04 ENCOUNTER — Encounter: Payer: BLUE CROSS/BLUE SHIELD | Admitting: Family Medicine

## 2017-08-04 ENCOUNTER — Ambulatory Visit (INDEPENDENT_AMBULATORY_CARE_PROVIDER_SITE_OTHER): Payer: BLUE CROSS/BLUE SHIELD | Admitting: Family Medicine

## 2017-08-04 ENCOUNTER — Encounter: Payer: Self-pay | Admitting: Family Medicine

## 2017-08-04 VITALS — BP 109/80 | HR 83 | Wt 142.0 lb

## 2017-08-04 DIAGNOSIS — Z3687 Encounter for antenatal screening for uncertain dates: Secondary | ICD-10-CM | POA: Diagnosis not present

## 2017-08-04 DIAGNOSIS — Z3490 Encounter for supervision of normal pregnancy, unspecified, unspecified trimester: Secondary | ICD-10-CM

## 2017-08-04 DIAGNOSIS — Z8759 Personal history of other complications of pregnancy, childbirth and the puerperium: Secondary | ICD-10-CM

## 2017-08-04 DIAGNOSIS — N898 Other specified noninflammatory disorders of vagina: Secondary | ICD-10-CM

## 2017-08-04 DIAGNOSIS — Z3481 Encounter for supervision of other normal pregnancy, first trimester: Secondary | ICD-10-CM

## 2017-08-04 DIAGNOSIS — E041 Nontoxic single thyroid nodule: Secondary | ICD-10-CM | POA: Diagnosis not present

## 2017-08-04 DIAGNOSIS — F332 Major depressive disorder, recurrent severe without psychotic features: Secondary | ICD-10-CM

## 2017-08-04 DIAGNOSIS — Z3480 Encounter for supervision of other normal pregnancy, unspecified trimester: Secondary | ICD-10-CM

## 2017-08-04 MED ORDER — ASPIRIN EC 81 MG PO TBEC: 81.0000 mg | DELAYED_RELEASE_TABLET | Freq: Every day | ORAL | 2 refills | Status: DC

## 2017-08-04 MED ORDER — METRONIDAZOLE 500 MG PO TABS: 500.0000 mg | ORAL_TABLET | Freq: Two times a day (BID) | ORAL | 0 refills | Status: DC

## 2017-08-04 MED ORDER — FLUCONAZOLE 150 MG PO TABS: 150.0000 mg | ORAL_TABLET | Freq: Once | ORAL | 0 refills | Status: AC

## 2017-08-04 NOTE — Progress Notes (Signed)
Subjective:  Megan Webb is a G2P1001 38w6dbeing seen today for her first obstetrical visit.  Her obstetrical history is significant for pre-eclampsia. FOB involved. Patient does intend to breast feed. Pregnancy history fully reviewed.  Patient reports nausea.  BP 109/80   Pulse 83   Wt 142 lb (64.4 kg)   LMP 05/13/2017 (Exact Date)   BMI 25.15 kg/m   HISTORY: OB History  Gravida Para Term Preterm AB Living  '2 1 1 ' 0 0 1  SAB TAB Ectopic Multiple Live Births  0 0 0 0 1    # Outcome Date GA Lbr Len/2nd Weight Sex Delivery Anes PTL Lv  2 Current           1 Term 03/12/15 338w1d4:09 / 00:13 7 lb 4.6 oz (3.305 kg) F Vag-Spont EPI, Local  LIV    Past Medical History:  Diagnosis Date  . Headache     Past Surgical History:  Procedure Laterality Date  . BUNIONECTOMY      Family History  Problem Relation Age of Onset  . Mental illness Mother        anxiety and depression  . Hypertension Mother   . Kidney disease Mother   . Anemia Mother   . Cataracts Father   . Asthma Brother   . Diabetes Maternal Aunt   . Cancer Maternal Aunt        breast  . Diabetes Maternal Uncle   . Arthritis Maternal Grandmother   . Stroke Maternal Grandmother   . Hypertension Maternal Grandmother   . Arthritis Maternal Grandfather   . Hypertension Maternal Grandfather   . Stroke Maternal Grandfather   . Arthritis Paternal Grandfather   . Stroke Paternal Grandfather      Exam    Uterus:     Pelvic Exam:    Perineum: No Hemorrhoids, Normal Perineum   Vulva: normal, Bartholin's, Urethra, Skene's normal, perineum well healed   Vagina:  normal mucosa, moderate thin white discharge   Cervix: multiparous appearance, no cervical motion tenderness and no lesions   Adnexa: normal adnexa and no mass, fullness, tenderness   Bony Pelvis: gynecoid  System: Breast:  normal appearance, no masses or tenderness, Inspection negative, No nipple retraction or dimpling, No nipple discharge  or bleeding, No axillary or supraclavicular adenopathy, Normal to palpation without dominant masses   Skin: normal coloration and turgor, no rashes    Neurologic: gait normal; reflexes normal and symmetric   Extremities: normal strength, tone, and muscle mass, no deformities   HEENT PERRLA and extra ocular movement intact   Mouth/Teeth mucous membranes moist, pharynx normal without lesions   Neck supple and no masses, left lower nodule on thyroid   Cardiovascular: regular rate and rhythm, no murmurs or gallops   Respiratory:  appears well, vitals normal, no respiratory distress, acyanotic, normal RR, ear and throat exam is normal, neck free of mass or lymphadenopathy, chest clear, no wheezing, crepitations, rhonchi, normal symmetric air entry   Abdomen: soft, non-tender; bowel sounds normal; no masses,  no organomegaly   Urinary: urethral meatus normal      Assessment:    Pregnancy: G2P1001 Patient Active Problem List   Diagnosis Date Noted  . Supervision of other normal pregnancy, antepartum 08/04/2017  . Vaginal discharge 07/05/2017  . Cannabis abuse 08/16/2011  . History of suicide attempt 08/15/2011  . Recurrent major [place]-severe ([place]2013  . Anxiety 08/15/2011      Plan:   1. Supervision of other normal  pregnancy, antepartum FHT and FH normal  2. Prenatal care, antepartum - Obstetric Panel, Including HIV - Culture, OB Urine - Cystic Fibrosis Mutation 97 - SMN1 COPY NUMBER ANALYSIS (SMA Carrier Screen)  3. Vaginal discharge Recently positive for BV and yeast. Not treated. Flagyl and diflucan  4. History of pre-eclampsia Mild. Was on amlodipine postpartum. Screening labs. - Comp Met (CMET) - Protein / creatinine ratio, urine  5. Thyroid nodule - TSH  6. Severe episode of recurrent major depressive disorder, without psychotic features ([place]) Currently controlled off medications. Discussed that controlling depression with medication leads to improved  psycho-social development in newborns that uncontrolled.     Problem list reviewed and updated. 75% of 45 min visit spent on counseling and coordination of care.     Truett Mainland 08/04/2017

## 2017-08-04 NOTE — Progress Notes (Signed)
Last pap March 2018- neg.  DATING AND VIABILITY SONOGRAM   Kabao Leite is a 22 y.o. year old G2P1001 with LMP Patient's last menstrual period was 05/13/2017 (exact date). which would correlate to  [redacted]w[redacted]d weeks gestation.  She has irregular menstrual cycles.   She is here today for a confirmatory initial sonogram.    GESTATION: SINGLETON     FETAL ACTIVITY:          Heart rate        186          The fetus is active.   ADNEXA: The ovaries are normal.   GESTATIONAL AGE AND  BIOMETRICS:  Gestational criteria: Estimated Date of Delivery: 02/17/18 by early ultrasound now at 8w-6d  Previous Scans:0  GESTATIONAL SAC           4.01 cm        8-6 weeks  CROWN RUMP LENGTH          2.03  cm         8-5 weeks                                                                               AVERAGE EGA(BY THIS SCAN): 8-6 weeks  WORKING EDD( early ultrasound ):  03-10-2018      Kathrene Alu 08/04/2017 9:05 AM

## 2017-08-05 LAB — COMPREHENSIVE METABOLIC PANEL
ALT: 9 IU/L (ref 0–32)
AST: 14 IU/L (ref 0–40)
Albumin/Globulin Ratio: 1.6 (ref 1.2–2.2)
Albumin: 4.4 g/dL (ref 3.5–5.5)
Alkaline Phosphatase: 46 IU/L (ref 39–117)
BUN/Creatinine Ratio: 16 (ref 9–23)
BUN: 8 mg/dL (ref 6–20)
Bilirubin Total: 0.3 mg/dL (ref 0.0–1.2)
CO2: 18 mmol/L — ABNORMAL LOW (ref 20–29)
Calcium: 9.5 mg/dL (ref 8.7–10.2)
Chloride: 105 mmol/L (ref 96–106)
Creatinine, Ser: 0.51 mg/dL — ABNORMAL LOW (ref 0.57–1.00)
GFR calc Af Amer: 157 mL/min/{1.73_m2} (ref >59)
GFR calc non Af Amer: 136 mL/min/{1.73_m2} (ref >59)
Globulin, Total: 2.7 g/dL (ref 1.5–4.5)
Glucose: 79 mg/dL (ref 65–99)
Potassium: 4 mmol/L (ref 3.5–5.2)
Sodium: 138 mmol/L (ref 134–144)
Total Protein: 7.1 g/dL (ref 6.0–8.5)

## 2017-08-05 LAB — TSH: TSH: 0.896 u[IU]/mL (ref 0.450–4.500)

## 2017-08-05 LAB — PROTEIN / CREATININE RATIO, URINE
Creatinine, Urine: 41.9 mg/dL
Protein, Ur: 6.6 mg/dL
Protein/Creat Ratio: 158 mg/g creat (ref 0–200)

## 2017-08-06 LAB — URINE CULTURE, OB REFLEX

## 2017-08-06 LAB — CULTURE, OB URINE

## 2017-08-11 ENCOUNTER — Other Ambulatory Visit: Payer: Self-pay | Admitting: Obstetrics & Gynecology

## 2017-08-11 LAB — SMN1 COPY NUMBER ANALYSIS (SMA CARRIER SCREENING)

## 2017-08-11 LAB — OBSTETRIC PANEL, INCLUDING HIV
Antibody Screen: NEGATIVE
Basophils Absolute: 0 10*3/uL (ref 0.0–0.2)
Basos: 0 %
EOS (ABSOLUTE): 0 10*3/uL (ref 0.0–0.4)
Eos: 1 %
HIV Screen 4th Generation wRfx: NONREACTIVE
Hematocrit: 38 % (ref 34.0–46.6)
Hemoglobin: 12.8 g/dL (ref 11.1–15.9)
Hepatitis B Surface Ag: NEGATIVE
Immature Grans (Abs): 0 10*3/uL (ref 0.0–0.1)
Immature Granulocytes: 0 %
Lymphocytes Absolute: 1.6 10*3/uL (ref 0.7–3.1)
Lymphs: 21 %
MCH: 30.9 pg (ref 26.6–33.0)
MCHC: 33.7 g/dL (ref 31.5–35.7)
MCV: 92 fL (ref 79–97)
Monocytes Absolute: 0.5 10*3/uL (ref 0.1–0.9)
Monocytes: 7 %
Neutrophils Absolute: 5.3 10*3/uL (ref 1.4–7.0)
Neutrophils: 71 %
Platelets: 262 10*3/uL (ref 150–450)
RBC: 4.14 x10E6/uL (ref 3.77–5.28)
RDW: 14 % (ref 12.3–15.4)
RPR Ser Ql: NONREACTIVE
Rh Factor: POSITIVE
Rubella Antibodies, IGG: 3.45 index (ref >0.99)
WBC: 7.4 10*3/uL (ref 3.4–10.8)

## 2017-08-11 LAB — CYSTIC FIBROSIS MUTATION 97: Interpretation: NOT DETECTED

## 2017-08-18 ENCOUNTER — Telehealth: Payer: Self-pay

## 2017-08-18 NOTE — Telephone Encounter (Signed)
Pt called the office stating she has been breaking out in hives since Sunday. Pt states that she hasn't changed washing powder or soap. Pt states that she has taken Benadryl but it hasn't provided any relief. Pt is scheduled to come in to be evaluated on 08/19/17. Understanding was voiced.

## 2017-08-19 ENCOUNTER — Ambulatory Visit (INDEPENDENT_AMBULATORY_CARE_PROVIDER_SITE_OTHER): Payer: BLUE CROSS/BLUE SHIELD | Admitting: Obstetrics and Gynecology

## 2017-08-19 ENCOUNTER — Encounter: Payer: Self-pay | Admitting: Obstetrics and Gynecology

## 2017-08-19 VITALS — BP 119/78 | HR 85 | Wt 140.0 lb

## 2017-08-19 DIAGNOSIS — E041 Nontoxic single thyroid nodule: Secondary | ICD-10-CM | POA: Insufficient documentation

## 2017-08-19 DIAGNOSIS — L509 Urticaria, unspecified: Secondary | ICD-10-CM | POA: Diagnosis not present

## 2017-08-19 DIAGNOSIS — O09291 Supervision of pregnancy with other poor reproductive or obstetric history, first trimester: Secondary | ICD-10-CM

## 2017-08-19 DIAGNOSIS — F332 Major depressive disorder, recurrent severe without psychotic features: Secondary | ICD-10-CM

## 2017-08-19 DIAGNOSIS — O09299 Supervision of pregnancy with other poor reproductive or obstetric history, unspecified trimester: Secondary | ICD-10-CM | POA: Insufficient documentation

## 2017-08-19 DIAGNOSIS — Z3480 Encounter for supervision of other normal pregnancy, unspecified trimester: Secondary | ICD-10-CM

## 2017-08-19 DIAGNOSIS — Z3481 Encounter for supervision of other normal pregnancy, first trimester: Secondary | ICD-10-CM

## 2017-08-19 MED ORDER — CETIRIZINE HCL 10 MG PO TABS: 10.0000 mg | ORAL_TABLET | Freq: Every day | ORAL | 2 refills | Status: DC

## 2017-08-19 MED ORDER — HYDROCORTISONE 0.5 % EX CREA: 1.0000 "application " | TOPICAL_CREAM | Freq: Two times a day (BID) | CUTANEOUS | 0 refills | Status: DC

## 2017-08-19 NOTE — Progress Notes (Signed)
   PRENATAL VISIT NOTE  Subjective:  Megan Webb is a 22 y.o. G2P1001 at 12w0dbeing seen today for ongoing prenatal care.  She is currently monitored for the following issues for this high-risk pregnancy and has History of suicide attempt; Recurrent major depression-severe ([place]; Anxiety; Cannabis abuse; Vaginal discharge; Supervision of other normal pregnancy, antepartum; H/O pre-eclampsia in prior pregnancy, currently pregnant; and Thyroid nodule on their problem list.  Patient reports hives all over body since Sunday, not getting any worse or better, just moving around body, will beo n leg one day then move to back the next day, denies blistering.  Contractions: Not present. Vag. Bleeding: None.  Movement: Absent. Denies leaking of fluid.   The following portions of the patient's history were reviewed and updated as appropriate: allergies, current medications, past family history, past medical history, past social history, past surgical history and problem list. Problem list updated.  Objective:   Vitals:   08/19/17 0940  BP: 119/78  Pulse: 85  Weight: 140 lb 0.6 oz (63.5 kg)    Fetal Status: Fetal Heart Rate (bpm): 168   Movement: Absent     General:  Alert, oriented and cooperative. Patient is in no acute distress.  Skin: Skin is warm and dry. Multiple urticaria present over body, particularly arms and legs, with obvious scratching by patient, erythema present, no swelling or blistering, no excoriations, no pattern, no weeping, appearance of urticaria  Cardiovascular: Normal heart rate noted  Respiratory: Normal respiratory effort, no problems with respiration noted  Abdomen: Soft, gravid, appropriate for gestational age.  Pain/Pressure: Absent     Pelvic: Cervical exam deferred        Extremities: Normal range of motion.  Edema: None  Mental Status: Normal mood and affect. Normal behavior. Normal judgment and thought content.   Assessment and Plan:  Pregnancy:  G2P1001 at 158w0d1. Supervision of other normal pregnancy, antepartum  2. H/O pre-eclampsia in prior pregnancy, currently pregnant To start baby ASA 12 weeks  3. Thyroid nodule Normal TSH  5. Hives Patient with new onset hives over body, not worsening or improving over time but shifting. Had long conversation regarding possible exposures to new allergens and she denies any (animals, food, soaps, clothing, detergent, lotions, medication, sick contacts, plants). She has been taking benadryl without relief. Will start on cetirizine 10 mg daily and gave hydrocortisone cream for symptomatic relief, cautioned to use sparingly. She will return in one week if symptoms worsening. - Comp Met (CMET) - Bile acids, total  Preterm labor symptoms and general obstetric precautions including but not limited to vaginal bleeding, contractions, leaking of fluid and fetal movement were reviewed in detail with the patient. Please refer to After Visit Summary for other counseling recommendations.  Return in about 1 month (around 09/16/2017) for OB visit.  Future Appointments  Date Time Provider [place]8/08/2017  8:30 AM StTruett MainlandDO [facility] None    KeSloan LeiterMD

## 2017-08-19 NOTE — Progress Notes (Signed)
Pt states that she has been breaking in hives since Sunday and has been having for headaches for three months.

## 2017-08-22 LAB — BILE ACIDS, TOTAL: Bile Acids Total: <1 umol/L (ref 0.0–10.0)

## 2017-08-22 LAB — COMPREHENSIVE METABOLIC PANEL
ALT: 8 IU/L (ref 0–32)
AST: 16 IU/L (ref 0–40)
Albumin/Globulin Ratio: 1.8 (ref 1.2–2.2)
Albumin: 4.2 g/dL (ref 3.5–5.5)
Alkaline Phosphatase: 42 IU/L (ref 39–117)
BUN/Creatinine Ratio: 22 (ref 9–23)
BUN: 9 mg/dL (ref 6–20)
Bilirubin Total: 0.4 mg/dL (ref 0.0–1.2)
CO2: 17 mmol/L — ABNORMAL LOW (ref 20–29)
Calcium: 9.4 mg/dL (ref 8.7–10.2)
Chloride: 108 mmol/L — ABNORMAL HIGH (ref 96–106)
Creatinine, Ser: 0.41 mg/dL — ABNORMAL LOW (ref 0.57–1.00)
GFR calc Af Amer: 168 mL/min/{1.73_m2} (ref >59)
GFR calc non Af Amer: 146 mL/min/{1.73_m2} (ref >59)
Globulin, Total: 2.4 g/dL (ref 1.5–4.5)
Glucose: 78 mg/dL (ref 65–99)
Potassium: 4 mmol/L (ref 3.5–5.2)
Sodium: 138 mmol/L (ref 134–144)
Total Protein: 6.6 g/dL (ref 6.0–8.5)

## 2017-08-23 ENCOUNTER — Telehealth: Payer: Self-pay

## 2017-08-23 DIAGNOSIS — O26899 Other specified pregnancy related conditions, unspecified trimester: Secondary | ICD-10-CM

## 2017-08-23 DIAGNOSIS — N898 Other specified noninflammatory disorders of vagina: Secondary | ICD-10-CM

## 2017-08-23 MED ORDER — TERCONAZOLE 0.4 % VA CREA: 1.0000 | TOPICAL_CREAM | Freq: Every day | VAGINAL | 0 refills | Status: AC

## 2017-08-23 NOTE — Telephone Encounter (Signed)
Pt called the office stating that she was prescribed Claritin for itching and hives. Pt states that the Claritin is providing some relief but she is still having some itching. Pt also states that her yeast infection symptoms started again after sex. Pt states that she is having greenish yellow discharge and burning. Pt advised to continue taking Claritin until labs come back. Medication for yeast was sent to pharmacy. Understanding was voiced.

## 2017-09-01 ENCOUNTER — Ambulatory Visit (INDEPENDENT_AMBULATORY_CARE_PROVIDER_SITE_OTHER): Payer: BLUE CROSS/BLUE SHIELD | Admitting: Family Medicine

## 2017-09-01 VITALS — BP 103/77 | HR 77 | Wt 145.0 lb

## 2017-09-01 DIAGNOSIS — Z3480 Encounter for supervision of other normal pregnancy, unspecified trimester: Secondary | ICD-10-CM

## 2017-09-01 DIAGNOSIS — L501 Idiopathic urticaria: Secondary | ICD-10-CM

## 2017-09-01 DIAGNOSIS — Z3481 Encounter for supervision of other normal pregnancy, first trimester: Secondary | ICD-10-CM

## 2017-09-01 LAB — POCT URINALYSIS DIPSTICK OB
Glucose, UA: NEGATIVE — AB
POC,PROTEIN,UA: NEGATIVE

## 2017-09-01 NOTE — Progress Notes (Signed)
   PRENATAL VISIT NOTE  Subjective:  Megan Webb is a 22 y.o. G2P1001 at [redacted]w[redacted]d being seen today for ongoing prenatal care.  She is currently monitored for the following issues for this low-risk pregnancy and has History of suicide attempt; Recurrent major depression-severe ([place]); Anxiety; Cannabis abuse; Vaginal discharge; Supervision of other normal pregnancy, antepartum; H/O pre-eclampsia in prior pregnancy, currently pregnant; and Thyroid nodule on their problem list.  Patient reports continues to have urticaria. Has been taking zyrtec, which helps, but still getting them. Hasn't changed diet, soaps, lotions, etc. Urticaria can appear on arms, legs, chest, back, etc..  Contractions: Not present. Vag. Bleeding: None.   . Denies leaking of fluid.   The following portions of the patient's history were reviewed and updated as appropriate: allergies, current medications, past family history, past medical history, past social history, past surgical history and problem list. Problem list updated.  Objective:   Vitals:   09/01/17 0900  BP: 103/77  Pulse: 77  Weight: 145 lb (65.8 kg)    Fetal Status: Fetal Heart Rate (bpm): 164         General:  Alert, oriented and cooperative. Patient is in no acute distress.  Skin: Skin is warm and dry. No rash noted.   Cardiovascular: Normal heart rate noted  Respiratory: Normal respiratory effort, no problems with respiration noted  Abdomen: Soft, gravid, appropriate for gestational age.  Pain/Pressure: Present     Pelvic: Cervical exam deferred        Extremities: Normal range of motion.  Edema: None  Mental Status: Normal mood and affect. Normal behavior. Normal judgment and thought content.   Assessment and Plan:  Pregnancy: G2P1001 at [redacted]w[redacted]d  1. Supervision of other normal pregnancy, antepartum FHT and FH normal - [place] MFM OB COMP + 14 WK; Future - POC Urinalysis Dipstick OB  2. Idiopathic urticaria Continue antihistamine. Discussed  referral to allerginist. Could do steroid taper if long interval before she can see allerginist. - Ambulatory referral to Allergy  Preterm labor symptoms and general obstetric precautions including but not limited to vaginal bleeding, contractions, leaking of fluid and fetal movement were reviewed in detail with the patient. Please refer to After Visit Summary for other counseling recommendations.  No follow-ups on file.  Future Appointments  Date Time Provider [place]  10/05/2017  3:00 PM Donnamae Jude, MD [facility] None  10/14/2017 10:15 AM [facility] [place] 4 [facility]    Truett Mainland, DO

## 2017-09-01 NOTE — Progress Notes (Signed)
Patient still having hives that are bothersome. Patient has been taking zyrtec daily and noticed slight relief but hives are still bothersome. Kathrene Alu RN

## 2017-09-12 ENCOUNTER — Telehealth: Payer: Self-pay

## 2017-09-12 NOTE — Telephone Encounter (Signed)
Pt called the office stating that she has been having discharge that resembles cottage cheese. Pt states that she was treated for a yeast infection 2 weeks ago but the symptoms never went away. Pt is scheduled to come in for a self swab 09/12/17.

## 2017-09-12 NOTE — Telephone Encounter (Signed)
Return call made to patient. Left voicemail that I was returning her call. Kathrene Alu RN

## 2017-09-13 ENCOUNTER — Ambulatory Visit (INDEPENDENT_AMBULATORY_CARE_PROVIDER_SITE_OTHER): Payer: BLUE CROSS/BLUE SHIELD

## 2017-09-13 VITALS — BP 126/80 | HR 97 | Wt 146.0 lb

## 2017-09-13 DIAGNOSIS — B373 Candidiasis of vulva and vagina: Secondary | ICD-10-CM

## 2017-09-13 DIAGNOSIS — N898 Other specified noninflammatory disorders of vagina: Secondary | ICD-10-CM | POA: Diagnosis not present

## 2017-09-13 NOTE — Progress Notes (Signed)
Pt presents Pt states that she has been having white clumpy discharge and itching x 4 days. Pt states that her yeast infections symptoms never went away after treatment.Pt self swabbed and specimen was sent to the lab.

## 2017-09-14 LAB — CERVICOVAGINAL ANCILLARY ONLY
Bacterial vaginitis: NEGATIVE
Candida vaginitis: POSITIVE — AB

## 2017-10-05 ENCOUNTER — Ambulatory Visit (INDEPENDENT_AMBULATORY_CARE_PROVIDER_SITE_OTHER): Payer: BLUE CROSS/BLUE SHIELD | Admitting: Family Medicine

## 2017-10-05 VITALS — BP 114/74 | HR 109 | Wt 150.0 lb

## 2017-10-05 DIAGNOSIS — Z3482 Encounter for supervision of other normal pregnancy, second trimester: Secondary | ICD-10-CM

## 2017-10-05 DIAGNOSIS — B393 Disseminated histoplasmosis capsulati: Secondary | ICD-10-CM

## 2017-10-05 DIAGNOSIS — Z3480 Encounter for supervision of other normal pregnancy, unspecified trimester: Secondary | ICD-10-CM | POA: Diagnosis not present

## 2017-10-05 DIAGNOSIS — Z113 Encounter for screening for infections with a predominantly sexual mode of transmission: Secondary | ICD-10-CM | POA: Diagnosis not present

## 2017-10-05 DIAGNOSIS — O26892 Other specified pregnancy related conditions, second trimester: Secondary | ICD-10-CM

## 2017-10-05 DIAGNOSIS — N898 Other specified noninflammatory disorders of vagina: Secondary | ICD-10-CM

## 2017-10-05 MED ORDER — HYLAFEM VA SUPP: 1.0000 | VAGINAL | 1 refills | Status: DC

## 2017-10-05 MED ORDER — TERCONAZOLE 0.8 % VA CREA: 1.0000 | TOPICAL_CREAM | Freq: Every day | VAGINAL | 0 refills | Status: DC

## 2017-10-05 NOTE — Patient Instructions (Signed)

## 2017-10-05 NOTE — Progress Notes (Signed)
Patient complaining of pain on right side of vaginal area- the pain has been happening for about month now. Patient had yeast infection on 09/13/17 but no treatment- patient has had recurrent yeast in pregnancy. Kathrene Alu RN

## 2017-10-06 NOTE — Progress Notes (Signed)
   PRENATAL VISIT NOTE  Subjective:  Megan Webb is a 22 y.o. G2P1001 at [redacted]w[redacted]d being seen today for ongoing prenatal care.  She is currently monitored for the following issues for this low-risk pregnancy and has History of suicide attempt; Recurrent major depression-severe ([place]); Anxiety; Cannabis abuse; Vaginal discharge; Supervision of other normal pregnancy, antepartum; H/O pre-eclampsia in prior pregnancy, currently pregnant; and Thyroid nodule on their problem list.  Patient reports no complaints.  Contractions: Not present. Vag. Bleeding: None.  Movement: Present. Denies leaking of fluid.   The following portions of the patient's history were reviewed and updated as appropriate: allergies, current medications, past family history, past medical history, past social history, past surgical history and problem list. Problem list updated.  Objective:   Vitals:   10/05/17 1458  BP: 114/74  Pulse: (!) 109  Weight: 150 lb (68 kg)    Fetal Status: Fetal Heart Rate (bpm): 150   Movement: Present     General:  Alert, oriented and cooperative. Patient is in no acute distress.  Skin: Skin is warm and dry. No rash noted.   Cardiovascular: Normal heart rate noted  Respiratory: Normal respiratory effort, no problems with respiration noted  Abdomen: Soft, gravid, appropriate for gestational age.  Pain/Pressure: Present     Pelvic: Cervical exam deferred        Extremities: Normal range of motion.  Edema: None  Mental Status: Normal mood and affect. Normal behavior. Normal judgment and thought content.   Assessment and Plan:  Pregnancy: G2P1001 at [redacted]w[redacted]d  1. Supervision of other normal pregnancy, antepartum [place] screen today Scheduled for anatomy US - AFP TETRA  2. Vaginal discharge Check cultures - Cervicovaginal ancillary only - terconazole (TERAZOL 3) 0.8 % vaginal cream; Place 1 applicator vaginally at bedtime.  Dispense: 20 g; Refill: 0 - Homeopathic Products (HYLAFEM)  SUPP; Place 1 suppository vaginally 2 (two) times a week.  Dispense: 8 suppository; Refill: 1  Preterm labor symptoms and general obstetric precautions including but not limited to vaginal bleeding, contractions, leaking of fluid and fetal movement were reviewed in detail with the patient. Please refer to After Visit Summary for other counseling recommendations.  Return in 4 weeks (on 11/02/2017).  Future Appointments  Date Time Provider [place]  10/14/2017 10:15 AM [place] 4 [facility]  10/19/2017  2:30 PM Kennith Gain, MD [facility] None  11/02/2017  3:45 PM Lavonia Drafts, MD [facility] None    Donnamae Jude, MD

## 2017-10-07 ENCOUNTER — Encounter (HOSPITAL_COMMUNITY): Payer: Self-pay

## 2017-10-07 LAB — AFP TETRA
DIA Mom Value: 0.52
DIA Value (EIA): 88.57 pg/mL
DSR (By Age)    1 IN: 1071
DSR (Second Trimester) 1 IN: 10000
Gestational Age: 17.5 WEEKS
MSAFP Mom: 1.08
MSAFP: 42.7 ng/mL
MSHCG Mom: 0.6
MSHCG: 18502 m[IU]/mL
Maternal Age At EDD: 24 yr
Osb Risk: 9540
T18 (By Age): 1:4172 {titer}
Test Results:: NEGATIVE
Weight: 150 [lb_av]
uE3 Mom: 0.91
uE3 Value: 1.04 ng/mL

## 2017-10-07 LAB — CERVICOVAGINAL ANCILLARY ONLY
Bacterial vaginitis: NEGATIVE
Candida vaginitis: POSITIVE — AB
Trichomonas: NEGATIVE

## 2017-10-14 ENCOUNTER — Encounter: Payer: Self-pay | Admitting: Family Medicine

## 2017-10-14 ENCOUNTER — Other Ambulatory Visit: Payer: Self-pay | Admitting: Family Medicine

## 2017-10-14 ENCOUNTER — Ambulatory Visit (HOSPITAL_COMMUNITY)
Admission: RE | Admit: 2017-10-14 | Discharge: 2017-10-14 | Disposition: A | Discharge status: Home / Self Care | Payer: BLUE CROSS/BLUE SHIELD | Source: Ambulatory Visit | Attending: Family Medicine | Admitting: Family Medicine

## 2017-10-14 DIAGNOSIS — Z3A19 19 weeks gestation of pregnancy: Secondary | ICD-10-CM | POA: Insufficient documentation

## 2017-10-14 DIAGNOSIS — Z363 Encounter for antenatal screening for malformations: Secondary | ICD-10-CM | POA: Diagnosis not present

## 2017-10-14 DIAGNOSIS — O359XX0 Maternal care for (suspected) fetal abnormality and damage, unspecified, not applicable or unspecified: Secondary | ICD-10-CM | POA: Diagnosis not present

## 2017-10-14 DIAGNOSIS — Z3480 Encounter for supervision of other normal pregnancy, unspecified trimester: Secondary | ICD-10-CM | POA: Diagnosis present

## 2017-10-14 DIAGNOSIS — O4440 Low lying placenta NOS or without hemorrhage, unspecified trimester: Secondary | ICD-10-CM | POA: Insufficient documentation

## 2017-10-14 DIAGNOSIS — Z3482 Encounter for supervision of other normal pregnancy, second trimester: Secondary | ICD-10-CM | POA: Insufficient documentation

## 2017-10-14 DIAGNOSIS — O4442 Low lying placenta NOS or without hemorrhage, second trimester: Secondary | ICD-10-CM | POA: Diagnosis not present

## 2017-10-17 ENCOUNTER — Other Ambulatory Visit (HOSPITAL_COMMUNITY): Payer: Self-pay | Admitting: *Deleted

## 2017-10-17 DIAGNOSIS — O4443 Low lying placenta NOS or without hemorrhage, third trimester: Secondary | ICD-10-CM

## 2017-10-19 ENCOUNTER — Ambulatory Visit: Payer: Self-pay | Admitting: Allergy

## 2017-10-21 ENCOUNTER — Ambulatory Visit: Payer: Self-pay | Admitting: Allergy

## 2017-11-02 ENCOUNTER — Ambulatory Visit (INDEPENDENT_AMBULATORY_CARE_PROVIDER_SITE_OTHER): Payer: BLUE CROSS/BLUE SHIELD | Admitting: Obstetrics & Gynecology

## 2017-11-02 ENCOUNTER — Encounter: Payer: Self-pay | Admitting: Obstetrics & Gynecology

## 2017-11-02 VITALS — BP 122/77 | HR 97 | Wt 156.0 lb

## 2017-11-02 DIAGNOSIS — F1210 Cannabis abuse, uncomplicated: Secondary | ICD-10-CM

## 2017-11-02 DIAGNOSIS — Z915 Personal history of self-harm: Secondary | ICD-10-CM

## 2017-11-02 DIAGNOSIS — O4440 Low lying placenta NOS or without hemorrhage, unspecified trimester: Secondary | ICD-10-CM

## 2017-11-02 DIAGNOSIS — Z9151 Personal history of suicidal behavior: Secondary | ICD-10-CM

## 2017-11-02 DIAGNOSIS — F419 Anxiety disorder, unspecified: Secondary | ICD-10-CM

## 2017-11-02 DIAGNOSIS — Z23 Encounter for immunization: Secondary | ICD-10-CM

## 2017-11-02 DIAGNOSIS — Z3482 Encounter for supervision of other normal pregnancy, second trimester: Secondary | ICD-10-CM

## 2017-11-02 DIAGNOSIS — Z3480 Encounter for supervision of other normal pregnancy, unspecified trimester: Secondary | ICD-10-CM

## 2017-11-02 DIAGNOSIS — O09299 Supervision of pregnancy with other poor reproductive or obstetric history, unspecified trimester: Secondary | ICD-10-CM

## 2017-11-02 DIAGNOSIS — O4442 Low lying placenta NOS or without hemorrhage, second trimester: Secondary | ICD-10-CM

## 2017-11-02 DIAGNOSIS — E041 Nontoxic single thyroid nodule: Secondary | ICD-10-CM

## 2017-11-02 NOTE — Progress Notes (Signed)
   PRENATAL VISIT NOTE  Subjective:  Megan Webb is a 22 y.o. G2P1001 at [redacted]w[redacted]d being seen today for ongoing prenatal care.  She is currently monitored for the following issues for this high-risk pregnancy and has History of suicide attempt; Recurrent major depression-severe ([place]); Anxiety; Cannabis abuse; Vaginal discharge; Supervision of other normal pregnancy, antepartum; H/O pre-eclampsia in prior pregnancy, currently pregnant; Thyroid nodule; and Low-lying placenta on their problem list.  Patient reports no complaints.  Contractions: Not present. Vag. Bleeding: None.  Movement: Present. Denies leaking of fluid.   The following portions of the patient's history were reviewed and updated as appropriate: allergies, current medications, past family history, past medical history, past social history, past surgical history and problem list. Problem list updated.  Objective:   Vitals:   11/02/17 1544  BP: 122/77  Pulse: 97    Fetal Status: Fetal Heart Rate (bpm): 155   Movement: Present     General:  Alert, oriented and cooperative. Patient is in no acute distress.  Skin: Skin is warm and dry. No rash noted.   Cardiovascular: Normal heart rate noted  Respiratory: Normal respiratory effort, no problems with respiration noted  Abdomen: Soft, gravid, appropriate for gestational age.  Pain/Pressure: Present     Pelvic: Cervical exam deferred        Extremities: Normal range of motion.     Mental Status: Normal mood and affect. Normal behavior. Normal judgment and thought content.   Assessment and Plan:  Pregnancy: G2P1001 at [redacted]w[redacted]d  1. Supervision of other normal pregnancy, antepartum  2. Thyroid nodule  3. Low-lying placenta Has repeat  4. History of suicide attempt Pt reports that her mood is stable.    5. H/O pre-eclampsia in prior pregnancy, currently pregnant Taking baby ASA  6. Anxiety stable  Preterm labor symptoms and general obstetric precautions including  but not limited to vaginal bleeding, contractions, leaking of fluid and fetal movement were reviewed in detail with the patient. Please refer to After Visit Summary for other counseling recommendations.  Return in about 4 weeks (around 11/30/2017).  Future Appointments  Date Time Provider [place]  12/30/2017 12:45 PM [place] 2 [facility]    Lavonia Drafts, MD

## 2017-11-02 NOTE — Patient Instructions (Addendum)
Second Trimester of Pregnancy The second trimester is from week 13 through week 28, month 4 through 6. This is often the time in pregnancy that you feel your best. Often times, morning sickness has lessened or quit. You may have more energy, and you may get hungry more often. Your unborn baby (fetus) is growing rapidly. At the end of the sixth month, he or she is about 9 inches long and weighs about 1 pounds. You will likely feel the baby move (quickening) between 18 and 20 weeks of pregnancy. Follow these instructions at home:  Avoid all smoking, herbs, and alcohol. Avoid drugs not approved by your doctor.  Do not use any tobacco products, including cigarettes, chewing tobacco, and electronic cigarettes. If you need help quitting, ask your doctor. You may get counseling or other support to help you quit.  Only take medicine as told by your doctor. Some medicines are safe and some are not during pregnancy.  Exercise only as told by your doctor. Stop exercising if you start having cramps.  Eat regular, healthy meals.  Wear a good support bra if your breasts are tender.  Do not use hot tubs, steam rooms, or saunas.  Wear your seat belt when driving.  Avoid raw meat, uncooked cheese, and liter boxes and soil used by cats.  Take your prenatal vitamins.  Take 1500-2000 milligrams of calcium daily starting at the 20th week of pregnancy until you deliver your baby.  Try taking medicine that helps you poop (stool softener) as needed, and if your doctor approves. Eat more fiber by eating fresh fruit, vegetables, and whole grains. Drink enough fluids to keep your pee (urine) clear or pale yellow.  Take warm water baths (sitz baths) to soothe pain or discomfort caused by hemorrhoids. Use hemorrhoid cream if your doctor approves.  If you have puffy, bulging veins (varicose veins), wear support hose. Raise (elevate) your feet for 15 minutes, 3-4 times a day. Limit salt in your diet.  Avoid heavy  lifting, wear low heals, and sit up straight.  Rest with your legs raised if you have leg cramps or low back pain.  Visit your dentist if you have not gone during your pregnancy. Use a soft toothbrush to brush your teeth. Be gentle when you floss.  You can have sex (intercourse) unless your doctor tells you not to.  Go to your doctor visits. Get help if:  You feel dizzy.  You have mild cramps or pressure in your lower belly (abdomen).  You have a nagging pain in your belly area.  You continue to feel sick to your stomach (nauseous), throw up (vomit), or have watery poop (diarrhea).  You have bad smelling fluid coming from your vagina.  You have pain with peeing (urination). Get help right away if:  You have a fever.  You are leaking fluid from your vagina.  You have spotting or bleeding from your vagina.  You have severe belly cramping or pain.  You lose or gain weight rapidly.  You have trouble catching your breath and have chest pain.  You notice sudden or extreme puffiness (swelling) of your face, hands, ankles, feet, or legs.  You have not felt the baby move in over an hour.  You have severe headaches that do not go away with medicine.  You have vision changes. This information is not intended to replace advice given to you by your health care provider. Make sure you discuss any questions you have with your health care   provider. Document Released: 04/07/2009 Document Revised: 06/19/2015 Document Reviewed: 03/14/2012 Elsevier Interactive Patient Education  2017 [employer].  Exercise During Pregnancy For people of all ages, exercise is an important part of being healthy. Exercise improves heart and lung function and helps to maintain strength, flexibility, and a healthy body weight. Exercise also boosts energy levels and elevates mood. For most women, maintaining an exercise routine throughout pregnancy is recommended. It is only on rare occasions and with  certain medical conditions or pregnancy complications that women may be asked to limit or avoid exercise during pregnancy. What are some other benefits to exercising during pregnancy? Along with maintaining strength and flexibility, exercising throughout pregnancy can help to:  Keep strength in muscles that are very important during labor and childbirth.  Decrease low back pain during pregnancy.  Decrease the risk of developing gestational diabetes mellitus (GDM).  Improve blood sugar (glucose) control for women who have GDM.  Decrease the risk of developing preeclampsia. This is a serious condition that causes high blood pressure along with other symptoms, such as swelling and headaches.  Decrease the risk of cesarean delivery.  Speed up the recovery after giving birth.  How often should I exercise? Unless your health care provider gives you different instructions, you should try to exercise on most days or all days of the week. In general, try to exercise with moderate intensity for about 150 minutes per week. This can be spread out across several days, such as exercising for 30 minutes per day on 5 days of each week. You can tell that you are exercising at a moderate intensity if you have a higher heart rate and faster breathing, but you are still able to hold a conversation. What types of moderate-intensity exercise are recommended during pregnancy? There are many types of exercise that are safe for you to do during pregnancy. Unless your health care provider gives you different instructions, do a variety of exercises that safely increase your heart and breathing (cardiopulmonary) rates and help you to build and maintain muscle strength (strength training). You should always be able to talk in full sentences while exercising during pregnancy. Some examples of exercising that is safe to do during pregnancy include:  Brisk walking or hiking.  Swimming.  Water aerobics.  Riding a  stationary bike.  Strength training.  Modified yoga or Pilates. Tell your instructor that you are pregnant. Avoid overstretching and avoid lying on your back for long periods of time.  Running or jogging. Only choose this type of exercise if: ? You ran or jogged regularly before your pregnancy. ? You can run or jog and still talk in complete sentences.  What types of exercise should I not do during pregnancy? Depending on your level of fitness and whether you exercised regularly before your pregnancy, you may be advised to limit vigorous-intensity exercise during your pregnancy. You can tell that you are exercising at a vigorous intensity if you are breathing much harder and faster and cannot hold a conversation while exercising. Some examples of exercising that you should avoid during pregnancy include:  Contact sports.  Activities that place you at risk for falling on or being hit in the belly, such as downhill skiing, water skiing, surfing, rock climbing, cycling, gymnastics, and horseback riding.  Scuba diving.  Sky diving.  Yoga or Pilates in a room that is heated to extreme temperatures ("hot yoga" or "hot Pilates").  Jogging or running, unless you ran or jogged regularly before your pregnancy. While  jogging or running, you should always be able to talk in full sentences. Do not run or jog so vigorously that you are unable to have a conversation.  If you are not used to exercising at elevation (more than 6,000 feet above sea level), do not do so during your pregnancy.  When should I avoid exercising during pregnancy? Certain medical conditions can make it unsafe to exercise during pregnancy, or they may increase your risk of miscarriage or early labor and birth. Some of these conditions include:  Some types of heart disease.  Some types of lung disease.  Placenta previa. This is when the placenta partially or completely covers the opening of the uterus (cervix).  Frequent  bleeding from the vagina during your pregnancy.  Incompetent cervix. This is when your cervix does not remain as tightly closed during pregnancy as it should.  Premature labor.  Ruptured membranes. This is when the protective sac (amniotic sac) opens up and amniotic fluid leaks from your vagina.  Severely low blood count (anemia).  Preeclampsia or pregnancy-caused high blood pressure.  Carrying more than one baby (multiple gestation) and having an additional risk of early labor.  Poorly controlled diabetes.  Being severely underweight or severely overweight.  Intrauterine growth restriction. This is when your baby's growth and development during pregnancy are slower than expected.  Other medical conditions. Ask your health care provider if any apply to you.  What else should I know about exercising during pregnancy? You should take these precautions while exercising during pregnancy:  Avoid overheating. ? Wear loose-fitting, breathable clothes. ? Do not exercise in very high temperatures.  Avoid dehydration. Drink enough water before, during, and after exercise to keep your urine clear or pale yellow.  Avoid overstretching. Because of hormone changes during pregnancy, it is easy to overstretch muscles, tendons, and ligaments during pregnancy.  Start slowly and ask your health care provider to recommend types of exercise that are safe for you, if exercising regularly is new for you.  Pregnancy is not a time for exercising to lose weight. When should I seek medical care? You should stop exercising and call your health care provider if you have any unusual symptoms, such as:  Mild uterine contractions or abdominal cramping.  Dizziness that does not improve with rest.  When should I seek immediate medical care? You should stop exercising and call your local emergency services (911 in the [place] if you have any unusual symptoms, such as:  Sudden, severe pain in your low back  or your belly.  Uterine contractions or abdominal cramping that do not improve with rest.  Chest pain.  Bleeding or fluid leaking from your vagina.  Shortness of breath.  This information is not intended to replace advice given to you by your health care provider. Make sure you discuss any questions you have with your health care provider. Document Released: 01/11/2005 Document Revised: 06/11/2015 Document Reviewed: 03/21/2014 Elsevier Interactive Patient Education  [employer].

## 2017-11-21 ENCOUNTER — Telehealth: Payer: Self-pay

## 2017-11-21 DIAGNOSIS — N898 Other specified noninflammatory disorders of vagina: Secondary | ICD-10-CM

## 2017-11-21 MED ORDER — TERCONAZOLE 0.8 % VA CREA: 1.0000 | TOPICAL_CREAM | Freq: Every day | VAGINAL | 0 refills | Status: DC

## 2017-11-21 NOTE — Telephone Encounter (Signed)
Patient states that her vaginal itching and white clumpy discharge is getting worse. Patient states she tried Apple cider vingar with no relief. Will send in refill on teconozal. Kathrene Alu RN

## 2017-11-30 ENCOUNTER — Ambulatory Visit (INDEPENDENT_AMBULATORY_CARE_PROVIDER_SITE_OTHER): Payer: BLUE CROSS/BLUE SHIELD | Admitting: Obstetrics & Gynecology

## 2017-11-30 VITALS — BP 124/68 | HR 79 | Wt 164.0 lb

## 2017-11-30 DIAGNOSIS — Z3480 Encounter for supervision of other normal pregnancy, unspecified trimester: Secondary | ICD-10-CM

## 2017-11-30 DIAGNOSIS — O4442 Low lying placenta NOS or without hemorrhage, second trimester: Secondary | ICD-10-CM

## 2017-11-30 DIAGNOSIS — O09292 Supervision of pregnancy with other poor reproductive or obstetric history, second trimester: Secondary | ICD-10-CM

## 2017-11-30 DIAGNOSIS — O09299 Supervision of pregnancy with other poor reproductive or obstetric history, unspecified trimester: Secondary | ICD-10-CM

## 2017-11-30 DIAGNOSIS — O4440 Low lying placenta NOS or without hemorrhage, unspecified trimester: Secondary | ICD-10-CM

## 2017-11-30 DIAGNOSIS — F1210 Cannabis abuse, uncomplicated: Secondary | ICD-10-CM

## 2017-11-30 DIAGNOSIS — O99322 Drug use complicating pregnancy, second trimester: Secondary | ICD-10-CM

## 2017-11-30 DIAGNOSIS — Z3A25 25 weeks gestation of pregnancy: Secondary | ICD-10-CM

## 2017-11-30 NOTE — Patient Instructions (Signed)

## 2017-11-30 NOTE — Progress Notes (Signed)
   PRENATAL VISIT NOTE  Subjective:  Megan Webb is a 22 y.o. G2P1001 at [redacted]w[redacted]d being seen today for ongoing prenatal care.  She is currently monitored for the following issues for this low-risk pregnancy and has History of suicide attempt; Recurrent major depression-severe ([place]); Anxiety; Cannabis abuse; Vaginal discharge; Supervision of other normal pregnancy, antepartum; H/O pre-eclampsia in prior pregnancy, currently pregnant; Thyroid nodule; and Low-lying placenta on their problem list.  Patient reports no complaints. Denies leaking of fluid.   The following portions of the patient's history were reviewed and updated as appropriate: allergies, current medications, past family history, past medical history, past social history, past surgical history and problem list. Problem list updated.  Objective:  BP 124/68   Pulse 79   LMP 05/13/2017 (Exact Date)    Fetal Status:  General:  Alert, oriented and cooperative. Patient is in no acute distress.  Skin: Skin is warm and dry. No rash noted.   Cardiovascular: Normal heart rate noted  Respiratory: Normal respiratory effort, no problems with respiration noted  Abdomen: Soft, gravid, appropriate for gestational age.        Pelvic: Cervical exam deferred        Extremities: Normal range of motion.     Mental Status: Normal mood and affect. Normal behavior. Normal judgment and thought content.   Assessment and Plan:  Pregnancy: G2P1001 at [redacted]w[redacted]d  1. Supervision of other normal pregnancy, antepartum 2 hour GTT, RPR, CBC and HIV in 1 week    2. H/O pre-eclampsia in prior pregnancy, currently pregnant Pt taking baby ASA  3. Cannabis abuse  4. Low-lying placenta  Preterm labor symptoms and general obstetric precautions including but not limited to vaginal bleeding, contractions, leaking of fluid and fetal movement were reviewed in detail with the patient. Please refer to After Visit Summary for other counseling  recommendations.  Return in about 4 weeks (around 12/28/2017).  Future Appointments  Date Time Provider [place]  12/30/2017 12:45 PM [place] 2 [facility]    Lavonia Drafts, MD

## 2017-12-01 ENCOUNTER — Encounter: Payer: Self-pay | Admitting: Obstetrics & Gynecology

## 2017-12-07 ENCOUNTER — Other Ambulatory Visit: Payer: BLUE CROSS/BLUE SHIELD

## 2017-12-07 DIAGNOSIS — Z3480 Encounter for supervision of other normal pregnancy, unspecified trimester: Secondary | ICD-10-CM | POA: Diagnosis not present

## 2017-12-07 NOTE — Progress Notes (Addendum)
Pt was sent to lab for 2 hr GTT and 28 wk labs.   Attestation of Attending Supervision of RN: Evaluation and management procedures were performed by the nurse under my supervision and collaboration.  I have reviewed the nursing note and chart, and I agree with the management and plan.  Carolyn L. Harraway-Smith, M.D., Cherlynn June

## 2017-12-08 ENCOUNTER — Other Ambulatory Visit: Payer: Self-pay | Admitting: Obstetrics & Gynecology

## 2017-12-08 DIAGNOSIS — O99019 Anemia complicating pregnancy, unspecified trimester: Secondary | ICD-10-CM

## 2017-12-08 LAB — HIV ANTIBODY (ROUTINE TESTING W REFLEX): HIV Screen 4th Generation wRfx: NONREACTIVE

## 2017-12-08 LAB — CBC
Hematocrit: 31.4 % — ABNORMAL LOW (ref 34.0–46.6)
Hemoglobin: 10.7 g/dL — ABNORMAL LOW (ref 11.1–15.9)
MCH: 29.2 pg (ref 26.6–33.0)
MCHC: 34.1 g/dL (ref 31.5–35.7)
MCV: 86 fL (ref 79–97)
Platelets: 196 10*3/uL (ref 150–450)
RBC: 3.67 x10E6/uL — ABNORMAL LOW (ref 3.77–5.28)
RDW: 12.8 % (ref 12.3–15.4)
WBC: 8.1 10*3/uL (ref 3.4–10.8)

## 2017-12-08 LAB — GLUCOSE TOLERANCE, 2 HOURS W/ 1HR
Glucose, 1 hour: 137 mg/dL (ref 65–179)
Glucose, 2 hour: 117 mg/dL (ref 65–152)
Glucose, Fasting: 79 mg/dL (ref 65–91)

## 2017-12-08 LAB — RPR: RPR Ser Ql: NONREACTIVE

## 2017-12-08 MED ORDER — FERROUS SULFATE 325 (65 FE) MG PO TABS: 325.0000 mg | ORAL_TABLET | Freq: Two times a day (BID) | ORAL | 1 refills | Status: DC

## 2017-12-09 ENCOUNTER — Telehealth: Payer: Self-pay

## 2017-12-09 NOTE — Telephone Encounter (Signed)
Patient called and instructed to take an extra iron pill in addition to her prenatal viatmin. Instructed that she should take on an empty stomach with OJ. Patient warned this may cause constipation and darker colored stools. Patient states understanding. Kathrene Alu RN

## 2017-12-21 ENCOUNTER — Ambulatory Visit (INDEPENDENT_AMBULATORY_CARE_PROVIDER_SITE_OTHER): Payer: BLUE CROSS/BLUE SHIELD | Admitting: Obstetrics & Gynecology

## 2017-12-21 VITALS — BP 119/77 | HR 86 | Wt 167.0 lb

## 2017-12-21 DIAGNOSIS — O4440 Low lying placenta NOS or without hemorrhage, unspecified trimester: Secondary | ICD-10-CM

## 2017-12-21 DIAGNOSIS — O23599 Infection of other part of genital tract in pregnancy, unspecified trimester: Secondary | ICD-10-CM

## 2017-12-21 DIAGNOSIS — N760 Acute vaginitis: Secondary | ICD-10-CM

## 2017-12-21 DIAGNOSIS — Z3483 Encounter for supervision of other normal pregnancy, third trimester: Secondary | ICD-10-CM

## 2017-12-21 DIAGNOSIS — N898 Other specified noninflammatory disorders of vagina: Secondary | ICD-10-CM

## 2017-12-21 DIAGNOSIS — Z3480 Encounter for supervision of other normal pregnancy, unspecified trimester: Secondary | ICD-10-CM

## 2017-12-21 DIAGNOSIS — O23593 Infection of other part of genital tract in pregnancy, third trimester: Secondary | ICD-10-CM

## 2017-12-21 DIAGNOSIS — Z3A28 28 weeks gestation of pregnancy: Secondary | ICD-10-CM

## 2017-12-21 DIAGNOSIS — K429 Umbilical hernia without obstruction or gangrene: Secondary | ICD-10-CM

## 2017-12-21 DIAGNOSIS — O26899 Other specified pregnancy related conditions, unspecified trimester: Secondary | ICD-10-CM | POA: Diagnosis not present

## 2017-12-21 MED ORDER — BORIC ACID CRYS: 600.0000 mg | CRYSTALS | Freq: Every day | 5 refills | Status: DC

## 2017-12-21 MED ORDER — FLUCONAZOLE 150 MG PO TABS: 150.0000 mg | ORAL_TABLET | Freq: Once | ORAL | 3 refills | Status: AC

## 2017-12-21 MED ORDER — NYSTATIN 100000 UNIT/GM EX CREA: 1.0000 "application " | TOPICAL_CREAM | Freq: Every day | CUTANEOUS | 1 refills | Status: AC

## 2017-12-21 MED FILL — FLUCONAZOLE 150 MG TABS: 150 | 1 days supply | Qty: 1 | Fill #0

## 2017-12-21 MED FILL — NYSTATIN 100,000 UNIT/GM CR: 100000 | 15 days supply | Qty: 30 | Fill #0

## 2017-12-21 NOTE — Patient Instructions (Addendum)
Return to clinic for any scheduled appointments or obstetric concerns, or go to [facility] for evaluation   Umbilical Hernia, Adult A hernia is a bulge of tissue that pushes through an opening between muscles. An umbilical hernia happens in the abdomen, near the belly button (umbilicus). The hernia may contain tissues from the small intestine, large intestine, or fatty tissue covering the intestines (omentum). Umbilical hernias in adults tend to get worse over time, and they require surgical treatment. There are several types of umbilical hernias. You may have:  A hernia located just above or below the umbilicus (indirect hernia). This is the most common type of umbilical hernia in adults.  A hernia that forms through an opening formed by the umbilicus (direct hernia).  A hernia that comes and goes (reducible hernia). A reducible hernia may be visible only when you strain, lift something heavy, or cough. This type of hernia can be pushed back into the abdomen (reduced).  A hernia that traps abdominal tissue inside the hernia (incarcerated hernia). This type of hernia cannot be reduced.  A hernia that cuts off blood flow to the tissues inside the hernia (strangulated hernia). The tissues can start to die if this happens. This type of hernia requires emergency treatment.  What are the causes? An umbilical hernia happens when tissue inside the abdomen presses on a weak area of the abdominal muscles. What increases the risk? You may have a greater risk of this condition if you:  Are obese.  Have had several pregnancies.  Have a buildup of fluid inside your abdomen (ascites).  Have had surgery that weakens the abdominal muscles.  What are the signs or symptoms? The main symptom of this condition is a painless bulge at or near the belly button. A reducible hernia may be visible only when you strain, lift something heavy, or cough. Other symptoms may include:  Dull pain.  A feeling of  pressure.  Symptoms of a strangulated hernia may include:  Pain that gets increasingly worse.  Nausea and vomiting.  Pain when pressing on the hernia.  Skin over the hernia becoming red or purple.  Constipation.  Blood in the stool.  How is this diagnosed? This condition may be diagnosed based on:  A physical exam. You may be asked to cough or strain while standing. These actions increase the pressure inside your abdomen and force the hernia through the opening in your muscles. Your health care provider may try to reduce the hernia by pressing on it.  Your symptoms and medical history.  How is this treated? Surgery is the only treatment for an umbilical hernia. Surgery for a strangulated hernia is done as soon as possible. If you have a small hernia that is not incarcerated, you may need to lose weight before having surgery. Follow these instructions at home:  Lose weight, if told by your health care provider.  Do not try to push the hernia back in.  Watch your hernia for any changes in color or size. Tell your health care provider if any changes occur.  You may need to avoid activities that increase pressure on your hernia.  Do not lift anything that is heavier than 10 lb (4.5 kg) until your health care provider says that this is safe.  Take over-the-counter and prescription medicines only as told by your health care provider.  Keep all follow-up visits as told by your health care provider. This is important. Contact a health care provider if:  Your hernia gets larger.  Your hernia becomes painful. Get help right away if:  You develop sudden, severe pain near the area of your hernia.  You have pain as well as nausea or vomiting.  You have pain and the skin over your hernia changes color.  You develop a fever. This information is not intended to replace advice given to you by your health care provider. Make sure you discuss any questions you have with your health  care provider. Document Released: 06/13/2015 Document Revised: 09/14/2015 Document Reviewed: 06/13/2015 Elsevier Interactive Patient Education  [employer].

## 2017-12-21 NOTE — Progress Notes (Signed)
PRENATAL VISIT NOTE  Subjective:  Megan Webb is a 22 y.o. G2P1001 at [redacted]w[redacted]d being seen today for ongoing prenatal care.  She is currently monitored for the following issues for this low-risk pregnancy and has History of suicide attempt; Recurrent major depression-severe ([place]); Anxiety; Cannabis abuse; Supervision of other normal pregnancy, antepartum; H/O pre-eclampsia in prior pregnancy, currently pregnant; Thyroid nodule; Low-lying placenta; and Recurrent vaginitis complicating current pregnancy on their problem list.  Patient reports vaginal irritation that has been persistent for a few days. History of multiple yeast infections (also diagnosed on multiple tests in her chart), has been treated with Diflucan and multiple regimens of Terazol in different strengths. Awaiting Boric Acid capsules to help with this, also takes probiotic supplements. Already does not use soap to wash her vulva, wears cotton underwear only and practices other proper vulvar hygiene practices. She is frustrated now, has so much irritation that it burns during urination. Wants to know what else she can do. Also reports recurrence of umbilical hernia, had this last pregnancy, and it is starting to hurt. No gastrointestinal symptoms.  Contractions: Not present. Vag. Bleeding: None.  Movement: Present. Denies leaking of fluid.   The following portions of the patient's history were reviewed and updated as appropriate: allergies, current medications, past family history, past medical history, past social history, past surgical history and problem list. Problem list updated.  Objective:   Vitals:   12/21/17 1453  BP: 119/77  Pulse: 86  Weight: 167 lb (75.8 kg)    Fetal Status: Fetal Heart Rate (bpm): 154 Fundal Height: 30 cm Movement: Present     General:  Alert, oriented and cooperative. Patient is in no acute distress.  Skin: Skin is warm and dry. No rash noted.   Cardiovascular: Normal heart rate noted    Respiratory: Normal respiratory effort, no problems with respiration noted  Abdomen: Soft, gravid, appropriate for gestational age. Small umbilical hernia noted, mildly tender, no erythema. Pain/Pressure: Absent     Pelvic: Erythematous upper aspect of introitus in the periurethral area with some superficial open lines in the area, very tender to touch. Clumpy yellow discharge seen, testing sample obtained.  Edematous introitus.  Extremities: Normal range of motion.  Edema: None  Mental Status: Normal mood and affect. Normal behavior. Normal judgment and thought content.   Assessment and Plan:  Pregnancy: G2P1001 at [redacted]w[redacted]d  1. Vaginal discharge 2. Recurrent vaginitis complicating current pregnancy Concerned about Candida glabrata or other azole resistant strain.  Also concerned about subclinical HSV. Cultures done. Will treat with Nystatin and Diflucan (C.glabrata can be treated with nystatin sometimes).  NuSwab sent, this is more sensitive that the usual test ordered. HSV test also sent. Printed out Rx for boric acid capsules as she has not been able to get the Hylafem. told to go to [facility] (compounding pharmacy) - NuSwab VG+, Candida 6sp - Herpes simplex virus culture - Boric Acid CRYS; Place 600 mg vaginally at bedtime. Use vaginally every night for two weeks then twice a week  Dispense: 500 g; Refill: 5 - nystatin cream (MYCOSTATIN); Apply 1 application topically at bedtime for 14 days.  Dispense: 30 g; Refill: 1 - fluconazole (DIFLUCAN) 150 MG tablet; Take 1 tablet (150 mg total) by mouth once for 1 dose. Can take additional dose three days later if symptoms persist  Dispense: 1 tablet; Refill: 3  3. Umbilical hernia without obstruction and without gangrene No concerning symptoms. Recommended to call/come in for worsening symptoms.  4.  Low-lying placenta Noted on anatomy scan, will reevaluate on scan next week  5. Supervision of other normal pregnancy, antepartum Preterm  labor symptoms and general obstetric precautions including but not limited to vaginal bleeding, contractions, leaking of fluid and fetal movement were reviewed in detail with the patient. Please refer to After Visit Summary for other counseling recommendations.  Return in about 2 weeks (around 01/04/2018) for OB Visit.  Future Appointments  Date Time Provider [place]  12/30/2017 12:45 PM [place] 2 [facility]-MFC[facility]-US    Verita Schneiders, MD

## 2017-12-24 LAB — HERPES SIMPLEX VIRUS CULTURE

## 2017-12-26 LAB — NUSWAB VG+, CANDIDA 6SP
Candida albicans, NAA: POSITIVE — AB
Candida glabrata, NAA: NEGATIVE
Candida krusei, NAA: NEGATIVE
Candida lusitaniae, NAA: NEGATIVE
Candida parapsilosis, NAA: NEGATIVE
Candida tropicalis, NAA: NEGATIVE
Chlamydia trachomatis, NAA: NEGATIVE
Neisseria gonorrhoeae, NAA: NEGATIVE
Trich vag by NAA: NEGATIVE

## 2017-12-30 ENCOUNTER — Ambulatory Visit (HOSPITAL_COMMUNITY)
Admission: RE | Admit: 2017-12-30 | Discharge: 2017-12-30 | Disposition: A | Discharge status: Home / Self Care | Payer: BLUE CROSS/BLUE SHIELD | Source: Ambulatory Visit | Attending: Family Medicine | Admitting: Family Medicine

## 2017-12-30 ENCOUNTER — Encounter: Payer: BLUE CROSS/BLUE SHIELD | Admitting: Family Medicine

## 2017-12-30 DIAGNOSIS — Z3A30 30 weeks gestation of pregnancy: Secondary | ICD-10-CM | POA: Insufficient documentation

## 2017-12-30 DIAGNOSIS — Z362 Encounter for other antenatal screening follow-up: Secondary | ICD-10-CM | POA: Diagnosis not present

## 2017-12-30 DIAGNOSIS — O4443 Low lying placenta NOS or without hemorrhage, third trimester: Secondary | ICD-10-CM | POA: Diagnosis not present

## 2017-12-30 DIAGNOSIS — O359XX0 Maternal care for (suspected) fetal abnormality and damage, unspecified, not applicable or unspecified: Secondary | ICD-10-CM | POA: Diagnosis not present

## 2018-01-09 ENCOUNTER — Ambulatory Visit (INDEPENDENT_AMBULATORY_CARE_PROVIDER_SITE_OTHER): Payer: BLUE CROSS/BLUE SHIELD | Admitting: Family Medicine

## 2018-01-09 VITALS — BP 127/82 | HR 105 | Wt 172.0 lb

## 2018-01-09 DIAGNOSIS — O09299 Supervision of pregnancy with other poor reproductive or obstetric history, unspecified trimester: Secondary | ICD-10-CM

## 2018-01-09 DIAGNOSIS — N760 Acute vaginitis: Secondary | ICD-10-CM

## 2018-01-09 DIAGNOSIS — O09293 Supervision of pregnancy with other poor reproductive or obstetric history, third trimester: Secondary | ICD-10-CM

## 2018-01-09 DIAGNOSIS — O4440 Low lying placenta NOS or without hemorrhage, unspecified trimester: Secondary | ICD-10-CM

## 2018-01-09 DIAGNOSIS — O23593 Infection of other part of genital tract in pregnancy, third trimester: Secondary | ICD-10-CM

## 2018-01-09 DIAGNOSIS — Z3480 Encounter for supervision of other normal pregnancy, unspecified trimester: Secondary | ICD-10-CM

## 2018-01-09 DIAGNOSIS — O23599 Infection of other part of genital tract in pregnancy, unspecified trimester: Secondary | ICD-10-CM

## 2018-01-09 DIAGNOSIS — O4443 Low lying placenta NOS or without hemorrhage, third trimester: Secondary | ICD-10-CM

## 2018-01-09 NOTE — Progress Notes (Signed)
   PRENATAL VISIT NOTE  Subjective:  Megan Webb is a 22 y.o. G2P1001 at [redacted]w[redacted]d being seen today for ongoing prenatal care.  She is currently monitored for the following issues for this low-risk pregnancy and has History of suicide attempt; Recurrent major depression-severe ([place]); Anxiety; Cannabis abuse; Supervision of other normal pregnancy, antepartum; H/O pre-eclampsia in prior pregnancy, currently pregnant; Thyroid nodule; Low-lying placenta; Recurrent vaginitis complicating current pregnancy; and Umbilical hernia on their problem list.  Patient reports feels that vaginitis improved with diflucan and boric acid. Will continue for now..  Contractions: Not present. Vag. Bleeding: None.  Movement: Present. Denies leaking of fluid.   The following portions of the patient's history were reviewed and updated as appropriate: allergies, current medications, past family history, past medical history, past social history, past surgical history and problem list. Problem list updated.  Objective:   Vitals:   01/09/18 1013  BP: 127/82  Pulse: (!) 105  Weight: 172 lb (78 kg)    Fetal Status: Fetal Heart Rate (bpm): 152 Fundal Height: 33 cm Movement: Present     General:  Alert, oriented and cooperative. Patient is in no acute distress.  Skin: Skin is warm and dry. No rash noted.   Cardiovascular: Normal heart rate noted  Respiratory: Normal respiratory effort, no problems with respiration noted  Abdomen: Soft, gravid, appropriate for gestational age.  Pain/Pressure: Absent     Pelvic: Cervical exam deferred        Extremities: Normal range of motion.  Edema: None  Mental Status: Normal mood and affect. Normal behavior. Normal judgment and thought content.   Assessment and Plan:  Pregnancy: G2P1001 at [redacted]w[redacted]d  1. Supervision of other normal pregnancy, antepartum FHT and FH normal  2. Low-lying placenta Last [place] does not give a measurement from cervix. Will discuss with MFM.  3.  Recurrent vaginitis complicating current pregnancy Continue boric acid.  4. H/O pre-eclampsia in prior pregnancy, currently pregnant Cont ASA.  Preterm labor symptoms and general obstetric precautions including but not limited to vaginal bleeding, contractions, leaking of fluid and fetal movement were reviewed in detail with the patient. Please refer to After Visit Summary for other counseling recommendations.  Return in about 2 weeks (around 01/23/2018) for OB f/u.  No future appointments.  Truett Mainland, DO

## 2018-01-12 ENCOUNTER — Other Ambulatory Visit: Payer: Self-pay | Admitting: Family Medicine

## 2018-01-12 DIAGNOSIS — O4440 Low lying placenta NOS or without hemorrhage, unspecified trimester: Secondary | ICD-10-CM

## 2018-01-19 ENCOUNTER — Ambulatory Visit (HOSPITAL_COMMUNITY): Payer: BLUE CROSS/BLUE SHIELD

## 2018-01-20 ENCOUNTER — Ambulatory Visit (INDEPENDENT_AMBULATORY_CARE_PROVIDER_SITE_OTHER): Payer: BLUE CROSS/BLUE SHIELD | Admitting: Family Medicine

## 2018-01-20 ENCOUNTER — Encounter: Payer: Self-pay | Admitting: Family Medicine

## 2018-01-20 VITALS — BP 107/72 | HR 88 | Wt 171.0 lb

## 2018-01-20 DIAGNOSIS — Z3480 Encounter for supervision of other normal pregnancy, unspecified trimester: Secondary | ICD-10-CM

## 2018-01-20 DIAGNOSIS — Z3483 Encounter for supervision of other normal pregnancy, third trimester: Secondary | ICD-10-CM

## 2018-01-20 DIAGNOSIS — O4443 Low lying placenta NOS or without hemorrhage, third trimester: Secondary | ICD-10-CM

## 2018-01-20 DIAGNOSIS — O4440 Low lying placenta NOS or without hemorrhage, unspecified trimester: Secondary | ICD-10-CM

## 2018-01-20 DIAGNOSIS — O09299 Supervision of pregnancy with other poor reproductive or obstetric history, unspecified trimester: Secondary | ICD-10-CM

## 2018-01-20 DIAGNOSIS — O09293 Supervision of pregnancy with other poor reproductive or obstetric history, third trimester: Secondary | ICD-10-CM

## 2018-01-20 NOTE — Progress Notes (Signed)
   PRENATAL VISIT NOTE  Subjective:  Megan Webb is a 22 y.o. G2P1001 at [redacted]w[redacted]d being seen today for ongoing prenatal care.  She is currently monitored for the following issues for this low-risk pregnancy and has History of suicide attempt; Recurrent major depression-severe ([place]); Anxiety; Cannabis abuse; Supervision of other normal pregnancy, antepartum; H/O pre-eclampsia in prior pregnancy, currently pregnant; Thyroid nodule; Low-lying placenta; Recurrent vaginitis complicating current pregnancy; and Umbilical hernia on their problem list.  Patient reports no complaints.  Contractions: Not present. Vag. Bleeding: None.  Movement: Present. Denies leaking of fluid.   The following portions of the patient's history were reviewed and updated as appropriate: allergies, current medications, past family history, past medical history, past social history, past surgical history and problem list. Problem list updated.  Objective:   Vitals:   01/20/18 0918  BP: 107/72  Pulse: 88  Weight: 171 lb (77.6 kg)    Fetal Status: Fetal Heart Rate (bpm): 145   Movement: Present     General:  Alert, oriented and cooperative. Patient is in no acute distress.  Skin: Skin is warm and dry. No rash noted.   Cardiovascular: Normal heart rate noted  Respiratory: Normal respiratory effort, no problems with respiration noted  Abdomen: Soft, gravid, appropriate for gestational age.  Pain/Pressure: Absent     Pelvic: Cervical exam deferred        Extremities: Normal range of motion.  Edema: Trace  Mental Status: Normal mood and affect. Normal behavior. Normal judgment and thought content.   Assessment and Plan:  Pregnancy: G2P1001 at [redacted]w[redacted]d  1. Supervision of other normal pregnancy, antepartum FHT and FH normal  2. Low-lying placenta [place] on 12/30 for placenta placement  3. H/O pre-eclampsia in prior pregnancy, currently pregnant Continue ASA. BP normal  Preterm labor symptoms and general  obstetric precautions including but not limited to vaginal bleeding, contractions, leaking of fluid and fetal movement were reviewed in detail with the patient. Please refer to After Visit Summary for other counseling recommendations.  No follow-ups on file.  Future Appointments  Date Time Provider [place]  01/23/2018 11:30 AM [facility]-MFC [place] 1 [facility]-MFCUS MFC-US    Jacob J Stinson, DO

## 2018-01-23 ENCOUNTER — Other Ambulatory Visit: Payer: Self-pay | Admitting: Family Medicine

## 2018-01-23 ENCOUNTER — Ambulatory Visit (HOSPITAL_COMMUNITY)
Admission: RE | Admit: 2018-01-23 | Discharge: 2018-01-23 | Disposition: A | Discharge status: Home / Self Care | Payer: BLUE CROSS/BLUE SHIELD | Source: Ambulatory Visit | Attending: Family Medicine | Admitting: Family Medicine

## 2018-01-23 ENCOUNTER — Encounter (HOSPITAL_COMMUNITY): Payer: Self-pay

## 2018-01-23 ENCOUNTER — Other Ambulatory Visit (HOSPITAL_COMMUNITY): Payer: Self-pay | Admitting: *Deleted

## 2018-01-23 DIAGNOSIS — O4440 Low lying placenta NOS or without hemorrhage, unspecified trimester: Secondary | ICD-10-CM

## 2018-01-23 DIAGNOSIS — O403XX0 Polyhydramnios, third trimester, not applicable or unspecified: Secondary | ICD-10-CM

## 2018-01-23 DIAGNOSIS — O09299 Supervision of pregnancy with other poor reproductive or obstetric history, unspecified trimester: Secondary | ICD-10-CM | POA: Diagnosis not present

## 2018-01-23 DIAGNOSIS — O4443 Low lying placenta NOS or without hemorrhage, third trimester: Secondary | ICD-10-CM | POA: Diagnosis not present

## 2018-01-23 DIAGNOSIS — O409XX0 Polyhydramnios, unspecified trimester, not applicable or unspecified: Secondary | ICD-10-CM

## 2018-01-23 DIAGNOSIS — E041 Nontoxic single thyroid nodule: Secondary | ICD-10-CM | POA: Diagnosis not present

## 2018-01-23 DIAGNOSIS — Z3A33 33 weeks gestation of pregnancy: Secondary | ICD-10-CM | POA: Diagnosis not present

## 2018-01-25 DIAGNOSIS — K429 Umbilical hernia without obstruction or gangrene: Secondary | ICD-10-CM

## 2018-01-25 HISTORY — DX: Umbilical hernia without obstruction or gangrene: K42.9

## 2018-01-25 NOTE — L&D Delivery Note (Signed)
Delivery Note At 3:38 PM a viable female was delivered via Vaginal, Spontaneous (Presentation: vertex, OA).  APGAR: 9, 9; weight: PENDING.   Placenta status: delivered spontaneously, intact.  Cord: 3-vessel with the following complications: none.  Cord pH: N/A  Anesthesia:  Epidural Episiotomy: None Lacerations: Bilateral labial hemostatic, 1st-degree perineal hemostatic Suture Repair: None Est. Blood Loss (mL): 36  Mom to postpartum.  Baby to Couplet care / Skin to Skin.  The patient was noted to be complete and pushing. Patient noted to have epidural anesthesia.   The patient was noted to be contracting every minute and FHT showed early decels with quick return to appropriate baseline heart rate. The patient was asked to push and the head delivered spontaneously in the OA position, over an intact perineum. A nuchal cord was checked and none was noted.  The anterior shoulder delivered easily and the posterior shoulder followed. The remainder of the infant was easily delivered and placed on mom's chest skin-to-skin passed to the warmer where nursing personnel were in attendance. The infant was noted to have spontaneous cry and movement of all 4 extremities. The oropharynx and nasopharynx were bulb suctioned. After a 1-minute delay the cord was clamped x 2 and cut by father of the baby.   Cord blood was obtained.   The placenta delivered intact spontaneously. Pitocin was started IV to firm the uterus.   Examination of the cervix did not reveal any lacerations. Upon inspection, bilateral labial lacerations and a 1st degree perineal laceration were all found to be hemostatic.   All sponge and needle counts were correct. Derrill Memo CNM was present for the entire procedure.

## 2018-02-02 ENCOUNTER — Ambulatory Visit (INDEPENDENT_AMBULATORY_CARE_PROVIDER_SITE_OTHER): Payer: BLUE CROSS/BLUE SHIELD | Admitting: Obstetrics & Gynecology

## 2018-02-02 VITALS — BP 122/84 | HR 77 | Wt 173.0 lb

## 2018-02-02 DIAGNOSIS — O09293 Supervision of pregnancy with other poor reproductive or obstetric history, third trimester: Secondary | ICD-10-CM

## 2018-02-02 DIAGNOSIS — O99321 Drug use complicating pregnancy, first trimester: Secondary | ICD-10-CM

## 2018-02-02 DIAGNOSIS — F1210 Cannabis abuse, uncomplicated: Secondary | ICD-10-CM

## 2018-02-02 DIAGNOSIS — F419 Anxiety disorder, unspecified: Secondary | ICD-10-CM

## 2018-02-02 DIAGNOSIS — R82998 Other abnormal findings in urine: Secondary | ICD-10-CM

## 2018-02-02 DIAGNOSIS — E041 Nontoxic single thyroid nodule: Secondary | ICD-10-CM

## 2018-02-02 DIAGNOSIS — Z3480 Encounter for supervision of other normal pregnancy, unspecified trimester: Secondary | ICD-10-CM

## 2018-02-02 DIAGNOSIS — O09299 Supervision of pregnancy with other poor reproductive or obstetric history, unspecified trimester: Secondary | ICD-10-CM

## 2018-02-02 LAB — POCT URINALYSIS DIPSTICK OB
Blood, UA: NEGATIVE
Glucose, UA: NEGATIVE
Ketones, UA: POSITIVE
Leukocytes, UA: NEGATIVE
POC,PROTEIN,UA: NEGATIVE
Spec Grav, UA: 1.015 (ref 1.010–1.025)
pH, UA: 7 (ref 5.0–8.0)

## 2018-02-02 NOTE — Progress Notes (Signed)
   PRENATAL VISIT NOTE  Subjective:  Megan Webb is a 23 y.o. G2P1001 at [redacted]w[redacted]d being seen today for ongoing prenatal care.  She is currently monitored for the following issues for this high-risk pregnancy and has History of suicide attempt; Recurrent major depression-severe ([place]); Anxiety; Cannabis abuse; Supervision of other normal pregnancy, antepartum; H/O pre-eclampsia in prior pregnancy, currently pregnant; Thyroid nodule; Recurrent vaginitis complicating current pregnancy; and Umbilical hernia on their problem list.  Patient reports no complaints.  Contractions: Irritability. Vag. Bleeding: None.  Movement: Present. Denies leaking of fluid.   The following portions of the patient's history were reviewed and updated as appropriate: allergies, current medications, past family history, past medical history, past social history, past surgical history and problem list. Problem list updated.  Objective:   Vitals:   02/02/18 1551  BP: 122/84  Pulse: 77  Weight: 173 lb (78.5 kg)    Fetal Status: Fetal Heart Rate (bpm): 144   Movement: Present     General:  Alert, oriented and cooperative. Patient is in no acute distress.  Skin: Skin is warm and dry. No rash noted.   Cardiovascular: Normal heart rate noted  Respiratory: Normal respiratory effort, no problems with respiration noted  Abdomen: Soft, gravid, appropriate for gestational age.  Pain/Pressure: Present     Pelvic: Cervical exam deferred        Extremities: Normal range of motion.  Edema: None  Mental Status: Normal mood and affect. Normal behavior. Normal judgment and thought content.   Assessment and Plan:  Pregnancy: G2P1001 at [redacted]w[redacted]d  1. Supervision of other normal pregnancy, antepartum Mild poly. Has [place] f/u in 2 weeks 02/14/2018 If cont'd poly- rec del at 39 weeks  2. H/O pre-eclampsia in prior pregnancy, currently pregnant On baby ASA  3. Cannabis abuse No use since onset of pregnancy   4.  Anxiety stable  Preterm labor symptoms and general obstetric precautions including but not limited to vaginal bleeding, contractions, leaking of fluid and fetal movement were reviewed in detail with the patient. Please refer to After Visit Summary for other counseling recommendations.  Return in about 2 weeks (around 02/16/2018).  Future Appointments  Date Time Provider [place]  02/14/2018  9:30 AM [facility]-MFC [place] 5 WH-MFCUS MFC-US    Lavonia Drafts, MD

## 2018-02-02 NOTE — Addendum Note (Signed)
Addended by: Wendelyn Breslow L on: 02/02/2018 04:40 PM   Modules accepted: Orders

## 2018-02-14 ENCOUNTER — Ambulatory Visit (HOSPITAL_COMMUNITY)
Admission: RE | Admit: 2018-02-14 | Discharge: 2018-02-14 | Disposition: A | Discharge status: Home / Self Care | Payer: BLUE CROSS/BLUE SHIELD | Source: Ambulatory Visit | Attending: Family Medicine | Admitting: Family Medicine

## 2018-02-14 ENCOUNTER — Ambulatory Visit (INDEPENDENT_AMBULATORY_CARE_PROVIDER_SITE_OTHER): Payer: BLUE CROSS/BLUE SHIELD | Admitting: Advanced Practice Midwife

## 2018-02-14 ENCOUNTER — Encounter (HOSPITAL_COMMUNITY): Payer: Self-pay

## 2018-02-14 ENCOUNTER — Other Ambulatory Visit: Payer: Self-pay | Admitting: Advanced Practice Midwife

## 2018-02-14 ENCOUNTER — Encounter: Payer: Self-pay | Admitting: Advanced Practice Midwife

## 2018-02-14 ENCOUNTER — Other Ambulatory Visit (HOSPITAL_COMMUNITY)
Admission: RE | Admit: 2018-02-14 | Discharge: 2018-02-14 | Disposition: A | Discharge status: Home / Self Care | Payer: BLUE CROSS/BLUE SHIELD | Source: Ambulatory Visit | Attending: Advanced Practice Midwife | Admitting: Advanced Practice Midwife

## 2018-02-14 VITALS — BP 123/69 | HR 101 | Wt 174.0 lb

## 2018-02-14 DIAGNOSIS — O4443 Low lying placenta NOS or without hemorrhage, third trimester: Secondary | ICD-10-CM | POA: Diagnosis not present

## 2018-02-14 DIAGNOSIS — N898 Other specified noninflammatory disorders of vagina: Secondary | ICD-10-CM

## 2018-02-14 DIAGNOSIS — O409XX0 Polyhydramnios, unspecified trimester, not applicable or unspecified: Secondary | ICD-10-CM | POA: Diagnosis not present

## 2018-02-14 DIAGNOSIS — O403XX0 Polyhydramnios, third trimester, not applicable or unspecified: Secondary | ICD-10-CM

## 2018-02-14 DIAGNOSIS — Z3A36 36 weeks gestation of pregnancy: Secondary | ICD-10-CM

## 2018-02-14 DIAGNOSIS — Z3480 Encounter for supervision of other normal pregnancy, unspecified trimester: Secondary | ICD-10-CM

## 2018-02-14 DIAGNOSIS — Z3483 Encounter for supervision of other normal pregnancy, third trimester: Secondary | ICD-10-CM

## 2018-02-14 LAB — OB RESULTS CONSOLE GBS: GBS: NEGATIVE

## 2018-02-14 MED FILL — FLUCONAZOLE 150 MG TABS: 150 | 1 days supply | Qty: 1 | Fill #1

## 2018-02-14 NOTE — Patient Instructions (Signed)

## 2018-02-14 NOTE — Progress Notes (Signed)
   PRENATAL VISIT NOTE  Subjective:  Megan Webb is a 23 y.o. G2P1001 at [redacted]w[redacted]d being seen today for ongoing prenatal care.  She is currently monitored for the following issues for this high-risk pregnancy and has History of suicide attempt; Recurrent major depression-severe ([place]); Anxiety; Cannabis abuse; Supervision of other normal pregnancy, antepartum; H/O pre-eclampsia in prior pregnancy, currently pregnant; Thyroid nodule; Recurrent vaginitis complicating current pregnancy; and Umbilical hernia on their problem list.  Patient reports occasional contractions and vaginal itching with clumpy discharge  Contractions: Irregular. Vag. Bleeding: None.  Movement: Present. Denies leaking of fluid.   The following portions of the patient's history were reviewed and updated as appropriate: allergies, current medications, past family history, past medical history, past social history, past surgical history and problem list. Problem list updated.  Objective:   Vitals:   02/14/18 0810  BP: 123/69  Pulse: (!) 101  Weight: 78.9 kg    Fetal Status: Fetal Heart Rate (bpm): 160   Movement: Present     General:  Alert, oriented and cooperative. Patient is in no acute distress.  Skin: Skin is warm and dry. No rash noted.   Cardiovascular: Normal heart rate noted  Respiratory: Normal respiratory effort, no problems with respiration noted  Abdomen: Soft, gravid, appropriate for gestational age.  Pain/Pressure: Present     Pelvic: Cervical exam performed         Cervix closed/30%/soft/Ballotable  Extremities: Normal range of motion.  Edema: Trace  Mental Status: Normal mood and affect. Normal behavior. Normal judgment and thought content.   Assessment and Plan:  Pregnancy: G2P1001 at [redacted]w[redacted]d  1. Supervision of other normal pregnancy, antepartum      Reviewed signs of labor  - GC/Chlamydia probe amp ([place])not at [facility] - Culture, beta strep (group b only)  Mild poly. Has [place] f/u  today   If cont'd poly- plan del at 39 weeks  2. H/O pre-eclampsia in prior pregnancy, currently pregnant On baby ASA  3. Cannabis abuse No use since onset of pregnancy   4. Anxiety Stable  5.  Testing sent for yeast      Pt will refill Rx Diflucan, has boric acid  Preterm labor symptoms and general obstetric precautions including but not limited to vaginal bleeding, contractions, leaking of fluid and fetal movement were reviewed in detail with the patient. Please refer to After Visit Summary for other counseling recommendations.   RTO 1 week  Future Appointments  Date Time Provider [place]  02/14/2018  8:30 AM Seabron Spates, CNM [facility] None  02/14/2018  9:30 AM [place] 5 WH-MFCUS MFC-US    Hansel Feinstein, CNM

## 2018-02-15 LAB — CERVICOVAGINAL ANCILLARY ONLY
Candida vaginitis: NEGATIVE
Chlamydia: NEGATIVE
Neisseria Gonorrhea: NEGATIVE

## 2018-02-17 LAB — CULTURE, BETA STREP (GROUP B ONLY): Strep Gp B Culture: NEGATIVE

## 2018-02-24 ENCOUNTER — Ambulatory Visit (INDEPENDENT_AMBULATORY_CARE_PROVIDER_SITE_OTHER): Payer: Medicaid Other | Admitting: Family Medicine

## 2018-02-24 VITALS — BP 119/72 | HR 89 | Wt 177.0 lb

## 2018-02-24 DIAGNOSIS — O09293 Supervision of pregnancy with other poor reproductive or obstetric history, third trimester: Secondary | ICD-10-CM

## 2018-02-24 DIAGNOSIS — Z3480 Encounter for supervision of other normal pregnancy, unspecified trimester: Secondary | ICD-10-CM

## 2018-02-24 DIAGNOSIS — O09299 Supervision of pregnancy with other poor reproductive or obstetric history, unspecified trimester: Secondary | ICD-10-CM

## 2018-02-24 DIAGNOSIS — Z3483 Encounter for supervision of other normal pregnancy, third trimester: Secondary | ICD-10-CM

## 2018-02-24 NOTE — Progress Notes (Signed)
   PRENATAL VISIT NOTE  Subjective:  Megan Webb is a 23 y.o. G2P1001 at [redacted]w[redacted]d being seen today for ongoing prenatal care.  She is currently monitored for the following issues for this low-risk pregnancy and has History of suicide attempt; Recurrent major depression-severe ([place]); Anxiety; Cannabis abuse; Supervision of other normal pregnancy, antepartum; H/O pre-eclampsia in prior pregnancy, currently pregnant; Thyroid nodule; Recurrent vaginitis complicating current pregnancy; and Umbilical hernia on their problem list.  Patient reports no complaints.  Contractions: Irritability. Vag. Bleeding: None.  Movement: Present. Denies leaking of fluid.   The following portions of the patient's history were reviewed and updated as appropriate: allergies, current medications, past family history, past medical history, past social history, past surgical history and problem list. Problem list updated.  Objective:   Vitals:   02/24/18 0918  BP: 119/72  Pulse: 89  Weight: 177 lb (80.3 kg)    Fetal Status: Fetal Heart Rate (bpm): 138   Movement: Present     General:  Alert, oriented and cooperative. Patient is in no acute distress.  Skin: Skin is warm and dry. No rash noted.   Cardiovascular: Normal heart rate noted  Respiratory: Normal respiratory effort, no problems with respiration noted  Abdomen: Soft, gravid, appropriate for gestational age.  Pain/Pressure: Present     Pelvic: Cervical exam deferred        Extremities: Normal range of motion.  Edema: Trace  Mental Status: Normal mood and affect. Normal behavior. Normal judgment and thought content.   Assessment and Plan:  Pregnancy: G2P1001 at [redacted]w[redacted]d  1. Supervision of other normal pregnancy, antepartum FHT and FH normal  2. H/O pre-eclampsia in prior pregnancy, currently pregnant Continue ASA BP normal  Term labor symptoms and general obstetric precautions including but not limited to vaginal bleeding, contractions,  leaking of fluid and fetal movement were reviewed in detail with the patient. Please refer to After Visit Summary for other counseling recommendations.  Return in about 1 week (around 03/03/2018).  No future appointments.  Truett Mainland, DO

## 2018-03-03 ENCOUNTER — Ambulatory Visit (INDEPENDENT_AMBULATORY_CARE_PROVIDER_SITE_OTHER): Payer: BLUE CROSS/BLUE SHIELD | Admitting: Obstetrics & Gynecology

## 2018-03-03 ENCOUNTER — Encounter: Payer: Self-pay | Admitting: Obstetrics & Gynecology

## 2018-03-03 VITALS — BP 120/74 | HR 91 | Wt 179.0 lb

## 2018-03-03 DIAGNOSIS — F332 Major depressive disorder, recurrent severe without psychotic features: Secondary | ICD-10-CM

## 2018-03-03 DIAGNOSIS — O09299 Supervision of pregnancy with other poor reproductive or obstetric history, unspecified trimester: Secondary | ICD-10-CM

## 2018-03-03 DIAGNOSIS — Z3483 Encounter for supervision of other normal pregnancy, third trimester: Secondary | ICD-10-CM

## 2018-03-03 DIAGNOSIS — Z3480 Encounter for supervision of other normal pregnancy, unspecified trimester: Secondary | ICD-10-CM

## 2018-03-03 DIAGNOSIS — O09293 Supervision of pregnancy with other poor reproductive or obstetric history, third trimester: Secondary | ICD-10-CM

## 2018-03-03 DIAGNOSIS — F419 Anxiety disorder, unspecified: Secondary | ICD-10-CM

## 2018-03-03 NOTE — Progress Notes (Signed)
   PRENATAL VISIT NOTE  Subjective:  Megan Webb is a 23 y.o. G2P1001 at [redacted]w[redacted]d being seen today for ongoing prenatal care.  She is currently monitored for the following issues for this low-risk pregnancy and has History of suicide attempt; Recurrent major depression-severe ([place]); Anxiety; Cannabis abuse; Supervision of other normal pregnancy, antepartum; H/O pre-eclampsia in prior pregnancy, currently pregnant; Thyroid nodule; Recurrent vaginitis complicating current pregnancy; and Umbilical hernia on their problem list.  Patient reports no complaints.  Contractions: Irritability. Vag. Bleeding: None.  Movement: Present. Denies leaking of fluid.   The following portions of the patient's history were reviewed and updated as appropriate: allergies, current medications, past family history, past medical history, past social history, past surgical history and problem list. Problem list updated.  Objective:   Vitals:   03/03/18 0818  BP: 120/74  Pulse: 91  Weight: 179 lb (81.2 kg)    Fetal Status:     Movement: Present     General:  Alert, oriented and cooperative. Patient is in no acute distress.  Skin: Skin is warm and dry. No rash noted.   Cardiovascular: Normal heart rate noted  Respiratory: Normal respiratory effort, no problems with respiration noted  Abdomen: Soft, gravid, appropriate for gestational age.  Pain/Pressure: Present     Pelvic: Cervical exam performed        Extremities: Normal range of motion.  Edema: Trace  Mental Status: Normal mood and affect. Normal behavior. Normal judgment and thought content.   Assessment and Plan:  Pregnancy: G2P1001 at [redacted]w[redacted]d  1. Supervision of other normal pregnancy, antepartum S>D last [place] had EFW  At 82nd %ile Will get a f/u US for growth schedule IOL at 41 weeks   2. 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 H/O pre-eclampsia in prior  pregnancy, currently pregnant On baby ASA  4. Anxiety  Term labor symptoms and general obstetric precautions including but not limited to vaginal bleeding, contractions, leaking of fluid and fetal movement were reviewed in detail with the patient. Please refer to After Visit Summary for other counseling recommendations.  Return in about 1 week (around 03/10/2018).  No future appointments.  Lavonia Drafts, MD

## 2018-03-03 NOTE — Patient Instructions (Signed)

## 2018-03-06 ENCOUNTER — Telehealth (HOSPITAL_COMMUNITY): Payer: Self-pay | Admitting: *Deleted

## 2018-03-06 ENCOUNTER — Other Ambulatory Visit: Payer: Self-pay | Admitting: Family Medicine

## 2018-03-06 NOTE — Telephone Encounter (Signed)
Preadmission screen  

## 2018-03-08 ENCOUNTER — Encounter (HOSPITAL_COMMUNITY): Payer: Self-pay

## 2018-03-08 ENCOUNTER — Ambulatory Visit (HOSPITAL_COMMUNITY)
Admission: RE | Admit: 2018-03-08 | Discharge: 2018-03-08 | Disposition: A | Discharge status: Home / Self Care | Payer: BLUE CROSS/BLUE SHIELD | Source: Ambulatory Visit | Attending: Obstetrics & Gynecology | Admitting: Obstetrics & Gynecology

## 2018-03-08 DIAGNOSIS — E041 Nontoxic single thyroid nodule: Secondary | ICD-10-CM | POA: Insufficient documentation

## 2018-03-08 DIAGNOSIS — O26843 Uterine size-date discrepancy, third trimester: Secondary | ICD-10-CM

## 2018-03-08 DIAGNOSIS — Z3A39 39 weeks gestation of pregnancy: Secondary | ICD-10-CM

## 2018-03-08 DIAGNOSIS — O09299 Supervision of pregnancy with other poor reproductive or obstetric history, unspecified trimester: Secondary | ICD-10-CM | POA: Insufficient documentation

## 2018-03-08 DIAGNOSIS — Z362 Encounter for other antenatal screening follow-up: Secondary | ICD-10-CM

## 2018-03-08 DIAGNOSIS — Z3480 Encounter for supervision of other normal pregnancy, unspecified trimester: Secondary | ICD-10-CM | POA: Diagnosis not present

## 2018-03-09 ENCOUNTER — Ambulatory Visit (INDEPENDENT_AMBULATORY_CARE_PROVIDER_SITE_OTHER): Payer: BLUE CROSS/BLUE SHIELD | Admitting: Obstetrics & Gynecology

## 2018-03-09 VITALS — BP 108/72 | HR 106 | Wt 180.0 lb

## 2018-03-09 DIAGNOSIS — Z3483 Encounter for supervision of other normal pregnancy, third trimester: Secondary | ICD-10-CM

## 2018-03-09 DIAGNOSIS — Z3480 Encounter for supervision of other normal pregnancy, unspecified trimester: Secondary | ICD-10-CM

## 2018-03-09 DIAGNOSIS — Z3A39 39 weeks gestation of pregnancy: Secondary | ICD-10-CM

## 2018-03-09 DIAGNOSIS — O09293 Supervision of pregnancy with other poor reproductive or obstetric history, third trimester: Secondary | ICD-10-CM

## 2018-03-09 DIAGNOSIS — O09299 Supervision of pregnancy with other poor reproductive or obstetric history, unspecified trimester: Secondary | ICD-10-CM

## 2018-03-09 NOTE — Progress Notes (Signed)
   PRENATAL VISIT NOTE  Subjective:  Megan Webb is a 23 y.o. G2P1001 at [redacted]w[redacted]d being seen today for ongoing prenatal care.  She is currently monitored for the following issues for this high-risk pregnancy and has History of suicide attempt; Recurrent major depression-severe ([place]); Anxiety; Cannabis abuse; Supervision of other normal pregnancy, antepartum; H/O pre-eclampsia in prior pregnancy, currently pregnant; Thyroid nodule; Recurrent vaginitis complicating current pregnancy; and Umbilical hernia on their problem list.  Patient reports no complaints.  Contractions: Irregular. Vag. Bleeding: None.  Movement: Present. Denies leaking of fluid.   The following portions of the patient's history were reviewed and updated as appropriate: allergies, current medications, past family history, past medical history, past social history, past surgical history and problem list. Problem list updated.  Objective:   Vitals:   03/09/18 0908  BP: 108/72  Pulse: (!) 106  Weight: 180 lb (81.6 kg)    Fetal Status: Fetal Heart Rate (bpm): 161   Movement: Present     General:  Alert, oriented and cooperative. Patient is in no acute distress.  Skin: Skin is warm and dry. No rash noted.   Cardiovascular: Normal heart rate noted  Respiratory: Normal respiratory effort, no problems with respiration noted  Abdomen: Soft, gravid, appropriate for gestational age.  Pain/Pressure: Absent     Pelvic: Cervical exam performed        Extremities: Normal range of motion.  Edema: Trace  Mental Status: Normal mood and affect. Normal behavior. Normal judgment and thought content.   Assessment and Plan:  Pregnancy: G2P1001 at [redacted]w[redacted]d  1. H/O pre-eclampsia in prior pregnancy, currently pregnant - IOL tomorrow  2. Supervision of other normal pregnancy, antepartum   Term labor symptoms and general obstetric precautions including but not limited to vaginal bleeding, contractions, leaking of fluid and fetal  movement were reviewed in detail with the patient. Please refer to After Visit Summary for other counseling recommendations.  No follow-ups on file.  Future Appointments  Date Time Provider [place]  03/10/2018  7:30 AM WH-BSSCHED ROOM [facility]-BSSCHED None    Emily Filbert, MD

## 2018-03-10 ENCOUNTER — Encounter (HOSPITAL_COMMUNITY): Payer: Self-pay

## 2018-03-10 ENCOUNTER — Inpatient Hospital Stay (HOSPITAL_COMMUNITY)
Admission: RE | Admit: 2018-03-10 | Discharge: 2018-03-12 | DRG: 806 | Disposition: A | Discharge status: Home / Self Care | Payer: BLUE CROSS/BLUE SHIELD | Source: Ambulatory Visit | Attending: Obstetrics & Gynecology | Admitting: Obstetrics & Gynecology

## 2018-03-10 ENCOUNTER — Other Ambulatory Visit: Payer: Self-pay

## 2018-03-10 ENCOUNTER — Inpatient Hospital Stay (HOSPITAL_COMMUNITY): Payer: BLUE CROSS/BLUE SHIELD | Admitting: Anesthesiology

## 2018-03-10 VITALS — BP 116/75 | HR 70 | Temp 97.7°F | Resp 16 | Ht 63.0 in | Wt 180.2 lb

## 2018-03-10 DIAGNOSIS — O9902 Anemia complicating childbirth: Secondary | ICD-10-CM | POA: Diagnosis present

## 2018-03-10 DIAGNOSIS — O09299 Supervision of pregnancy with other poor reproductive or obstetric history, unspecified trimester: Secondary | ICD-10-CM

## 2018-03-10 DIAGNOSIS — Z3A40 40 weeks gestation of pregnancy: Secondary | ICD-10-CM | POA: Diagnosis not present

## 2018-03-10 DIAGNOSIS — Z9189 Other specified personal risk factors, not elsewhere classified: Secondary | ICD-10-CM

## 2018-03-10 DIAGNOSIS — F1290 Cannabis use, unspecified, uncomplicated: Secondary | ICD-10-CM | POA: Diagnosis not present

## 2018-03-10 DIAGNOSIS — N760 Acute vaginitis: Secondary | ICD-10-CM | POA: Diagnosis present

## 2018-03-10 DIAGNOSIS — F419 Anxiety disorder, unspecified: Secondary | ICD-10-CM | POA: Diagnosis present

## 2018-03-10 DIAGNOSIS — O9962 Diseases of the digestive system complicating childbirth: Secondary | ICD-10-CM | POA: Diagnosis present

## 2018-03-10 DIAGNOSIS — Z915 Personal history of self-harm: Secondary | ICD-10-CM

## 2018-03-10 DIAGNOSIS — K429 Umbilical hernia without obstruction or gangrene: Secondary | ICD-10-CM | POA: Diagnosis not present

## 2018-03-10 DIAGNOSIS — D649 Anemia, unspecified: Secondary | ICD-10-CM

## 2018-03-10 DIAGNOSIS — O480 Post-term pregnancy: Secondary | ICD-10-CM | POA: Diagnosis not present

## 2018-03-10 DIAGNOSIS — O99324 Drug use complicating childbirth: Secondary | ICD-10-CM | POA: Diagnosis present

## 2018-03-10 DIAGNOSIS — Z9151 Personal history of suicidal behavior: Secondary | ICD-10-CM

## 2018-03-10 DIAGNOSIS — O23599 Infection of other part of genital tract in pregnancy, unspecified trimester: Secondary | ICD-10-CM

## 2018-03-10 HISTORY — DX: Anemia, unspecified: D64.9

## 2018-03-10 HISTORY — DX: Depression, unspecified: F32.A

## 2018-03-10 HISTORY — DX: Umbilical hernia without obstruction or gangrene: K42.9

## 2018-03-10 HISTORY — DX: Anxiety disorder, unspecified: F41.9

## 2018-03-10 HISTORY — DX: Major depressive disorder, single episode, unspecified: F32.9

## 2018-03-10 HISTORY — DX: Thyrotoxicosis, unspecified without thyrotoxic crisis or storm: E05.90

## 2018-03-10 LAB — CBC
HCT: 36.5 % (ref 36.0–46.0)
HCT: 34.2 % — ABNORMAL LOW (ref 36.0–46.0)
Hemoglobin: 11.9 g/dL — ABNORMAL LOW (ref 12.0–15.0)
Hemoglobin: 11.1 g/dL — ABNORMAL LOW (ref 12.0–15.0)
MCH: 29.1 pg (ref 26.0–34.0)
MCH: 28.8 pg (ref 26.0–34.0)
MCHC: 32.6 g/dL (ref 30.0–36.0)
MCHC: 32.5 g/dL (ref 30.0–36.0)
MCV: 89.2 fL (ref 80.0–100.0)
MCV: 88.8 fL (ref 80.0–100.0)
Platelets: 127 10*3/uL — ABNORMAL LOW (ref 150–400)
Platelets: 125 10*3/uL — ABNORMAL LOW (ref 150–400)
RBC: 4.09 MIL/uL (ref 3.87–5.11)
RBC: 3.85 MIL/uL — ABNORMAL LOW (ref 3.87–5.11)
RDW: 16.1 % — ABNORMAL HIGH (ref 11.5–15.5)
RDW: 16.2 % — ABNORMAL HIGH (ref 11.5–15.5)
WBC: 6.9 10*3/uL (ref 4.0–10.5)
WBC: 10.6 10*3/uL — ABNORMAL HIGH (ref 4.0–10.5)
nRBC: 0 % (ref 0.0–0.2)
nRBC: 0 % (ref 0.0–0.2)

## 2018-03-10 LAB — TYPE AND SCREEN
ABO/RH(D): A POS
Antibody Screen: NEGATIVE

## 2018-03-10 LAB — RPR: RPR Ser Ql: NONREACTIVE

## 2018-03-10 MED ORDER — DIBUCAINE 1 % RE OINT
1.0000 "application " | TOPICAL_OINTMENT | RECTAL | Status: DC | PRN
  Filled 2018-03-10: qty 28

## 2018-03-10 MED ORDER — LIDOCAINE HCL (PF) 1 % IJ SOLN
30.0000 mL | INTRAMUSCULAR | Status: DC | PRN
  Filled 2018-03-10: qty 30

## 2018-03-10 MED ORDER — TERBUTALINE SULFATE 1 MG/ML IJ SOLN
0.2500 mg | Freq: Once | INTRAMUSCULAR | Status: DC | PRN
  Filled 2018-03-10: qty 1

## 2018-03-10 MED ORDER — WITCH HAZEL-GLYCERIN EX PADS: 1.0000 "application " | MEDICATED_PAD | CUTANEOUS | Status: DC | PRN

## 2018-03-10 MED ORDER — ONDANSETRON HCL 4 MG/2ML IJ SOLN: 4.0000 mg | INTRAMUSCULAR | Status: DC | PRN

## 2018-03-10 MED ORDER — PHENYLEPHRINE 40 MCG/ML (10ML) SYRINGE FOR IV PUSH (FOR BLOOD PRESSURE SUPPORT)
80.0000 ug | PREFILLED_SYRINGE | INTRAVENOUS | Status: DC | PRN
  Filled 2018-03-10 (×2): qty 10

## 2018-03-10 MED ORDER — LACTATED RINGERS IV SOLN
500.0000 mL | INTRAVENOUS | Status: DC | PRN
  Administered 2018-03-10: 1000 mL via INTRAVENOUS

## 2018-03-10 MED ORDER — OXYTOCIN 40 UNITS IN NORMAL SALINE INFUSION - SIMPLE MED
2.5000 [IU]/h | INTRAVENOUS | Status: DC
  Administered 2018-03-10: 2.5 [IU]/h via INTRAVENOUS

## 2018-03-10 MED ORDER — EPHEDRINE 5 MG/ML INJ
10.0000 mg | INTRAVENOUS | Status: DC | PRN
  Filled 2018-03-10: qty 2

## 2018-03-10 MED ORDER — ACETAMINOPHEN 325 MG PO TABS
650.0000 mg | ORAL_TABLET | ORAL | Status: DC | PRN
  Administered 2018-03-11 (×2): 650 mg via ORAL
  Filled 2018-03-10 (×2): qty 2

## 2018-03-10 MED ORDER — LACTATED RINGERS IV SOLN
500.0000 mL | Freq: Once | INTRAVENOUS | Status: AC
  Administered 2018-03-10: 1000 mL via INTRAVENOUS

## 2018-03-10 MED ORDER — SIMETHICONE 80 MG PO CHEW: 80.0000 mg | CHEWABLE_TABLET | ORAL | Status: DC | PRN

## 2018-03-10 MED ORDER — PRENATAL MULTIVITAMIN CH
1.0000 | ORAL_TABLET | Freq: Every day | ORAL | Status: DC
  Administered 2018-03-11: 1 via ORAL
  Filled 2018-03-10: qty 1

## 2018-03-10 MED ORDER — LACTATED RINGERS IV SOLN
INTRAVENOUS | Status: DC
  Administered 2018-03-10 (×2): via INTRAVENOUS

## 2018-03-10 MED ORDER — TETANUS-DIPHTH-ACELL PERTUSSIS 5-2.5-18.5 LF-MCG/0.5 IM SUSP: 0.5000 mL | Freq: Once | INTRAMUSCULAR | Status: DC

## 2018-03-10 MED ORDER — SENNOSIDES-DOCUSATE SODIUM 8.6-50 MG PO TABS
2.0000 | ORAL_TABLET | ORAL | Status: DC
  Administered 2018-03-11 (×2): 2 via ORAL
  Filled 2018-03-10 (×3): qty 2

## 2018-03-10 MED ORDER — LIDOCAINE HCL (PF) 1 % IJ SOLN
INTRAMUSCULAR | Status: DC | PRN
  Administered 2018-03-10: 13 mL via EPIDURAL

## 2018-03-10 MED ORDER — PHENYLEPHRINE 40 MCG/ML (10ML) SYRINGE FOR IV PUSH (FOR BLOOD PRESSURE SUPPORT)
80.0000 ug | PREFILLED_SYRINGE | INTRAVENOUS | Status: DC | PRN
  Filled 2018-03-10: qty 10

## 2018-03-10 MED ORDER — FENTANYL 2.5 MCG/ML BUPIVACAINE 1/10 % EPIDURAL INFUSION (WH - ANES)
14.0000 mL/h | INTRAMUSCULAR | Status: DC | PRN
  Administered 2018-03-10: 14 mL/h via EPIDURAL
  Filled 2018-03-10: qty 100

## 2018-03-10 MED ORDER — MISOPROSTOL 25 MCG QUARTER TABLET
25.0000 ug | ORAL_TABLET | ORAL | Status: DC | PRN
  Filled 2018-03-10: qty 1

## 2018-03-10 MED ORDER — OXYTOCIN 40 UNITS IN NORMAL SALINE INFUSION - SIMPLE MED
1.0000 m[IU]/min | INTRAVENOUS | Status: DC
  Administered 2018-03-10: 2 m[IU]/min via INTRAVENOUS
  Filled 2018-03-10: qty 1000

## 2018-03-10 MED ORDER — BENZOCAINE-MENTHOL 20-0.5 % EX AERO
1.0000 "application " | INHALATION_SPRAY | CUTANEOUS | Status: DC | PRN
  Administered 2018-03-10: 1 via TOPICAL
  Filled 2018-03-10: qty 56

## 2018-03-10 MED ORDER — OXYTOCIN BOLUS FROM INFUSION
500.0000 mL | Freq: Once | INTRAVENOUS | Status: AC
  Administered 2018-03-10: 500 mL via INTRAVENOUS

## 2018-03-10 MED ORDER — OXYCODONE HCL 5 MG PO TABS: 5.0000 mg | ORAL_TABLET | ORAL | Status: DC | PRN

## 2018-03-10 MED ORDER — ONDANSETRON HCL 4 MG PO TABS: 4.0000 mg | ORAL_TABLET | ORAL | Status: DC | PRN

## 2018-03-10 MED ORDER — DIPHENHYDRAMINE HCL 25 MG PO CAPS: 25.0000 mg | ORAL_CAPSULE | Freq: Four times a day (QID) | ORAL | Status: DC | PRN

## 2018-03-10 MED ORDER — SOD CITRATE-CITRIC ACID 500-334 MG/5ML PO SOLN: 30.0000 mL | ORAL | Status: DC | PRN

## 2018-03-10 MED ORDER — FENTANYL CITRATE (PF) 100 MCG/2ML IJ SOLN: 100.0000 ug | INTRAMUSCULAR | Status: DC | PRN

## 2018-03-10 MED ORDER — FLEET ENEMA 7-19 GM/118ML RE ENEM: 1.0000 | ENEMA | RECTAL | Status: DC | PRN

## 2018-03-10 MED ORDER — ONDANSETRON HCL 4 MG/2ML IJ SOLN: 4.0000 mg | Freq: Four times a day (QID) | INTRAMUSCULAR | Status: DC | PRN

## 2018-03-10 MED ORDER — COCONUT OIL OIL: 1.0000 "application " | TOPICAL_OIL | Status: DC | PRN

## 2018-03-10 MED ORDER — ZOLPIDEM TARTRATE 5 MG PO TABS
5.0000 mg | ORAL_TABLET | Freq: Every evening | ORAL | Status: DC | PRN
  Administered 2018-03-11: 5 mg via ORAL

## 2018-03-10 MED ORDER — DIPHENHYDRAMINE HCL 50 MG/ML IJ SOLN: 12.5000 mg | INTRAMUSCULAR | Status: DC | PRN

## 2018-03-10 MED ORDER — IBUPROFEN 600 MG PO TABS
600.0000 mg | ORAL_TABLET | Freq: Four times a day (QID) | ORAL | Status: DC
  Administered 2018-03-10 – 2018-03-12 (×7): 600 mg via ORAL
  Filled 2018-03-10 (×7): qty 1

## 2018-03-10 NOTE — Progress Notes (Signed)
Patient ID: Megan Webb, female   DOB: 10/09/1995, 23 y.o.   MRN: [id number]  Starting to get comfortable with recently placed epidural; Pit recently stopped due to tachysystole  BP 121/74, P 81 FHR 150s, some 10x10accels, mi variables Ctx q 1-2 mins (Pit was at 28mu/min) Cx 6/C/vtx -2, BBOW  IUP@term  Early active labor GBS neg  Will allow ctx to settle down a bit and pt to get more comfortable, then consider AROM Anticipate SVD  Myrtis Ser CNM 03/10/2018 1:15 PM

## 2018-03-10 NOTE — Anesthesia Pain Management Evaluation Note (Signed)
  CRNA Pain Management Visit Note  Patient: Megan Webb, 23 y.o., female  "Hello I am a member of the anesthesia team at [facility]. We have an anesthesia team available at all times to provide care throughout the hospital, including epidural management and anesthesia for C-section. I don't know your plan for the delivery whether it a natural birth, water birth, IV sedation, nitrous supplementation, doula or epidural, but we want to meet your pain goals."   1.Was your pain managed to your expectations on prior hospitalizations?   Yes   2.What is your expectation for pain management during this hospitalization?     Epidural - epidural placed approximately 10 minutes prior to consult - Pain originally 10, now a 7- instructed patient to ask for anesthesia if epidural fails to manage her pain in the next 10-15 minutes   3.How can we help you reach that goal? Support PRN  Record the patient's initial score and the patient's pain goal.   Pain: 7  Pain Goal: 4 [facility] wants you to be able to say your pain was always managed very well.  Kathie Rhodes 03/10/2018

## 2018-03-10 NOTE — Anesthesia Preprocedure Evaluation (Signed)
Anesthesia Evaluation  Patient identified by MRN, date of birth, ID band Patient awake    Reviewed: Allergy & Precautions, NPO status , Patient's Chart, lab work & pertinent test results  Airway Mallampati: I  TM Distance: >3 FB Neck ROM: Full    Dental  (+) Teeth Intact, Dental Advisory Given   Pulmonary    breath sounds clear to auscultation       Cardiovascular hypertension,  Rhythm:Regular Rate:Normal     Neuro/Psych    GI/Hepatic   Endo/Other    Renal/GU      Musculoskeletal   Abdominal   Peds  Hematology   Anesthesia Other Findings   Reproductive/Obstetrics (+) Pregnancy                             Anesthesia Physical  Anesthesia Plan  ASA: II  Anesthesia Plan: Epidural   Post-op Pain Management:    Induction:   PONV Risk Score and Plan:   Airway Management Planned: Natural Airway  Additional Equipment:   Intra-op Plan:   Post-operative Plan:   Informed Consent: I have reviewed the patients History and Physical, chart, labs and discussed the procedure including the risks, benefits and alternatives for the proposed anesthesia with the patient or authorized representative who has indicated his/her understanding and acceptance.       Plan Discussed with: CRNA, Anesthesiologist and Surgeon  Anesthesia Plan Comments:         Anesthesia Quick Evaluation

## 2018-03-10 NOTE — Discharge Summary (Signed)
OB Discharge Summary    Patient Name: Megan Webb DOB: Dec 18, 1995 MRN: [id number]  Date of admission: 03/10/2018 Delivering MD: Milus Banister C   Date of discharge: 03/12/2018  Admitting diagnosis: 92 WKS INDUCTION Intrauterine pregnancy: [redacted]w[redacted]d     Secondary diagnosis:  Active Problems:   History of suicide attempt   Anxiety   H/O pre-eclampsia in prior pregnancy, currently pregnant   Recurrent vaginitis complicating current pregnancy   Umbilical hernia   Post-dates pregnancy   Anemia  Additional problems: None     Discharge diagnosis: Term Pregnancy Delivered and Anemia                                                                                               Post partum procedures:None  Augmentation: AROM and Pitocin  Complications: None  Hospital course:  Induction of Labor With Vaginal Delivery: 23 y.o. yo G2P2002 at [redacted]w[redacted]d was admitted for IOL at term on 03/10/2018. With a small amount of Pitocin, pt progressed into active labor and on to delivery.   Membrane Rupture Time/Date: 2:42 PM ,03/10/2018   Intrapartum Procedures: Episiotomy: None [1]                                         Lacerations:  None [1]  Minor lacs to bilateral labia and 1st degree perineal - all hemostatic Patient had a delivery of a Viable infant. 03/10/2018  Information for the patient's newborn:  Gisselle Galvis, Girl Avantika [[id number]]  Delivery Method: Vaginal, Spontaneous(Filed from Delivery Summary)   Patient had an uncomplicated postpartum course.  She is ambulating, tolerating a regular diet, passing flatus, and urinating well. Patient is discharged home in stable condition on 03/12/18.  Physical exam  Vitals:   03/11/18 0536 03/11/18 1414 03/11/18 2025 03/12/18 0535  BP: 125/82 134/86 (!) 127/96 116/75  Pulse: 78 74 67 70  Resp: 16 18 16 16   Temp: 98.1 F (36.7 C) 98.2 F (36.8 C) 97.8 F (36.6 C) 97.7 F (36.5 C)  TempSrc: Oral Oral Oral Oral  SpO2: 99% 100%   99%  Weight:      Height:       General: alert, cooperative and no distress Lochia: appropriate Uterine Fundus: firm Incision: N/A DVT Evaluation: No evidence of DVT seen on physical exam. No significant calf/ankle edema. Labs: Lab Results  Component Value Date   WBC 9.6 03/11/2018   HGB 10.6 (L) 03/11/2018   HCT 32.4 (L) 03/11/2018   MCV 89.0 03/11/2018   PLT 129 (L) 03/11/2018   CMP Latest Ref Rng & Units 08/19/2017  Glucose 65 - 99 mg/dL 78  BUN 6 - 20 mg/dL 9  Creatinine 0.57 - 1.00 mg/dL 0.41(L)  Sodium 134 - 144 mmol/L 138  Potassium 3.5 - 5.2 mmol/L 4.0  Chloride 96 - 106 mmol/L 108(H)  CO2 20 - 29 mmol/L 17(L)  Calcium 8.7 - 10.2 mg/dL 9.4  Total Protein 6.0 - 8.5 g/dL 6.6  Total Bilirubin 0.0 - 1.2 mg/dL 0.4  Alkaline Phos 39 - 117 IU/L 42  AST 0 - 40 IU/L 16  ALT 0 - 32 IU/L 8    Discharge instruction: per After Visit Summary and "Baby and Me Booklet".  After visit meds:  Allergies as of 03/12/2018   No Known Allergies     Medication List    STOP taking these medications   aspirin EC 81 MG tablet   Boric Acid Crys     TAKE these medications   ferrous sulfate 325 (65 FE) MG tablet Commonly known as:  FERROUSUL Take 1 tablet (325 mg total) by mouth 2 (two) times daily.   ibuprofen 600 MG tablet Commonly known as:  ADVIL,MOTRIN Take 1 tablet (600 mg total) by mouth every 6 (six) hours.   PRENATAL VITAMINS PO Take by mouth.     Please schedule this patient for Postpartum visit in: 4 weeks with the following provider: Any provider For C/S patients schedule nurse incision check in weeks 2 weeks: no Low risk pregnancy complicated by: hx depression Delivery mode:  SVD Anticipated Birth Control:  Condoms PP Procedures needed: none  Schedule Integrated [place] visit: no   Diet: routine diet  Activity: Advance as tolerated. Pelvic rest for 6 weeks.   Outpatient follow up:4 weeks Follow up Appt: Future Appointments  Date Time Provider [place]  04/10/2018  8:30 AM Truett Mainland, DO [facility] None   Follow up Visit:No follow-ups on file.  Postpartum contraception: Condoms  Newborn Data: Live born female  Birth Weight:   APGAR: 66, 9  Newborn Delivery   Birth date/time:  03/10/2018 15:38:00 Delivery type:  Vaginal, Spontaneous     Baby Feeding: Breast Disposition:home with mother   03/12/2018 Aura Camps, MD

## 2018-03-10 NOTE — H&P (Addendum)
LABOR AND DELIVERY ADMISSION HISTORY AND PHYSICAL NOTE  Megan Webb is a 23 y.o. female G2P1001 with IUP at [redacted]w[redacted]d by 1st-trimester [place] presenting for IOL for PD.   She reports positive fetal movement. She denies leakage of fluid or vaginal bleeding.  Prenatal History/Complications: PNC at [facility] Pregnancy complications:  - Patient reports recurring yeast infection during pregnancy  Past Medical History: Past Medical History:  Diagnosis Date  . Anemia    on Iron supplements  . Anxiety    in the past; not current  . Depression    in the past; not current  . Headache    has migraines, takes Extra Strength Tylenol with minimal relief  . Hyperthyroidism    nodule on thyroid; but not diagnosed with hyperthyroidism  . Umbilical hernia [id number]   Past Surgical History: Past Surgical History:  Procedure Laterality Date  . BUNIONECTOMY     Obstetrical History: OB History    Gravida  2   Para  1   Term  1   Preterm  0   AB  0   Living  1     SAB  0   TAB  0   Ectopic  0   Multiple  0   Live Births  1          Social History: Social History   Socioeconomic History  . Marital status: Married    Spouse name: Not on file  . Number of children: 1  . Years of education: Not on file  . Highest education level: Not on file  Occupational History  . Not on file  Social Needs  . Financial resource strain: Not on file  . Food insecurity:    Worry: Not on file    Inability: Not on file  . Transportation needs:    Medical: Not on file    Non-medical: Not on file  Tobacco Use  . Smoking status: Never Smoker  . Smokeless tobacco: Never Used  Substance and Sexual Activity  . Alcohol use: No    Frequency: Never    Comment: occ  . Drug use: No    Frequency: 1.0 times per week    Types: Marijuana  . Sexual activity: Yes    Birth control/protection: None  Lifestyle  . Physical activity:    Days per week: Not on file    Minutes per session: Not on file   . Stress: Not on file  Relationships  . Social connections:    Talks on phone: Not on file    Gets together: Not on file    Attends religious service: Not on file    Active member of club or organization: Not on file    Attends meetings of clubs or organizations: Not on file    Relationship status: Not on file  Other Topics Concern  . Not on file  Social History Narrative  . Not on file   Family History: Family History  Problem Relation Age of Onset  . Mental illness Mother        anxiety and depression  . Hypertension Mother   . Kidney disease Mother   . Anemia Mother   . Cataracts Father   . Asthma Brother   . Diabetes Maternal Aunt   . Cancer Maternal Aunt        breast  . Diabetes Maternal Uncle   . Arthritis Maternal Grandmother   . Stroke Maternal Grandmother   . Hypertension Maternal Grandmother   .  Arthritis Maternal Grandfather   . Hypertension Maternal Grandfather   . Stroke Maternal Grandfather   . Arthritis Paternal Grandfather   . Stroke Paternal Grandfather    Allergies: No Known Allergies - confirmed  Medications Prior to Admission  Medication Sig Dispense Refill Last Dose  . aspirin EC 81 MG tablet Take 1 tablet (81 mg total) by mouth daily. Take after 12 weeks for prevention of preeclampsia later in pregnancy 300 tablet 2 03/09/2018 at Unknown time  . ferrous sulfate (FERROUSUL) 325 (65 FE) MG tablet Take 1 tablet (325 mg total) by mouth 2 (two) times daily. 60 tablet 1 03/09/2018 at Unknown time  . Prenatal Multivit-Min-Fe-FA (PRENATAL VITAMINS PO) Take by mouth.   03/09/2018 at Unknown time  . Boric Acid CR[place] Place 600 mg vaginally at bedtime. Use vaginally every night for two weeks then twice a week (Patient not taking: Reported on 03/08/2018) 500 g 5 Not Taking   Review of Systems  All systems reviewed and negative except as stated in HPI  Physical Exam Blood pressure (!) 120/92, pulse 95, resp. rate 18, height 5\' 3"  (1.6 m), weight 81.7 kg, last  menstrual period 05/13/2017, currently breastfeeding. General appearance: alert, oriented, NAD Lungs: normal respiratory effort Heart: regular rate Abdomen: soft, non-tender; gravid, FH appropriate for GA Extremities: No calf swelling or tenderness Presentation: cephalic Fetal monitoring: baseline 150, moderate variability, 10x10 accels, occasional decels Uterine activity: contractions q 2-3 minutes Dilation: 4 Effacement (%): 70 Station: -2 Exam by:: Curly [place], RN  Prenatal labs: ABO, Rh: --/--/A POS (02/14 0539) Antibody: NEG (02/14 0814) Rubella: 3.45 (07/11 1000) RPR: Non Reactive (11/13 0850)  HBsAg: Negative (07/11 1000)  HIV: Non Reactive (11/13 0850)  GC/Chlamydia: Negative/Negative GBS: Negative (01/21 0000)  2-hr GTT: 79/137/117 Genetic screening:  QUAD wnl Anatomy US: WNL, girl  Prenatal Transfer Tool  Maternal Diabetes: No Genetic Screening: Normal Maternal Ultrasounds/Referrals: Normal Fetal Ultrasounds or other Referrals:  None Maternal Substance Abuse:  No Significant Maternal Medications:  Meds include: Other: prenatal vitamin, iron supplement, ASA 81 daily for hx of PEC Significant Maternal Lab Results: Lab values include: Group B Strep negative  Results for orders placed or performed during the hospital encounter of 03/10/18 (from the past 24 hour(s))  CBC   Collection Time: 03/10/18  8:14 AM  Result Value Ref Range   WBC 6.9 4.0 - 10.5 K/uL   RBC 4.09 3.87 - 5.11 MIL/uL   Hemoglobin 11.9 (L) 12.0 - 15.0 g/dL   HCT 36.5 36.0 - 46.0 %   MCV 89.2 80.0 - 100.0 fL   MCH 29.1 26.0 - 34.0 pg   MCHC 32.6 30.0 - 36.0 g/dL   RDW 16.1 (H) 11.5 - 15.5 %   Platelets 127 (L) 150 - 400 K/uL   nRBC 0.0 0.0 - 0.2 %  Type and screen   Collection Time: 03/10/18  8:14 AM  Result Value Ref Range   ABO/RH(D) A POS    Antibody Screen NEG    Sample Expiration      03/13/2018 Performed at [facility], [address] [place], [place] [address]    Patient  Active Problem List   Diagnosis Date Noted  . Post-dates pregnancy 03/10/2018  . Recurrent vaginitis complicating current pregnancy 12/21/2017  . Umbilical hernia 19/37/9024  . H/O pre-eclampsia in prior pregnancy, currently pregnant 08/19/2017  . Thyroid nodule 08/19/2017  . Supervision of other normal pregnancy, antepartum 08/04/2017  . Cannabis abuse 08/16/2011  . History of suicide attempt 08/15/2011  .  Recurrent major depression-severe ([place]) 08/15/2011  . Anxiety 08/15/2011   Assessment: Megan Webb is a 23 y.o. G2P1001 at [redacted]w[redacted]d here for IOL for PD.   #Labor: Latent, starting Pitocin #Pain: Well-controlled, plan for Epidural #FWB: Category 1 #ID:  GBS negative #MOF: Breast #MOC:Condoms #Circ:  N/A  Daisy Floro 03/10/2018, 10:18 AM   CNM attestation:  I have seen and examined this patient; I agree with above documentation in the resident's note.   Megan Webb is a 23 y.o. G2P1001 here for postdates IOL; EFW 8+11 on 2/12  PE: BP 117/62   Pulse 93   Resp (!) 22   Ht 5\' 3"  (1.6 m)   Wt 81.7 kg   LMP 05/13/2017 (Exact Date)   BMI 31.92 kg/m  Gen: calm comfortable, NAD Resp: normal effort, no distress Abd: gravid  ROS, labs, PMH reviewed  Plan: Admit to Labor and Delivery Plan Pitocin for IOL method as cx is very favorable GBS neg Anticipate SVD, and will be prepared for LGA delivery Offer SW consult PP due to mental health hx (on no meds currently)  Myrtis Ser CNM 03/10/2018, 12:28 PM

## 2018-03-10 NOTE — Lactation Note (Signed)
This note was copied from a baby's chart. Lactation Consultation Note  Patient Name: Megan Webb KKDPT'E Date: 03/10/2018 Reason for consult: Initial assessment   P2, 7 hours and recently breastfed for 15 min. Mother states she breastfed her other daughter for 2.5 years with formula supplementation. She stated she started supplementing on Day 4 because her breasts were full and hard and she could not get breastmilk to flow.  Provided education regarding engorgement. She also noted a dip in her supply when she had a IUD placed. Encouraged her to call lactation if she has problems this time for guidance. Mother hand expressed drops before attempting latch in football hold. Feed on demand approximately 8-12 times per day.   Mom made aware of O/P services, breastfeeding support groups, community resources, and our phone # for post-discharge questions.    Maternal Data Has patient been taught Hand Expression?: Yes Does the patient have breastfeeding experience prior to this delivery?: Yes  Feeding Feeding Type: Breast Fed  LATCH Score                   Interventions Interventions: Breast feeding basics reviewed;Skin to skin;Hand express  Lactation Tools Discussed/Used     Consult Status Consult Status: Follow-up Date: 03/11/18 Follow-up type: In-patient    Vivianne Master [facility] 03/10/2018, 10:40 PM

## 2018-03-10 NOTE — Anesthesia Procedure Notes (Signed)
Epidural Patient location during procedure: OB Start time: 03/10/2018 11:40 AM End time: 03/10/2018 12:08 PM  Staffing Anesthesiologist: Lynda Rainwater, MD Performed: anesthesiologist   Preanesthetic Checklist Completed: patient identified, site marked, surgical consent, pre-op evaluation, timeout performed, IV checked, risks and benefits discussed and monitors and equipment checked  Epidural Patient position: sitting Prep: ChloraPrep Patient monitoring: heart rate, cardiac monitor, continuous pulse ox and blood pressure Approach: midline Location: L2-L3 Injection technique: LOR saline  Needle:  Needle type: Tuohy  Needle gauge: 17 G Needle length: 9 cm Needle insertion depth: 6 cm Catheter type: closed end flexible Catheter size: 20 Guage Catheter at skin depth: 10 cm Test dose: negative  Assessment Events: blood not aspirated, injection not painful, no injection resistance, negative IV test and no paresthesia  Additional Notes Reason for block:procedure for pain

## 2018-03-10 NOTE — Progress Notes (Signed)
LABOR PROGRESS NOTE  Megan Webb is a 23 y.o. G2P1001 at [redacted]w[redacted]d  admitted for IOL for PD.  Subjective: Patient is trying to rest in bed. She is very uncomfortable and tends to scratch at her forehead during contractions.   Objective: BP 138/79   Pulse 87   Temp 98.9 F (37.2 C) (Axillary)   Resp 18   Ht 5\' 3"  (1.6 m)   Wt 81.7 kg   LMP 05/13/2017 (Exact Date)   BMI 31.92 kg/m  or  Vitals:   03/10/18 1330 03/10/18 1357 03/10/18 1400 03/10/18 1430  BP: (!) 114/92  127/90 138/79  Pulse: (!) 108  92 87  Resp: 20  20 18   Temp:  98.9 F (37.2 C)    TempSrc:  Axillary    Weight:      Height:       Dilation: 9 Effacement (%): 100 Cervical Position: Anterior Station: 0 Presentation: Vertex Exam by:: Dr. Emilee Hero: baseline rate 145, minimal varibility, no acel, occasional early decel Toco: Contractions q1-2 minutes  Labs: Lab Results  Component Value Date   WBC 6.9 03/10/2018   HGB 11.9 (L) 03/10/2018   HCT 36.5 03/10/2018   MCV 89.2 03/10/2018   PLT 127 (L) 03/10/2018    Patient Active Problem List   Diagnosis Date Noted  . Post-dates pregnancy 03/10/2018  . Recurrent vaginitis complicating current pregnancy 12/21/2017  . Umbilical hernia 17/49/4496  . H/O pre-eclampsia in prior pregnancy, currently pregnant 08/19/2017  . Thyroid nodule 08/19/2017  . Supervision of other normal pregnancy, antepartum 08/04/2017  . Cannabis abuse 08/16/2011  . History of suicide attempt 08/15/2011  . Recurrent major depression-severe ([place]) 08/15/2011  . Anxiety 08/15/2011    Assessment / Plan: 23 y.o. G2P1001 at [redacted]w[redacted]d here for IOL for PD.  Labor: Active, progressing quickly, remains on 4 of Pit; AROMed at 1442. Fetal Wellbeing:  Category  Pain Control:  Epidural in place, patient feeling a lot of pressure Anticipated MOD:  SVD  Milus Banister, [place], PGY-1 03/10/2018 3:05 PM

## 2018-03-11 LAB — CBC
HCT: 32.4 % — ABNORMAL LOW (ref 36.0–46.0)
Hemoglobin: 10.6 g/dL — ABNORMAL LOW (ref 12.0–15.0)
MCH: 29.1 pg (ref 26.0–34.0)
MCHC: 32.7 g/dL (ref 30.0–36.0)
MCV: 89 fL (ref 80.0–100.0)
Platelets: 129 10*3/uL — ABNORMAL LOW (ref 150–400)
RBC: 3.64 MIL/uL — ABNORMAL LOW (ref 3.87–5.11)
RDW: 16.2 % — ABNORMAL HIGH (ref 11.5–15.5)
WBC: 9.6 10*3/uL (ref 4.0–10.5)
nRBC: 0 % (ref 0.0–0.2)

## 2018-03-11 NOTE — Anesthesia Postprocedure Evaluation (Signed)
Anesthesia Post Note  Patient: Megan Webb  Procedure(s) Performed: AN AD HOC LABOR EPIDURAL     Patient location during evaluation: Mother Baby Anesthesia Type: Epidural Level of consciousness: awake and alert Pain management: pain level controlled Vital Signs Assessment: post-procedure vital signs reviewed and stable Respiratory status: spontaneous breathing Cardiovascular status: blood pressure returned to baseline Postop Assessment: no headache, patient able to bend at knees, no backache, no apparent nausea or vomiting, epidural receding, adequate PO intake and able to ambulate Anesthetic complications: no    Last Vitals:  Vitals:   03/11/18 0311 03/11/18 0536  BP: 114/78 125/82  Pulse: 77 78  Resp: 16 16  Temp: 36.4 C 36.7 C  SpO2: 99% 99%    Last Pain:  Vitals:   03/11/18 0746  TempSrc:   PainSc: 0-No pain   Pain Goal: Patients Stated Pain Goal: 8 (03/10/18 0807)              Epidural/Spinal Function Cutaneous sensation: Normal sensation (03/11/18 0746)  Ailene Ards

## 2018-03-11 NOTE — Progress Notes (Signed)
Post Partum Day 1 Subjective: no complaints, up ad lib, voiding, tolerating PO and + flatus  Objective: Blood pressure 125/82, pulse 78, temperature 98.1 F (36.7 C), temperature source Oral, resp. rate 16, height 5\' 3"  (1.6 m), weight 81.7 kg, last menstrual period 05/13/2017, SpO2 99 %, unknown if currently breastfeeding.  Physical Exam:  General: alert, cooperative and no distress Lochia: appropriate Uterine Fundus: firm Incision: n/a DVT Evaluation: No evidence of DVT seen on physical exam. No cords or calf tenderness. No significant calf/ankle edema.  Recent Labs    03/10/18 1617 03/11/18 0517  HGB 11.1* 10.6*  HCT 34.2* 32.4*    Assessment/Plan: Plan for discharge tomorrow, Breastfeeding and Lactation consult Social Work consult given history of SI   LOS: 1 day   Durwin Glaze 03/11/2018, 8:10 AM

## 2018-03-11 NOTE — Lactation Note (Signed)
This note was copied from a baby's chart. Lactation Consultation Note  Patient Name: Megan Webb Date: 03/11/2018 Reason for consult: Follow-up assessment;Nipple pain/trauma [name] is 79 hours old.  Mom reports that she is having some difficulty latching baby to breast.  Baby is currently latched well to left breast in cradle hold.  Mom c/o nipple soreness.  When baby came off breast nipple round but small abrasion noted on tip.  Comfort gels given with instructions.  On oral exam infant noted to have a short lingual frenulum.  Baby extends tongue well but appears to have some restriction elevating tongue.  Assisted with football and cross cradle on opposite breast.  Baby fussy and unable to latch.  Positioned mom is side lying position and baby latched well but became sleepy soon after. Instructed to feed with cues and call for assist prn.  Maternal Data    Feeding Feeding Type: Breast Fed  LATCH Score Latch: Repeated attempts needed to sustain latch, nipple held in mouth throughout feeding, stimulation needed to elicit sucking reflex.  Audible Swallowing: A few with stimulation  Type of Nipple: Everted at rest and after stimulation  Comfort (Breast/Nipple): Filling, red/small blisters or bruises, mild/mod discomfort  Hold (Positioning): Assistance needed to correctly position infant at breast and maintain latch.  LATCH Score: 6  Interventions Interventions: Assisted with latch;Breast compression;Skin to skin;Adjust position;Comfort gels;Breast massage;Support pillows;Hand express;Position options  Lactation Tools Discussed/Used     Consult Status Consult Status: Follow-up Date: 03/12/18 Follow-up type: In-patient    Ave Filter 03/11/2018, 3:25 PM

## 2018-03-11 NOTE — Progress Notes (Signed)
MOB was referred for history of depression/anxiety. * Referral screened out by Clinical Social Worker because none of the following criteria appear to apply: ~ History of anxiety/depression during this pregnancy, or of post-partum depression following prior delivery. ~ Diagnosis of anxiety and/or depression within last 3 years. No concerns noted in OB record. OR * MOB's symptoms currently being treated with medication and/or therapy.  Please contact the Clinical Social Worker if needs arise, by MOB request, or if MOB scores greater than 9/yes to question 10 on [place] Postpartum Depression Screen.  Azilee Pirro Boyd-Gilyard, MSW, LCSW Clinical Social Work [phone]   

## 2018-03-12 DIAGNOSIS — D649 Anemia, unspecified: Secondary | ICD-10-CM

## 2018-03-12 MED ORDER — IBUPROFEN 600 MG PO TABS: 600.0000 mg | ORAL_TABLET | Freq: Four times a day (QID) | ORAL | 0 refills | Status: DC

## 2018-03-12 NOTE — Lactation Note (Signed)
This note was copied from a baby's chart. Lactation Consultation Note  Patient Name: Megan Webb NHAFB'X Date: 03/12/2018 Reason for consult: Follow-up assessment Mom states baby just finished a 20 minute feeding.  She reports latch is getting better and nipples no worse.  Tongue Tie resources given to mom.  Recommended she talk to pediatrician about tongue tomorrow.  She feels breast are filling.  Discussed prevention and treatment of engorgement.  She has a breast pump at home.  24 mm nipple shield given with instructions to use if difficult or painful latch.  Lactation outpatient services and support reviewed and encouraged prn.  Maternal Data    Feeding Feeding Type: Breast Fed  LATCH Score Latch: Repeated attempts needed to sustain latch, nipple held in mouth throughout feeding, stimulation needed to elicit sucking reflex.  Audible Swallowing: A few with stimulation  Type of Nipple: Everted at rest and after stimulation  Comfort (Breast/Nipple): Soft / non-tender  Hold (Positioning): Assistance needed to correctly position infant at breast and maintain latch.  LATCH Score: 7  Interventions    Lactation Tools Discussed/Used     Consult Status Consult Status: Complete Follow-up type: Call as needed    Ave Filter 03/12/2018, 8:47 AM

## 2018-03-13 MED FILL — IBUPROFEN 600 MG TABLET: 600 | 8 days supply | Qty: 30 | Fill #0

## 2018-03-27 DIAGNOSIS — L700 Acne vulgaris: Secondary | ICD-10-CM | POA: Diagnosis not present

## 2018-03-27 DIAGNOSIS — L720 Epidermal cyst: Secondary | ICD-10-CM | POA: Diagnosis not present

## 2018-04-10 ENCOUNTER — Ambulatory Visit: Payer: BLUE CROSS/BLUE SHIELD | Admitting: Family Medicine

## 2018-04-14 ENCOUNTER — Encounter: Payer: Self-pay | Admitting: Family Medicine

## 2018-04-14 ENCOUNTER — Ambulatory Visit (INDEPENDENT_AMBULATORY_CARE_PROVIDER_SITE_OTHER): Payer: BLUE CROSS/BLUE SHIELD | Admitting: Family Medicine

## 2018-04-14 ENCOUNTER — Other Ambulatory Visit: Payer: Self-pay

## 2018-04-14 DIAGNOSIS — Z1389 Encounter for screening for other disorder: Secondary | ICD-10-CM | POA: Diagnosis not present

## 2018-04-14 DIAGNOSIS — F419 Anxiety disorder, unspecified: Secondary | ICD-10-CM

## 2018-04-14 NOTE — Progress Notes (Signed)
Subjective:     Megan Webb is a 23 y.o. female who presents for a postpartum visit. She is 5 weeks postpartum following a spontaneous vaginal delivery. I have fully reviewed the prenatal and intrapartum course. The delivery was at 40 gestational weeks. Outcome: spontaneous vaginal delivery. Anesthesia: epidural. Postpartum course has been uneventful Baby's course has been uneventful. Baby is feeding by breast. Bleeding is light spotting. Bowel function is normal. Bladder function is normal. Patient is sexually active. Contraception method is none. Postpartum depression screening: negative. Score 2    Review of Systems Pertinent items are noted in HPI.   Objective:    There were no vitals taken for this visit.  General:  alert, cooperative and no distress  Lungs: clear to auscultation bilaterally  Heart:  regular rate and rhythm, S1, S2 normal, no murmur, click, rub or gallop  Abdomen: soft, non-tender; bowel sounds normal; no masses,  no organomegaly        Assessment:     Normal postpartum exam. Pap smear not done at today's visit.  Patient last pap 03-2016 and was normal.  Plan:    1. Contraception: condoms 2. Follow up in: 1 year or as needed.

## 2018-05-15 MED FILL — FLUCONAZOLE 150 MG TABS: 150 | 1 days supply | Qty: 1 | Fill #2

## 2018-06-28 ENCOUNTER — Telehealth: Payer: Self-pay | Admitting: Obstetrics & Gynecology

## 2018-06-28 MED ORDER — FLUCONAZOLE 150 MG PO TABS: 150.0000 mg | ORAL_TABLET | Freq: Once | ORAL | 1 refills | Status: AC

## 2018-06-28 NOTE — Addendum Note (Signed)
Addended by: Phill Myron on: 06/28/2018 04:16 PM   Modules accepted: Orders

## 2018-06-28 NOTE — Telephone Encounter (Signed)
Patient called, she thinks she has a yeast infection.  She is requesting an Rx for Diflucan called to her pharmacy.  She uses [facility]. [place].

## 2018-06-28 NOTE — Telephone Encounter (Signed)
Patient is having vaginal irritation and cottage cheese like discharge for two days.

## 2018-08-17 ENCOUNTER — Telehealth: Payer: Medicaid Other | Admitting: Physician Assistant

## 2018-08-17 DIAGNOSIS — B3731 Acute candidiasis of vulva and vagina: Secondary | ICD-10-CM

## 2018-08-17 DIAGNOSIS — B373 Candidiasis of vulva and vagina: Secondary | ICD-10-CM | POA: Diagnosis not present

## 2018-08-17 MED ORDER — FLUCONAZOLE 150 MG PO TABS: 150.0000 mg | ORAL_TABLET | Freq: Once | ORAL | 0 refills | Status: AC

## 2018-08-17 NOTE — Progress Notes (Signed)
I have spent 5 minutes in review of e-visit questionnaire, review and updating patient chart, medical decision making and response to patient.   Jayni Prescher Cody Camden Knotek, PA-C    

## 2018-08-17 NOTE — Progress Notes (Signed)

## 2018-09-07 ENCOUNTER — Other Ambulatory Visit: Payer: Self-pay

## 2018-09-07 MED ORDER — FLUCONAZOLE 150 MG PO TABS: 150.0000 mg | ORAL_TABLET | Freq: Once | ORAL | 3 refills | Status: AC

## 2018-11-09 ENCOUNTER — Other Ambulatory Visit: Payer: Self-pay

## 2018-11-09 DIAGNOSIS — Z20822 Contact with and (suspected) exposure to covid-19: Secondary | ICD-10-CM

## 2018-11-11 LAB — NOVEL CORONAVIRUS, NAA: SARS-CoV-2, NAA: NOT DETECTED

## 2018-11-23 MED ORDER — CITALOPRAM HYDROBROMIDE 20 MG PO TABS: 20.0000 mg | ORAL_TABLET | Freq: Every day | ORAL | 12 refills | Status: DC

## 2018-11-23 NOTE — Addendum Note (Signed)
Addended by: Truett Mainland on: 11/23/2018 01:04 PM   Modules accepted: Orders

## 2018-11-23 NOTE — Addendum Note (Signed)
Addended by: Truett Mainland on: 11/23/2018 02:28 PM   Modules accepted: Orders

## 2019-03-31 ENCOUNTER — Telehealth: Payer: Medicaid Other | Admitting: Nurse Practitioner

## 2019-03-31 DIAGNOSIS — M545 Low back pain, unspecified: Secondary | ICD-10-CM

## 2019-03-31 DIAGNOSIS — N898 Other specified noninflammatory disorders of vagina: Secondary | ICD-10-CM

## 2019-03-31 DIAGNOSIS — R300 Dysuria: Secondary | ICD-10-CM

## 2019-03-31 NOTE — Progress Notes (Signed)
Based on what you shared with me it looks like you have several issues ,that should be evaluated in a face to face office visit. First , yeast infection does ot usually produce a yellowish green discharge. So that needs to be looked at. Second any UTI with associated back pain requires a urinalysis and urine culture. We have suggestions below for places you can go to be evaluated or you can try contacting your primary Care Provider. Sorry I could not be more assistance to you at this time.    NOTE: If you entered your credit card information for this eVisit, you will not be charged. You may see a "hold" on your card for the $35 but that hold will drop off and you will not have a charge processed.  If you are having a true medical emergency please call 911.     For an urgent face to face visit, [facility] has four urgent care centers for your convenience:   . [facility]    [phone]                  Get Driving Directions  [id number] [place], [place] [address] . 10 am to 8 pm Monday-Friday . 12 pm to 8 pm Saturday-Sunday   . [facility] [place]                  Get Driving Directions  [id number] [place], [place], [place] [address] . 8 am to 8 pm Monday-Friday . 9 am to 6 pm Saturday . 11 am to 6 pm Sunday   . [facility] at [place]                  Get Driving Directions   [address] [place], [place] [address] . 8 am to 8 pm Monday-Friday . 8 am to 4 pm Saturday-Sunday    . [facility] at [place]                    Get Driving Directions  [id number]  [address], [place], [place] [address]  . Monday-Friday, 12 PM to 6 PM    Your e-visit answers were reviewed by a board certified advanced clinical practitioner to complete your personal care plan.  Thank you for using e-Visits.

## 2019-04-01 ENCOUNTER — Encounter (HOSPITAL_COMMUNITY): Payer: Self-pay

## 2019-04-01 ENCOUNTER — Other Ambulatory Visit: Payer: Self-pay

## 2019-04-01 ENCOUNTER — Ambulatory Visit (HOSPITAL_COMMUNITY)
Admission: EM | Admit: 2019-04-01 | Discharge: 2019-04-01 | Disposition: A | Discharge status: Home / Self Care | Payer: Managed Care, Other (non HMO) | Attending: Emergency Medicine | Admitting: Emergency Medicine

## 2019-04-01 DIAGNOSIS — N760 Acute vaginitis: Secondary | ICD-10-CM | POA: Insufficient documentation

## 2019-04-01 DIAGNOSIS — M545 Low back pain, unspecified: Secondary | ICD-10-CM

## 2019-04-01 DIAGNOSIS — Z3202 Encounter for pregnancy test, result negative: Secondary | ICD-10-CM | POA: Diagnosis not present

## 2019-04-01 LAB — POCT URINALYSIS DIP (DEVICE)
Bilirubin Urine: NEGATIVE
Glucose, UA: NEGATIVE mg/dL
Ketones, ur: NEGATIVE mg/dL
Nitrite: NEGATIVE
Protein, ur: NEGATIVE mg/dL
Specific Gravity, Urine: 1.025 (ref 1.005–1.030)
Urobilinogen, UA: 0.2 mg/dL (ref 0.0–1.0)
pH: 7 (ref 5.0–8.0)

## 2019-04-01 LAB — POCT PREGNANCY, URINE: Preg Test, Ur: NEGATIVE

## 2019-04-01 LAB — POC URINE PREG, ED
Preg Test, Ur: NEGATIVE
Preg Test, Ur: NEGATIVE

## 2019-04-01 MED ORDER — NAPROXEN 500 MG PO TABS: 500.0000 mg | ORAL_TABLET | Freq: Two times a day (BID) | ORAL | 0 refills | Status: DC

## 2019-04-01 MED ORDER — FLUCONAZOLE 150 MG PO TABS: 150.0000 mg | ORAL_TABLET | Freq: Once | ORAL | 0 refills | Status: AC

## 2019-04-01 NOTE — ED Provider Notes (Signed)
[place]    CSN: [id number] Arrival date & time: 04/01/19  1255      History   Chief Complaint Chief Complaint  Patient presents with  . Back Pain  . Dysuria    HPI Megan Webb is a 24 y.o. female presenting today for evaluation of left flank pain and urinary/vaginal symptoms.  Patient states that proximally 1 week ago she began to develop discomfort to her left lower back that extends into her lower abdomen.  She notes that she was also having some vaginal discharge which she describes as yellow.  Also associated itching, irritation and noted erythema to her external genital area.  Does report history of yeast infections with similar symptoms.  Patient started menstrual cycle yesterday, pain different than typical pain she experiences with menstrual cycle.  She also developed some dysuria yesterday as well.  Has had prior UTI before, but more recurrent history of yeast.  Denies fevers, nausea or vomiting.  Patient is sexually active, but denies any new partners or specific concerns for STDs.  HPI  Past Medical History:  Diagnosis Date  . Anemia    on Iron supplements  . Anxiety    in the past; not current  . Depression    in the past; not current  . Headache    has migraines, takes Extra Strength Tylenol with minimal relief  . Hyperthyroidism    nodule on thyroid; but not diagnosed with hyperthyroidism  . Umbilical hernia [id number]    Patient Active Problem List   Diagnosis Date Noted  . Anemia 03/12/2018  . Umbilical hernia [id number]  . Thyroid nodule 08/19/2017  . Cannabis abuse 08/16/2011  . History of suicide attempt 08/15/2011  . Anxiety 08/15/2011    Past Surgical History:  Procedure Laterality Date  . BUNIONECTOMY      OB History    Gravida  2   Para  2   Term  2   Preterm  0   AB  0   Living  2     SAB  0   TAB  0   Ectopic  0   Multiple  0   Live Births  2            Home Medications    Prior to  Admission medications   Medication Sig Start Date End Date Taking? Authorizing Provider  ferrous sulfate (FERROUSUL) 325 (65 FE) MG tablet Take 1 tablet (325 mg total) by mouth 2 (two) times daily. 12/08/17   Lavonia Drafts, MD  fluconazole (DIFLUCAN) 150 MG tablet Take 1 tablet (150 mg total) by mouth once for 1 dose. 04/01/19 04/01/19  Sang Blount C, PA-C  ibuprofen (ADVIL,MOTRIN) 600 MG tablet Take 1 tablet (600 mg total) by mouth every 6 (six) hours. 03/12/18   Aura Camps, MD  naproxen (NAPROSYN) 500 MG tablet Take 1 tablet (500 mg total) by mouth 2 (two) times daily. 04/01/19   Vayla Wilhelmi C, PA-C  Prenatal Multivit-Min-Fe-FA (PRENATAL VITAMINS PO) Take by mouth.    [provider]  citalopram (CELEXA) 20 MG tablet Take 1 tablet (20 mg total) by mouth daily. 11/23/18 04/01/19  Truett Mainland, DO    Family History Family History  Problem Relation Age of Onset  . Mental illness Mother        anxiety and depression  . Hypertension Mother   . Kidney disease Mother   . Anemia Mother   . Cataracts Father   . Asthma Brother   .  Diabetes Maternal Aunt   . Cancer Maternal Aunt        breast  . Diabetes Maternal Uncle   . Arthritis Maternal Grandmother   . Stroke Maternal Grandmother   . Hypertension Maternal Grandmother   . Arthritis Maternal Grandfather   . Hypertension Maternal Grandfather   . Stroke Maternal Grandfather   . Arthritis Paternal Grandfather   . Stroke Paternal Grandfather     Social History Social History   Tobacco Use  . Smoking status: Never Smoker  . Smokeless tobacco: Never Used  Substance Use Topics  . Alcohol use: No    Comment: occ  . Drug use: No    Frequency: 1.0 times per week    Types: Marijuana     Allergies   Patient has no known allergies.   Review of Systems Review of Systems  Constitutional: Negative for fever.  Respiratory: Negative for shortness of breath.   Cardiovascular: Negative for chest pain.    Gastrointestinal: Negative for abdominal pain, diarrhea, nausea and vomiting.  Genitourinary: Positive for dysuria, flank pain and vaginal discharge. Negative for genital sores, hematuria, menstrual problem, vaginal bleeding and vaginal pain.  Musculoskeletal: Positive for back pain.  Skin: Negative for rash.  Neurological: Negative for dizziness, light-headedness and headaches.     Physical Exam Triage Vital Signs ED Triage Vitals  Enc Vitals Group     BP 04/01/19 1325 135/85     Pulse Rate 04/01/19 1325 88     Resp 04/01/19 1325 16     Temp 04/01/19 1325 99 F (37.2 C)     Temp Source 04/01/19 1325 Oral     SpO2 04/01/19 1325 99 %     Weight --      Height --      Head Circumference --      Peak Flow --      Pain Score 04/01/19 1331 4     Pain Loc --      Pain Edu? --      Excl. in [place]? --    No data found.  Updated Vital Signs BP 135/85 (BP Location: Left Arm)   Pulse 88   Temp 99 F (37.2 C) (Oral)   Resp 16   LMP 03/31/2019   SpO2 99%   Visual Acuity Right Eye Distance:   Left Eye Distance:   Bilateral Distance:    Right Eye Near:   Left Eye Near:    Bilateral Near:     Physical Exam Vitals and nursing note reviewed.  Constitutional:      General: She is not in acute distress.    Appearance: She is well-developed.  HENT:     Head: Normocephalic and atraumatic.  Eyes:     Conjunctiva/sclera: Conjunctivae normal.  Cardiovascular:     Rate and Rhythm: Normal rate and regular rhythm.     Heart sounds: No murmur.  Pulmonary:     Effort: Pulmonary effort is normal. No respiratory distress.     Breath sounds: Normal breath sounds.  Abdominal:     Palpations: Abdomen is soft.     Tenderness: There is abdominal tenderness.     Comments: Soft, nondistended, tenderness to palpation to LLQ, extending to left flank, no rebound, negative rovsing  Genitourinary:    Comments: Normal external female genitalia, no rashes or lesions noted, mucosa does appear  slightly more erythematous, moderate amount of bright red blood in vagina, due to amount of bleeding, difficult to visualize other discharge, does appear  to have a mucousy discharge with swabbing cervix.  Mild discomfort with exam Musculoskeletal:     Cervical back: Neck supple.  Skin:    General: Skin is warm and dry.  Neurological:     Mental Status: She is alert.      UC Treatments / Results  Labs (all labs ordered are listed, but only abnormal results are displayed) Labs Reviewed  POCT URINALYSIS DIP (DEVICE) - Abnormal; Notable for the following components:      Result Value   Hgb urine dipstick LARGE (*)    Leukocytes,Ua TRACE (*)    All other components within normal limits  URINE CULTURE  POC URINE PREG, ED  CERVICOVAGINAL ANCILLARY ONLY    EKG   Radiology No results found.  Procedures Procedures (including critical care time)  Medications Ordered in UC Medications - No data to display  Initial Impression / Assessment and Plan / UC Course  I have reviewed the triage vital signs and the nursing notes.  Pertinent labs & imaging results that were available during my care of the patient were reviewed by me and considered in my medical decision making (see chart for details).     UA with only trace leuks, not suggestive of UTI at this time, will send for culture to confirm.  Vaginal swab pending to evaluate for STDs as well as yeast and BV.  Empirically treating for yeast, but would not expect this to cause discomfort patient has associated with this.  Will call with results and provide further treatment as needed.  Naprosyn for pain in the meantime.  Continue to monitor,Discussed strict return precautions. Patient verbalized understanding and is agreeable with plan.  Final Clinical Impressions(s) / UC Diagnoses   Final diagnoses:  Acute right-sided low back pain without sciatica  Vaginitis and vulvovaginitis     Discharge Instructions     Your urine was not  suggestive of UTI at this time, I will send for culture to confirm.  Please take 1 tablet of Diflucan today to treat for yeast, may repeat second tablet if still having symptoms in 3 to 4 days and swab returns positive for yeast.  Please use Naprosyn twice daily as needed for pain at this time.  We are testing you for Gonorrhea, Chlamydia, Trichomonas, Yeast and Bacterial Vaginosis. We will call you if anything is positive and let you know if you require any further treatment. Please inform partners of any positive results.   Please return if symptoms not improving with treatment, development of fever, nausea, vomiting, abdominal pain.    ED Prescriptions    Medication Sig Dispense Auth. Provider   naproxen (NAPROSYN) 500 MG tablet Take 1 tablet (500 mg total) by mouth 2 (two) times daily. 30 tablet Batina Dougan C, PA-C   fluconazole (DIFLUCAN) 150 MG tablet Take 1 tablet (150 mg total) by mouth once for 1 dose. 2 tablet Ollin Hochmuth, [place] C, PA-C     PDMP not reviewed this encounter.   Janith Lima, PA-C 04/01/19 1421

## 2019-04-01 NOTE — Discharge Instructions (Signed)
Your urine was not suggestive of UTI at this time, I will send for culture to confirm.  Please take 1 tablet of Diflucan today to treat for yeast, may repeat second tablet if still having symptoms in 3 to 4 days and swab returns positive for yeast.  Please use Naprosyn twice daily as needed for pain at this time.  We are testing you for Gonorrhea, Chlamydia, Trichomonas, Yeast and Bacterial Vaginosis. We will call you if anything is positive and let you know if you require any further treatment. Please inform partners of any positive results.   Please return if symptoms not improving with treatment, development of fever, nausea, vomiting, abdominal pain.

## 2019-04-01 NOTE — ED Triage Notes (Signed)
Pt c/o left lower back pain onset approx 1 week ago; onset of burning with urination yesterday; also c/o vaginal irritation/redness and vaginal discharge.  Lower left abdominal pain for approx 2-3 days.  Denies fever, n/v/d.

## 2019-04-01 NOTE — ED Notes (Signed)
Pt currently menstruating.

## 2019-04-02 LAB — URINE CULTURE: Culture: NO GROWTH

## 2019-04-03 LAB — CERVICOVAGINAL ANCILLARY ONLY
Bacterial vaginitis: NEGATIVE
Candida vaginitis: POSITIVE — AB
Chlamydia: NEGATIVE
Neisseria Gonorrhea: NEGATIVE
Trichomonas: NEGATIVE

## 2019-04-13 ENCOUNTER — Other Ambulatory Visit: Payer: Self-pay

## 2019-04-13 ENCOUNTER — Ambulatory Visit (INDEPENDENT_AMBULATORY_CARE_PROVIDER_SITE_OTHER): Payer: Managed Care, Other (non HMO) | Admitting: Family Medicine

## 2019-04-13 ENCOUNTER — Encounter: Payer: Self-pay | Admitting: Family Medicine

## 2019-04-13 VITALS — BP 114/72 | HR 74 | Temp 97.2°F | Ht 63.0 in | Wt 156.2 lb

## 2019-04-13 DIAGNOSIS — Z0000 Encounter for general adult medical examination without abnormal findings: Secondary | ICD-10-CM | POA: Diagnosis not present

## 2019-04-13 DIAGNOSIS — G43009 Migraine without aura, not intractable, without status migrainosus: Secondary | ICD-10-CM

## 2019-04-13 DIAGNOSIS — Z3009 Encounter for other general counseling and advice on contraception: Secondary | ICD-10-CM

## 2019-04-13 MED ORDER — NORGESTIMATE-ETH ESTRADIOL 0.25-35 MG-MCG PO TABS: 1.0000 | ORAL_TABLET | Freq: Every day | ORAL | 11 refills | Status: DC

## 2019-04-13 MED ORDER — SUMATRIPTAN SUCCINATE 100 MG PO TABS: 100.0000 mg | ORAL_TABLET | ORAL | 0 refills | Status: DC | PRN

## 2019-04-13 NOTE — Progress Notes (Signed)
Chief Complaint  Patient presents with  . Follow-up    Covid in November and headaches are worse  . Vaginitis     Well Woman Megan Webb is here for a complete physical.   Her last physical was >1 year ago.  Current diet: in general, a "healthy" diet. Current exercise: wt resistance; Probation officer. Patient's last menstrual period was 03/31/2019. Seatbelt? Yes  +R sided headaches lasting 3-4 days. +light sensitivity. Ibuprofen helps a little, does not fully go away. No famhx of migraines. +stress, more headaches with more stress. Has never had treatment.  They do not appear to be related to her menstrual cycle.  She will get approximately 8 days with a headache per month.  This is been going on for the last 4 months whereas her headaches were less severe prior to that.  She did have Covid and thinks this started a cascade of worsening headaches.  Patient has 2 young daughters.  While she is not fully certain she is done having kids, she would like something for birth control at this time.  She did not do well with a progestin only pill.  She does have a gynecologist.  She is thinking about the Nexplanon.  Health Maintenance Pap/HPV- No Tetanus- Yes  HIV screening- Yes  Past Medical History:  Diagnosis Date  . Anemia    on Iron supplements  . Anxiety    in the past; not current  . Depression    in the past; not current  . Headache    has migraines, takes Extra Strength Tylenol with minimal relief  . Hyperthyroidism    nodule on thyroid; but not diagnosed with hyperthyroidism  . Umbilical hernia [id number]     Past Surgical History:  Procedure Laterality Date  . BUNIONECTOMY     Medications  Current Outpatient Medications on File Prior to Visit  Medication Sig Dispense Refill  . ibuprofen (ADVIL,MOTRIN) 600 MG tablet Take 1 tablet (600 mg total) by mouth every 6 (six) hours. 30 tablet 0  . [DISCONTINUED] citalopram (CELEXA) 20 MG tablet Take 1 tablet (20 mg total) by  mouth daily. 30 tablet 12   Allergies No Known Allergies  Review of Systems: Constitutional:  no unexpected weight changes Eye:  no recent significant change in vision Ear/Nose/Mouth/Throat:  Ears:  no tinnitus or vertigo and no recent change in hearing Nose/Mouth/Throat:  no complaints of nasal congestion, no sore throat Cardiovascular: no chest pain Respiratory:  no cough and no shortness of breath Gastrointestinal:  no abdominal pain, no change in bowel habits GU:  Female: negative for dysuria or pelvic pain Musculoskeletal/Extremities:  no pain of the joints Integumentary (Skin/Breast):  no abnormal skin lesions reported Neurologic:  + headaches Endocrine:  denies fatigue Hematologic/Lymphatic:  No areas of easy bleeding  Exam BP 114/72 (BP Location: Left Arm, Patient Position: Sitting, Cuff Size: Normal)   Pulse 74   Temp (!) 97.2 F (36.2 C) (Temporal)   Ht 5\' 3"  (1.6 m)   Wt 156 lb 4 oz (70.9 kg)   LMP 03/31/2019   SpO2 97%   BMI 27.68 kg/m  General:  well developed, well nourished, in no apparent distress Skin:  no significant moles, warts, or growths Head:  no masses, lesions, or tenderness Eyes:  pupils equal and round, sclera anicteric without injection Ears:  canals without lesions, TMs shiny without retraction, no obvious effusion, no erythema Nose:  nares patent, septum midline, mucosa normal, and no drainage or sinus tenderness Throat/Pharynx:  lips and gingiva without lesion; tongue and uvula midline; non-inflamed pharynx; no exudates or postnasal drainage Neck: neck supple without adenopathy, thyromegaly, or masses Lungs:  clear to auscultation, breath sounds equal bilaterally, no respiratory distress Cardio:  regular rate and rhythm, no bruits, no LE edema Abdomen:  abdomen soft, nontender; bowel sounds normal; no masses or organomegaly Genital: Defer to GYN Musculoskeletal: No tenderness over the TMJ bilaterally, temporalis bilaterally, suboccipital  triangle, or cervical paraspinal musculature; symmetrical muscle groups noted without atrophy or deformity Extremities:  no clubbing, cyanosis, or edema, no deformities, no skin discoloration Neuro:  gait normal; deep tendon reflexes normal and symmetric Psych: well oriented with normal range of affect and appropriate judgment/insight  Assessment and Plan  Well adult exam  Migraine without aura and without status migrainosus, not intractable - Plan: SUMAtriptan (IMITREX) 100 MG tablet   Well 24 y.o. female. Counseled on diet and exercise. Other orders as above. Given the frequency of headaches, will start Imitrex.  Could consider ibuprofen/Aleve or Tylenol. Start OCP.  The for long-acting reversible contraceptive to GYN team.  She should contact them anyways for her Pap smear which she is due for. Follow up in 1 month to recheck headaches. The patient voiced understanding and agreement to the plan.  [place], DO 04/14/19 6:12 AM

## 2019-04-13 NOTE — Patient Instructions (Signed)
Give [place] 2-3 business days to get the results of your labs back.   Consider taking 1/2 tab of the Imitrex (HA medicine) as side effects can be grogginess.  Keep the diet clean and stay active.  Call Dr Glenna Durand office regarding your pap smear.   Let us know if you need anything.

## 2019-04-14 DIAGNOSIS — G43009 Migraine without aura, not intractable, without status migrainosus: Secondary | ICD-10-CM | POA: Insufficient documentation

## 2019-04-23 ENCOUNTER — Telehealth: Payer: Managed Care, Other (non HMO) | Admitting: Physician Assistant

## 2019-04-23 DIAGNOSIS — N898 Other specified noninflammatory disorders of vagina: Secondary | ICD-10-CM | POA: Diagnosis not present

## 2019-04-23 MED ORDER — FLUCONAZOLE 150 MG PO TABS: 150.0000 mg | ORAL_TABLET | ORAL | 0 refills | Status: AC

## 2019-04-23 NOTE — Progress Notes (Signed)
We are sorry that you are not feeling well. Here is how we plan to help! Based on what you shared with me it looks like you: May have a yeast vaginosis  Vaginosis is an inflammation of the vagina that can result in discharge, itching and pain. The cause is usually a change in the normal balance of vaginal bacteria or an infection. Vaginosis can also result from reduced estrogen levels after menopause.  The most common causes of vaginosis are:   Bacterial vaginosis which results from an overgrowth of one on several organisms that are normally present in your vagina.   Yeast infections which are caused by a naturally occurring fungus called candida.   Vaginal atrophy (atrophic vaginosis) which results from the thinning of the vagina from reduced estrogen levels after menopause.   Trichomoniasis which is caused by a parasite and is commonly transmitted by sexual intercourse.  Factors that increase your risk of developing vaginosis include: Marland Kitchen Medications, such as antibiotics and steroids . Uncontrolled diabetes . Use of hygiene products such as bubble bath, vaginal spray or vaginal deodorant . Douching . Wearing damp or tight-fitting clothing . Using an intrauterine device (IUD) for birth control . Hormonal changes, such as those associated with pregnancy, birth control pills or menopause . Sexual activity . Having a sexually transmitted infection  Your treatment plan is Diflucan (fluconazole) 150mg  tablet 2 tablets to be taken every other day.  I have electronically sent this prescription into the pharmacy that you have chosen. Please note that since you were recently treated for a yeast infection earlier this month that if your symptoms do not resolve with this treatment you will need to be evaluated in person due to concern for resistant infection.   Be sure to take all of the medication as directed. Stop taking any medication if you develop a rash, tongue swelling or shortness of breath.  Mothers who are breast feeding should consider pumping and discarding their breast milk while on these antibiotics. However, there is no consensus that infant exposure at these doses would be harmful.  Remember that medication creams can weaken latex condoms. Marland Kitchen   HOME CARE:  Good hygiene may prevent some types of vaginosis from recurring and may relieve some symptoms:  . Avoid baths, hot tubs and whirlpool spas. Rinse soap from your outer genital area after a shower, and dry the area well to prevent irritation. Don't use scented or harsh soaps, such as those with deodorant or antibacterial action. Marland Kitchen Avoid irritants. These include scented tampons and pads. . Wipe from front to back after using the toilet. Doing so avoids spreading fecal bacteria to your vagina.  Other things that may help prevent vaginosis include:  Marland Kitchen Don't douche. Your vagina doesn't require cleansing other than normal bathing. Repetitive douching disrupts the normal organisms that reside in the vagina and can actually increase your risk of vaginal infection. Douching won't clear up a vaginal infection. . Use a latex condom. Both female and female latex condoms may help you avoid infections spread by sexual contact. . Wear cotton underwear. Also wear pantyhose with a cotton crotch. If you feel comfortable without it, skip wearing underwear to bed. Yeast thrives in [employer] Your symptoms should improve in the next day or two.  GET HELP RIGHT AWAY IF:  . You have pain in your lower abdomen ( pelvic area or over your ovaries) . You develop nausea or vomiting . You develop a fever . Your discharge changes or worsens .  You have persistent pain with intercourse . You develop shortness of breath, a rapid pulse, or you faint.  These symptoms could be signs of problems or infections that need to be evaluated by a medical provider now.  MAKE SURE YOU    Understand these instructions.  Will watch your  condition.  Will get help right away if you are not doing well or get worse.  Your e-visit answers were reviewed by a board certified advanced clinical practitioner to complete your personal care plan. Depending upon the condition, your plan could have included both over the counter or prescription medications. Please review your pharmacy choice to make sure that you have choses a pharmacy that is open for you to pick up any needed prescription, Your safety is important to [place]. If you have drug allergies check your prescription carefully.   You can use MyChart to ask questions about today's visit, request a non-urgent call back, or ask for a work or school excuse for 24 hours related to this e-Visit. If it has been greater than 24 hours you will need to follow up with your provider, or enter a new e-Visit to address those concerns. You will get a MyChart message within the next two days asking about your experience. I hope that your e-visit has been valuable and will speed your recovery.  Greater than 5 minutes, yet less than 10 minutes of time have been spent researching, coordinating, and implementing care for this patient today.

## 2019-05-18 ENCOUNTER — Ambulatory Visit: Payer: Managed Care, Other (non HMO) | Admitting: Family Medicine

## 2019-06-19 ENCOUNTER — Ambulatory Visit (INDEPENDENT_AMBULATORY_CARE_PROVIDER_SITE_OTHER): Payer: Managed Care, Other (non HMO) | Admitting: Family

## 2019-06-19 ENCOUNTER — Other Ambulatory Visit: Payer: Self-pay

## 2019-06-19 ENCOUNTER — Encounter: Payer: Self-pay | Admitting: Family

## 2019-06-19 VITALS — BP 134/76 | HR 72 | Temp 97.5°F | Resp 16 | Ht 63.0 in | Wt 151.0 lb

## 2019-06-19 DIAGNOSIS — N6452 Nipple discharge: Secondary | ICD-10-CM | POA: Diagnosis not present

## 2019-06-19 NOTE — Patient Instructions (Addendum)
You should be contacted about scheduling your breast imaging.  Please call if increased pain/swelling, redness or if you develop fever.

## 2019-06-19 NOTE — Progress Notes (Signed)
Subjective:    Patient ID: Megan Webb, female    DOB: [birth year], 24 y.o.   MRN: [id number]  HPI  Patient is a 24 yr old female who presents today with chief complaint of breast mass. Reports that she first noticed the mass on her right breast 4 weeks ago. Reports that she has had some yellow discharge from the nipple with squeezing. Reports that the mass is no longer palpable but that the right side of her breast is still tender.   Review of Systems See HPI  Past Medical History:  Diagnosis Date  . Anemia    on Iron supplements  . Anxiety    in the past; not current  . Depression    in the past; not current  . Headache    has migraines, takes Extra Strength Tylenol with minimal relief  . Hyperthyroidism    nodule on thyroid; but not diagnosed with hyperthyroidism  . Umbilical hernia [id number]     Social History   Socioeconomic History  . Marital status: Married    Spouse name: Not on file  . Number of children: 1  . Years of education: Not on file  . Highest education level: Not on file  Occupational History  . Not on file  Tobacco Use  . Smoking status: Never Smoker  . Smokeless tobacco: Never Used  Substance and Sexual Activity  . Alcohol use: No    Comment: occ  . Drug use: No    Frequency: 1.0 times per week    Types: Marijuana  . Sexual activity: Yes    Birth control/protection: None  Other Topics Concern  . Not on file  Social History Narrative  . Not on file   Social Determinants of Health   Financial Resource Strain:   . Difficulty of Paying Living Expenses:   Food Insecurity:   . Worried About Charity fundraiser in the Last Year:   . Arboriculturist in the Last Year:   Transportation Needs:   . Film/video editor (Medical):   Marland Kitchen Lack of Transportation (Non-Medical):   Physical Activity:   . Days of Exercise per Week:   . Minutes of Exercise per Session:   Stress:   . Feeling of Stress :   Social Connections:   . Frequency of  Communication with Friends and Family:   . Frequency of Social Gatherings with Friends and Family:   . Attends Religious Services:   . Active Member of Clubs or Organizations:   . Attends Archivist Meetings:   Marland Kitchen Marital Status:   Intimate Partner Violence:   . Fear of Current or Ex-Partner:   . Emotionally Abused:   Marland Kitchen Physically Abused:   . Sexually Abused:     Past Surgical History:  Procedure Laterality Date  . BUNIONECTOMY      Family History  Problem Relation Age of Onset  . Mental illness Mother        anxiety and depression  . Hypertension Mother   . Kidney disease Mother   . Anemia Mother   . Cataracts Father   . Asthma Brother   . Diabetes Maternal Aunt   . Cancer Maternal Aunt        breast  . Diabetes Maternal Uncle   . Arthritis Maternal Grandmother   . Stroke Maternal Grandmother   . Hypertension Maternal Grandmother   . Arthritis Maternal Grandfather   . Hypertension Maternal Grandfather   . Stroke Maternal  Grandfather   . Arthritis Paternal Grandfather   . Stroke Paternal Grandfather     No Known Allergies  Current Outpatient Medications on File Prior to Visit  Medication Sig Dispense Refill  . [DISCONTINUED] citalopram (CELEXA) 20 MG tablet Take 1 tablet (20 mg total) by mouth daily. 30 tablet 12   No current facility-administered medications on file prior to visit.    BP 134/76 (BP Location: Right Arm, Patient Position: Sitting, Cuff Size: Small)   Pulse 72   Temp (!) 97.5 F (36.4 C) (Temporal)   Resp 16   Ht 5\' 3"  (1.6 m)   Wt 151 lb (68.5 kg)   SpO2 100%   BMI 26.75 kg/m       Objective:   Physical Exam Constitutional:      Appearance: She is well-developed.  Psychiatric:        Behavior: Behavior normal.        Thought Content: Thought content normal.        Judgment: Judgment normal.   Breast: bilateral breast exam is normal. No obvious masses.  Unable to express any nipple discharge on right.  Right lateral  breast is tender to palpation without any erythema         Assessment & Plan:  Nipple discharge- will refer for US of the right breast for further evaluation. She is advised to call if increased pain, if she develops redness of the breast or fever.   This visit occurred during the SARS-CoV-2 public health emergency.  Safety protocols were in place, including screening questions prior to the visit, additional usage of staff PPE, and extensive cleaning of exam room while observing appropriate contact time as indicated for disinfecting solutions.

## 2019-06-26 ENCOUNTER — Other Ambulatory Visit: Payer: Self-pay | Admitting: Family Medicine

## 2019-06-26 DIAGNOSIS — E041 Nontoxic single thyroid nodule: Secondary | ICD-10-CM

## 2019-07-05 ENCOUNTER — Other Ambulatory Visit: Payer: Self-pay

## 2019-07-05 ENCOUNTER — Ambulatory Visit (HOSPITAL_BASED_OUTPATIENT_CLINIC_OR_DEPARTMENT_OTHER): Payer: Managed Care, Other (non HMO) | Admitting: Family Medicine

## 2019-07-05 ENCOUNTER — Ambulatory Visit
Admission: RE | Admit: 2019-07-05 | Discharge: 2019-07-05 | Disposition: A | Discharge status: Home / Self Care | Payer: Managed Care, Other (non HMO) | Source: Ambulatory Visit | Attending: Family | Admitting: Family

## 2019-07-05 ENCOUNTER — Other Ambulatory Visit (HOSPITAL_COMMUNITY)
Admission: RE | Admit: 2019-07-05 | Discharge: 2019-07-05 | Disposition: A | Discharge status: Home / Self Care | Payer: Managed Care, Other (non HMO) | Source: Ambulatory Visit | Attending: Family Medicine | Admitting: Family Medicine

## 2019-07-05 ENCOUNTER — Ambulatory Visit (HOSPITAL_BASED_OUTPATIENT_CLINIC_OR_DEPARTMENT_OTHER)
Admission: RE | Admit: 2019-07-05 | Discharge: 2019-07-05 | Disposition: A | Discharge status: Home / Self Care | Payer: Managed Care, Other (non HMO) | Source: Ambulatory Visit | Attending: Family Medicine | Admitting: Family Medicine

## 2019-07-05 ENCOUNTER — Encounter: Payer: Self-pay | Admitting: Family Medicine

## 2019-07-05 VITALS — BP 130/85 | HR 83 | Ht 63.0 in | Wt 155.0 lb

## 2019-07-05 DIAGNOSIS — Z01419 Encounter for gynecological examination (general) (routine) without abnormal findings: Secondary | ICD-10-CM | POA: Insufficient documentation

## 2019-07-05 DIAGNOSIS — N6452 Nipple discharge: Secondary | ICD-10-CM

## 2019-07-05 DIAGNOSIS — E041 Nontoxic single thyroid nodule: Secondary | ICD-10-CM | POA: Insufficient documentation

## 2019-07-05 NOTE — Progress Notes (Signed)
GYNECOLOGY ANNUAL PREVENTATIVE CARE ENCOUNTER NOTE  Subjective:   Megan Webb is a 24 y.o. G69P2002 female here for a routine annual gynecologic exam.  Current complaints: none.   Denies abnormal vaginal bleeding, discharge, pelvic pain, problems with intercourse or other gynecologic concerns.    Gynecologic History Patient's last menstrual period was 06/27/2019. Patient is sexually active  Contraception: condoms Last Pap: 2018. Results were: normal Last mammogram: n/a.  Obstetric History OB History  Gravida Para Term Preterm AB Living  2 2 2  0 0 2  SAB TAB Ectopic Multiple Live Births  0 0 0 0 2    # Outcome Date GA Lbr Len/2nd Weight Sex Delivery Anes PTL Lv  2 Term 03/10/18 [redacted]w[redacted]d 03:36 / 00:17 8 lb 5.3 oz (3.779 kg) F Vag-Spont EPI  LIV     Birth Comments: moulding  1 Term 03/12/15 [redacted]w[redacted]d 04:09 / 00:13 7 lb 4.6 oz (3.305 kg) F Vag-Spont EPI, Local  LIV    Past Medical History:  Diagnosis Date  . Anemia    on Iron supplements  . Anxiety    in the past; not current  . Depression    in the past; not current  . Headache    has migraines, takes Extra Strength Tylenol with minimal relief  . Hyperthyroidism    nodule on thyroid; but not diagnosed with hyperthyroidism  . Umbilical hernia [id number]    Past Surgical History:  Procedure Laterality Date  . BUNIONECTOMY      Current Outpatient Medications on File Prior to Visit  Medication Sig Dispense Refill  . [DISCONTINUED] citalopram (CELEXA) 20 MG tablet Take 1 tablet (20 mg total) by mouth daily. 30 tablet 12   No current facility-administered medications on file prior to visit.    No Known Allergies  Social History   Socioeconomic History  . Marital status: Married    Spouse name: Not on file  . Number of children: 1  . Years of education: Not on file  . Highest education level: Not on file  Occupational History  . Not on file  Tobacco Use  . Smoking status: Never Smoker  . Smokeless tobacco:  Never Used  Vaping Use  . Vaping Use: Never used  Substance and Sexual Activity  . Alcohol use: No    Comment: occ  . Drug use: No    Frequency: 1.0 times per week    Types: Marijuana  . Sexual activity: Yes    Birth control/protection: None  Other Topics Concern  . Not on file  Social History Narrative  . Not on file   Social Determinants of Health   Financial Resource Strain:   . Difficulty of Paying Living Expenses:   Food Insecurity:   . Worried About Charity fundraiser in the Last Year:   . Arboriculturist in the Last Year:   Transportation Needs:   . Film/video editor (Medical):   Marland Kitchen Lack of Transportation (Non-Medical):   Physical Activity:   . Days of Exercise per Week:   . Minutes of Exercise per Session:   Stress:   . Feeling of Stress :   Social Connections:   . Frequency of Communication with Friends and Family:   . Frequency of Social Gatherings with Friends and Family:   . Attends Religious Services:   . Active Member of Clubs or Organizations:   . Attends Archivist Meetings:   Marland Kitchen Marital Status:   Intimate Partner Violence:   .  Fear of Current or Ex-Partner:   . Emotionally Abused:   Marland Kitchen Physically Abused:   . Sexually Abused:     Family History  Problem Relation Age of Onset  . Mental illness Mother        anxiety and depression  . Hypertension Mother   . Kidney disease Mother   . Anemia Mother   . Cataracts Father   . Asthma Brother   . Diabetes Maternal Aunt   . Cancer Maternal Aunt        breast  . Diabetes Maternal Uncle   . Arthritis Maternal Grandmother   . Stroke Maternal Grandmother   . Hypertension Maternal Grandmother   . Arthritis Maternal Grandfather   . Hypertension Maternal Grandfather   . Stroke Maternal Grandfather   . Arthritis Paternal Grandfather   . Stroke Paternal Grandfather     The following portions of the patient's history were reviewed and updated as appropriate: allergies, current medications,  past family history, past medical history, past social history, past surgical history and problem list.  Review of Systems Pertinent items are noted in HPI.   Objective:  BP 130/85   Pulse 83   Ht 5\' 3"  (1.6 m)   Wt 155 lb (70.3 kg)   LMP 06/27/2019   BMI 27.46 kg/m  Wt Readings from Last 3 Encounters:  07/05/19 155 lb (70.3 kg)  06/19/19 151 lb (68.5 kg)  04/13/19 156 lb 4 oz (70.9 kg)     Chaperone present during exam  CONSTITUTIONAL: Well-developed, well-nourished female in no acute distress.  HENT:  Normocephalic, atraumatic, External right and left ear normal. Oropharynx is clear and moist EYES: Conjunctivae and EOM are normal. Pupils are equal, round, and reactive to light. No scleral icterus.  NECK: Normal range of motion, supple, no masses.  Left thyroid nodule  CARDIOVASCULAR: Normal heart rate noted, regular rhythm RESPIRATORY: Clear to auscultation bilaterally. Effort and breath sounds normal, no problems with respiration noted. BREASTS: Symmetric in size. No masses, skin changes, nipple drainage, or lymphadenopathy. ABDOMEN: Soft, normal bowel sounds, no distention noted.  No tenderness, rebound or guarding.  PELVIC: Normal appearing external genitalia; normal appearing vaginal mucosa and cervix.  No abnormal discharge noted.  Normal uterine size, no other palpable masses, no uterine or adnexal tenderness. MUSCULOSKELETAL: Normal range of motion. No tenderness.  No cyanosis, clubbing, or edema.  2+ distal pulses. SKIN: Skin is warm and dry. No rash noted. Not diaphoretic. No erythema. No pallor. NEUROLOGIC: Alert and oriented to person, place, and time. Normal reflexes, muscle tone coordination. No cranial nerve deficit noted. PSYCHIATRIC: Normal mood and affect. Normal behavior. Normal judgment and thought content.  Assessment:  Annual gynecologic examination with pap smear   Plan:  1. Well Woman Exam Will follow up results of pap smear and manage  accordingly. Pap today.   Routine preventative health maintenance measures emphasized. Please refer to After Visit Summary for other counseling recommendations.    Loma Boston, [place] for [employer]

## 2019-07-09 LAB — CYTOLOGY - PAP: Diagnosis: NEGATIVE

## 2019-07-10 ENCOUNTER — Other Ambulatory Visit: Payer: Self-pay | Admitting: Otolaryngology

## 2019-07-10 DIAGNOSIS — E041 Nontoxic single thyroid nodule: Secondary | ICD-10-CM

## 2019-07-14 ENCOUNTER — Telehealth: Payer: Managed Care, Other (non HMO) | Admitting: Physician Assistant

## 2019-07-14 DIAGNOSIS — N760 Acute vaginitis: Secondary | ICD-10-CM | POA: Diagnosis not present

## 2019-07-14 MED ORDER — FLUCONAZOLE 150 MG PO TABS: 150.0000 mg | ORAL_TABLET | Freq: Once | ORAL | 0 refills | Status: AC

## 2019-07-14 NOTE — Progress Notes (Signed)
We are sorry that you are not feeling well. Here is how we plan to help! Based on what you shared with me it looks like you: May have a yeast vaginosis  Vaginosis is an inflammation of the vagina that can result in discharge, itching and pain. The cause is usually a change in the normal balance of vaginal bacteria or an infection. Vaginosis can also result from reduced estrogen levels after menopause.  The most common causes of vaginosis are:   Bacterial vaginosis which results from an overgrowth of one on several organisms that are normally present in your vagina.   Yeast infections which are caused by a naturally occurring fungus called candida.   Vaginal atrophy (atrophic vaginosis) which results from the thinning of the vagina from reduced estrogen levels after menopause.   Trichomoniasis which is caused by a parasite and is commonly transmitted by sexual intercourse.  Factors that increase your risk of developing vaginosis include: . Medications, such as antibiotics and steroids . Uncontrolled diabetes . Use of hygiene products such as bubble bath, vaginal spray or vaginal deodorant . Douching . Wearing damp or tight-fitting clothing . Using an intrauterine device (IUD) for birth control . Hormonal changes, such as those associated with pregnancy, birth control pills or menopause . Sexual activity . Having a sexually transmitted infection  Your treatment plan is A single Diflucan (fluconazole) 150mg tablet once.  I have electronically sent this prescription into the pharmacy that you have chosen.  Be sure to take all of the medication as directed. Stop taking any medication if you develop a rash, tongue swelling or shortness of breath. Mothers who are breast feeding should consider pumping and discarding their breast milk while on these antibiotics. However, there is no consensus that infant exposure at these doses would be harmful.  Remember that medication creams can weaken latex  condoms. .   HOME CARE:  Good hygiene may prevent some types of vaginosis from recurring and may relieve some symptoms:  . Avoid baths, hot tubs and whirlpool spas. Rinse soap from your outer genital area after a shower, and dry the area well to prevent irritation. Don't use scented or harsh soaps, such as those with deodorant or antibacterial action. . Avoid irritants. These include scented tampons and pads. . Wipe from front to back after using the toilet. Doing so avoids spreading fecal bacteria to your vagina.  Other things that may help prevent vaginosis include:  . Don't douche. Your vagina doesn't require cleansing other than normal bathing. Repetitive douching disrupts the normal organisms that reside in the vagina and can actually increase your risk of vaginal infection. Douching won't clear up a vaginal infection. . Use a latex condom. Both female and female latex condoms may help you avoid infections spread by sexual contact. . Wear cotton underwear. Also wear pantyhose with a cotton crotch. If you feel comfortable without it, skip wearing underwear to bed. Yeast thrives in moist environments Your symptoms should improve in the next day or two.  GET HELP RIGHT AWAY IF:  . You have pain in your lower abdomen ( pelvic area or over your ovaries) . You develop nausea or vomiting . You develop a fever . Your discharge changes or worsens . You have persistent pain with intercourse . You develop shortness of breath, a rapid pulse, or you faint.  These symptoms could be signs of problems or infections that need to be evaluated by a medical provider now.  MAKE SURE YOU      Understand these instructions.  Will watch your condition.  Will get help right away if you are not doing well or get worse.  Your e-visit answers were reviewed by a board certified advanced clinical practitioner to complete your personal care plan. Depending upon the condition, your plan could have included  both over the counter or prescription medications. Please review your pharmacy choice to make sure that you have choses a pharmacy that is open for you to pick up any needed prescription, Your safety is important to us. If you have drug allergies check your prescription carefully.   You can use MyChart to ask questions about today's visit, request a non-urgent call back, or ask for a work or school excuse for 24 hours related to this e-Visit. If it has been greater than 24 hours you will need to follow up with your provider, or enter a new e-Visit to address those concerns. You will get a MyChart message within the next two days asking about your experience. I hope that your e-visit has been valuable and will speed your recovery.  Greater than 5 minutes, yet less than 10 minutes of time have been spent researching, coordinating and implementing care for this patient today.   

## 2019-07-31 ENCOUNTER — Telehealth: Payer: Self-pay

## 2019-07-31 ENCOUNTER — Telehealth: Payer: Self-pay | Admitting: Family Medicine

## 2019-07-31 NOTE — Telephone Encounter (Signed)
Called left message to call back 

## 2019-07-31 NOTE — Telephone Encounter (Signed)
Nurse Assessment Nurse: Susy Manor, RN, Megan Date/Time (Eastern Time): 07/30/2019 7:23:53 AM Confirm and document reason for call. If symptomatic, describe symptoms. ---Caller states she had a tele health visit about a week ago for vaginal yeast symptoms. She was prescribed Diflucan. Patient is continuing to have vaginal yeast symptoms. Denies fever. Has the patient had close contact with a person known or suspected to have the novel coronavirus illness OR traveled / lives in area with major community spread (including international travel) in the last 14 days from the onset of symptoms? * If Asymptomatic, screen for exposure and travel within the last 14 days. ---No Does the patient have any new or worsening symptoms? ---Yes Will a triage be completed? ---Yes Related visit to physician within the last 2 weeks? ---Yes Does the PT have any chronic conditions? (i.e. diabetes, asthma, this includes High risk factors for pregnancy, etc.) ---No Is the patient pregnant or possibly pregnant? (Ask all females between the ages of 76-55) ---No Is this a behavioral health or substance abuse call? ---No Nurse: Susy Manor, RN, Megan Date/Time (Eastern Time): 07/30/2019 7:26:50 AM Please select the assessment type ---Standing order Other current medications? ---Yes List current medications. ---pro biotic Medication allergies? ---NoPLEASE NOTE: All timestamps contained within this report are represented as [place] Standard Time. CONFIDENTIALTY NOTICE: This fax transmission is intended only for the addressee. It contains information that is legally privileged, confidential or otherwise protected from use or disclosure. If you are not the intended recipient, you are strictly prohibited from reviewing, disclosing, copying using or disseminating any of this information or taking any action in reliance on or regarding this information. If you have received this fax in error, please notify us immediately by telephone  so that we can arrange for its return to [place]. Phone: [phone], Toll-Free: [phone], Fax: [phone] Page: 2 of 3 Call Id: [id number] Nurse Assessment Pharmacy name and phone number. ---[facility] @ [phone] Guidelines Guideline Title Affirmed Question Affirmed Notes Nurse Date/Time Eilene Ghazi Time) Vaginal Symptoms MODERATE-SEVERE itching (i.e., interferes with school, work, or sleep) Susy Manor, RN, Megan 07/30/2019 7:25:33 AM Disp. Time Eilene Ghazi Time) Disposition Final User 07/30/2019 7:34:18 AM Pharmacy Call Susy Manor, RN, [place] Reason: I called requested pharmacy and left medication order on voice mail ( pharmacy currently closed and should be open at [place]). Patient notified. 07/30/2019 7:26:43 AM See PCP within 24 Hours Yes Susy Manor, RN, Jinny Blossom Caller Disagree/Comply Comply Caller Understands Yes PreDisposition Call Doctor Care Advice Given Per Guideline SEE PCP WITHIN 24 HOURS: DO NOT: * DO NOT DOUCHE (Reason: douching does not help vaginitis, and may actually make it worse). * DO NOT USE ANY VAGINAL CREAMS during the 24 hours before your physician appointment (Reason: interferes with examination). CALL BACK IF: * Fever or severe pain * You become worse. CARE ADVICE given per Vaginal Symptoms (Adult) guideline. Standing Orders Preparation Additional Instructions Route Frequency Duration Nurse Comments User Name Diflucan 150 mg 1 tablet Times 1 dose only Oral Susy Manor, RN, Megan Referrals REFERRED TO PCP OFFICE

## 2019-07-31 NOTE — Telephone Encounter (Signed)
Could contact them, probably would need to be seen in person to leave a specimen sample with [place] or see the GYN team. Ty.

## 2019-07-31 NOTE — Telephone Encounter (Signed)
Patient informed by front staff of PCP instructions

## 2019-07-31 NOTE — Telephone Encounter (Signed)
Patient told to follow up GYN team per Pcp request

## 2019-08-01 ENCOUNTER — Ambulatory Visit (INDEPENDENT_AMBULATORY_CARE_PROVIDER_SITE_OTHER): Payer: Managed Care, Other (non HMO)

## 2019-08-01 ENCOUNTER — Other Ambulatory Visit: Payer: Self-pay

## 2019-08-01 ENCOUNTER — Ambulatory Visit: Payer: Managed Care, Other (non HMO)

## 2019-08-01 ENCOUNTER — Other Ambulatory Visit (HOSPITAL_COMMUNITY)
Admission: RE | Admit: 2019-08-01 | Discharge: 2019-08-01 | Disposition: A | Discharge status: Home / Self Care | Payer: Managed Care, Other (non HMO) | Source: Ambulatory Visit | Attending: Obstetrics & Gynecology | Admitting: Obstetrics & Gynecology

## 2019-08-01 VITALS — BP 119/80 | HR 64 | Wt 158.0 lb

## 2019-08-01 DIAGNOSIS — N898 Other specified noninflammatory disorders of vagina: Secondary | ICD-10-CM | POA: Insufficient documentation

## 2019-08-01 NOTE — Progress Notes (Addendum)
Pt presents with vaginal discharge and irritation x 2 weeks. Pt states she was prescribed Diflucan on 07/14/19,but it did not provide any relief. Pt requests STI screening but denies having any new partners. Self swab was sent to the lab.  Ravon Mortellaro l Alfie Rideaux, CMA   Attestation of Attending Supervision of CMA/RN: Evaluation and management procedures were performed by the nurse under my supervision and collaboration.  I have reviewed the nursing note and chart, and I agree with the management and plan.  Carolyn L. Harraway-Smith, M.D., Cherlynn June

## 2019-08-02 LAB — CERVICOVAGINAL ANCILLARY ONLY
Bacterial Vaginitis (gardnerella): NEGATIVE
Candida Glabrata: NEGATIVE
Candida Vaginitis: POSITIVE — AB
Chlamydia: NEGATIVE
Comment: NEGATIVE
Comment: NEGATIVE
Comment: NEGATIVE
Comment: NEGATIVE
Comment: NEGATIVE
Comment: NORMAL
Neisseria Gonorrhea: NEGATIVE
Trichomonas: NEGATIVE

## 2019-08-03 ENCOUNTER — Other Ambulatory Visit: Payer: Self-pay

## 2019-08-03 DIAGNOSIS — B379 Candidiasis, unspecified: Secondary | ICD-10-CM

## 2019-08-03 MED ORDER — FLUCONAZOLE 150 MG PO TABS: ORAL_TABLET | ORAL | 1 refills | Status: DC

## 2019-08-03 NOTE — Progress Notes (Signed)
Pt tested positive for yeast. Diflucan 150 mg was sent to the pt's pharmacy.  Megan Webb l Megan Webb, CMA

## 2019-08-09 ENCOUNTER — Other Ambulatory Visit: Payer: Self-pay | Admitting: Obstetrics & Gynecology

## 2019-09-17 IMAGING — US US MFM OB FOLLOW-UP
1 series · 14 of 28 positions shown · non-contrast
Comparison: none

[Series 1: us mfm ob follow-up · 14 of 42 slices shown]
[im 2/42]
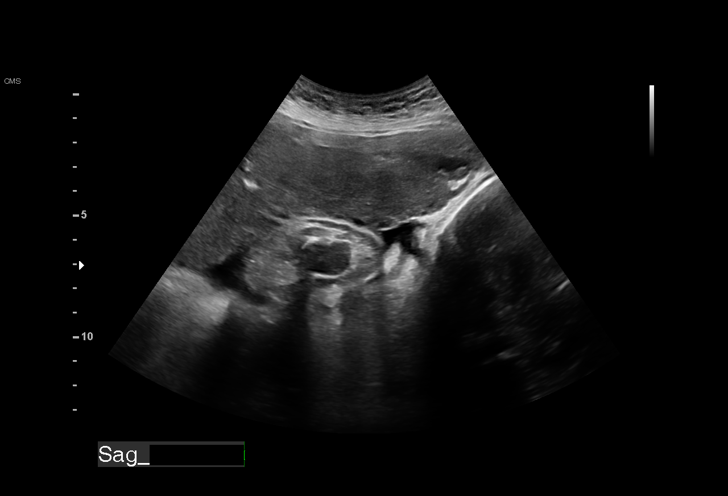
[im 5/42]
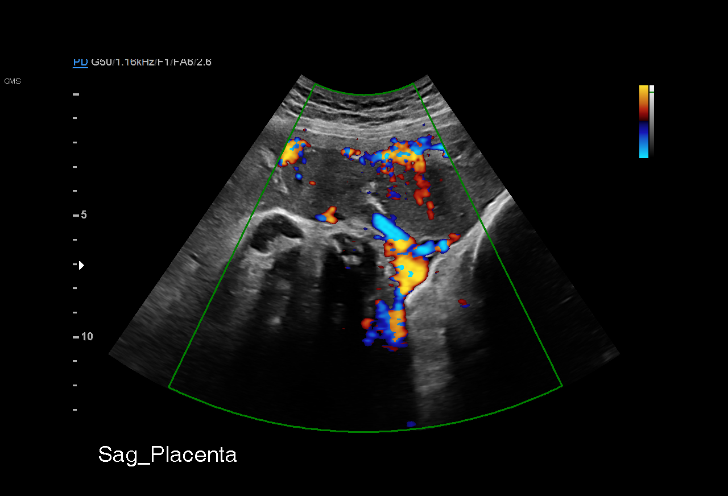
[im 8/42]
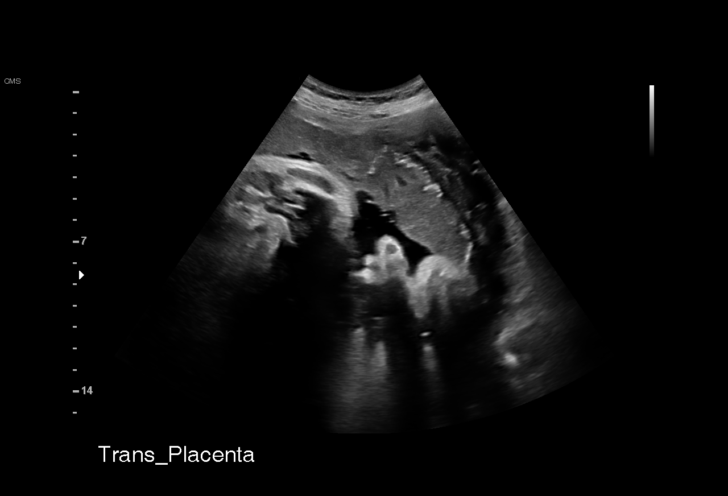
[im 11/42]
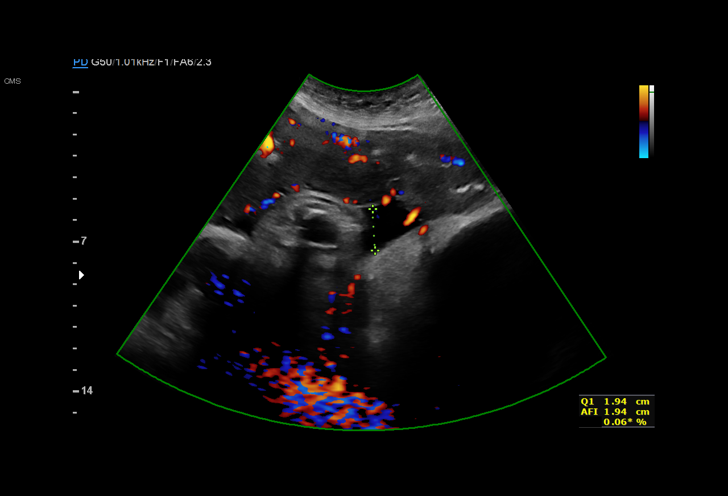
[im 14/42]
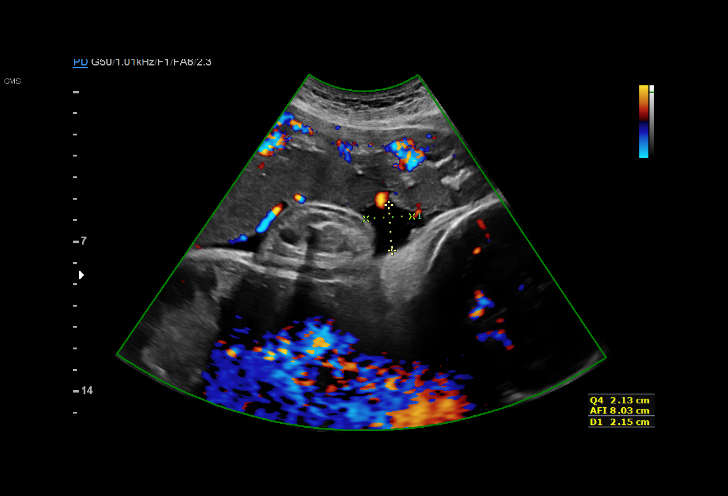
[im 17/42]
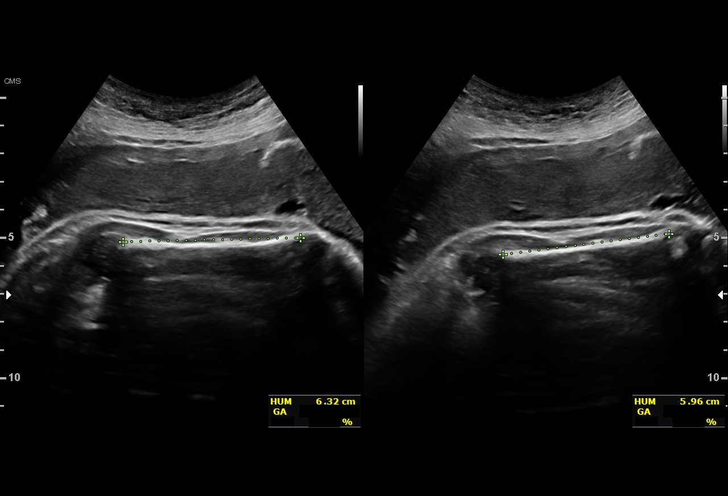
[im 20/42]
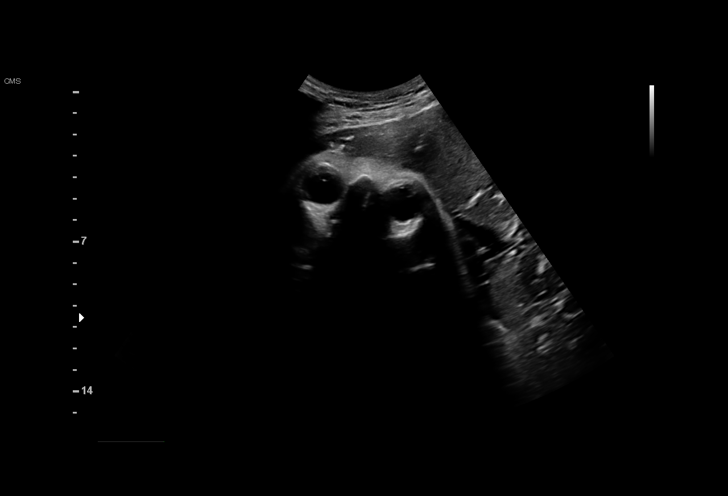
[im 23/42]
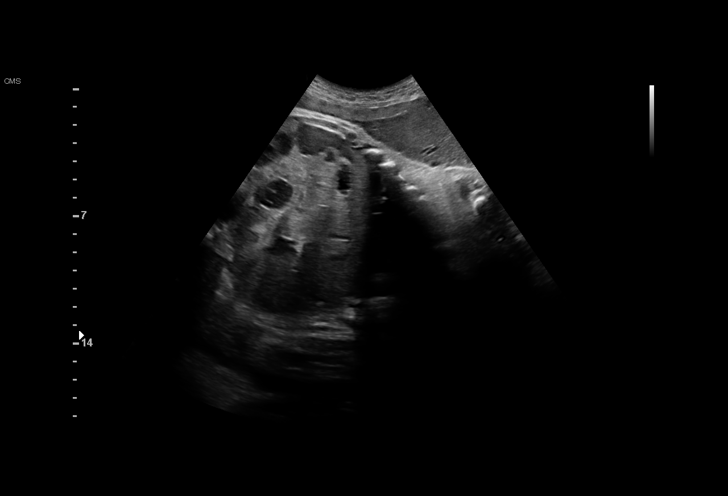
[im 26/42]
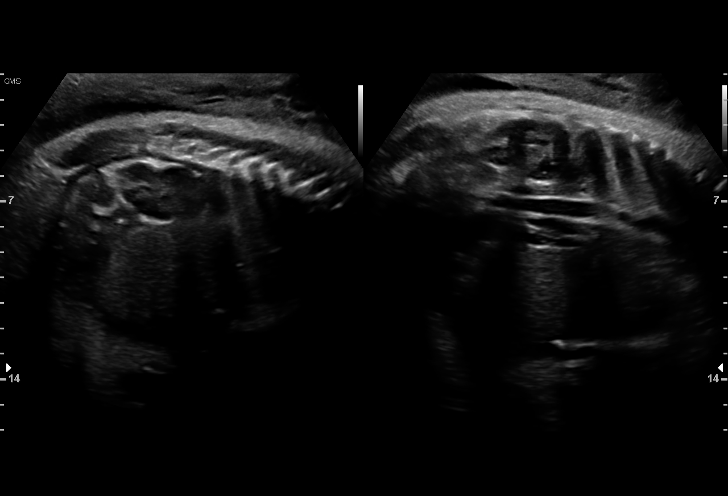
[im 29/42]
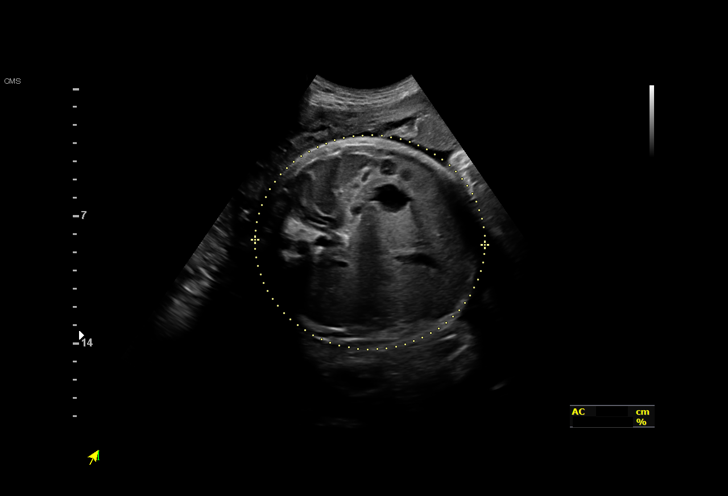
[im 32/42]
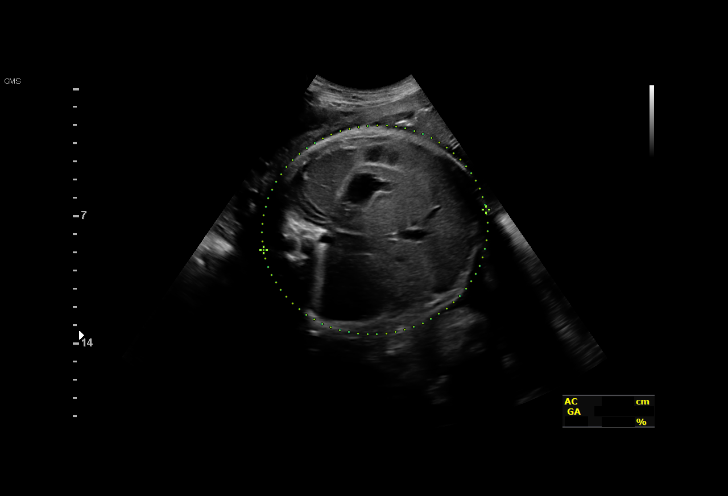
[im 35/42]
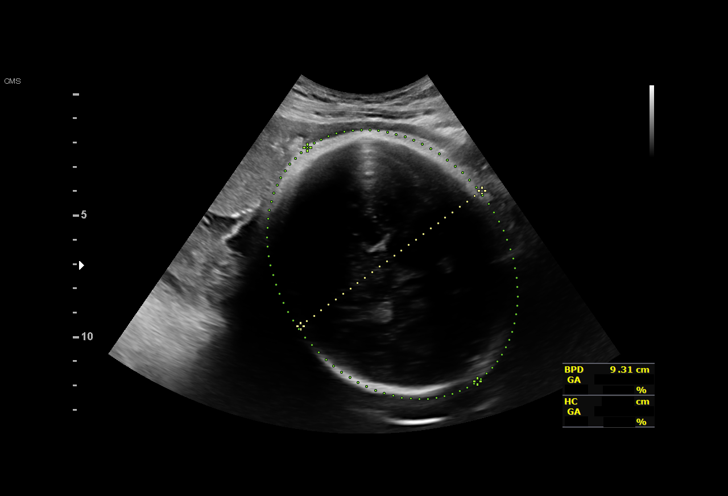
[im 38/42]
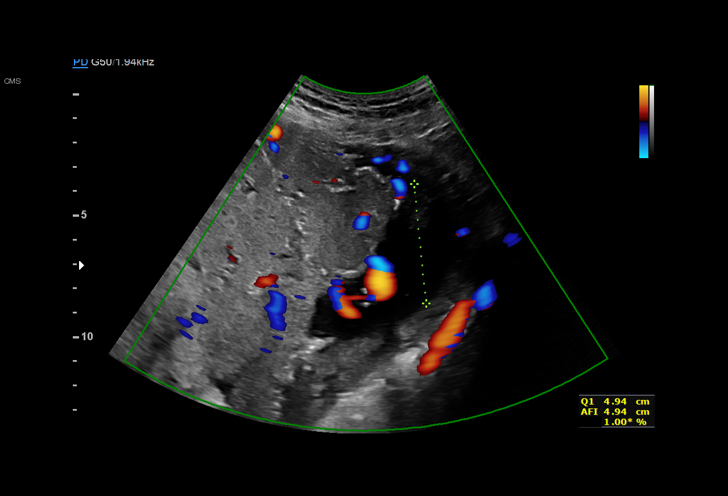
[im 42/42]
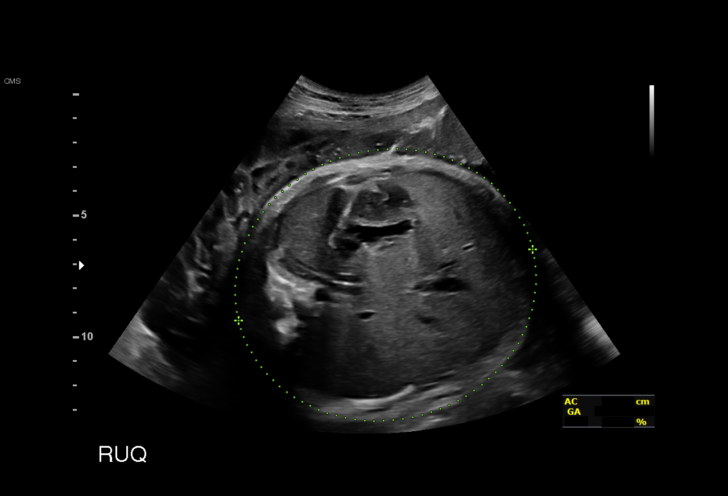

[14 of 28 positions shown; findings below may reference images not displayed]

[REDACTED]

                    Healthcare - [REDACTED]

                                                        SHAUNTE
 ----------------------------------------------------------------------

 ----------------------------------------------------------------------
Indications

  Encounter for other antenatal screening
  follow-up
  Uterine size-date discrepancy, third trimester
  (S>D)
  39 weeks gestation of pregnancy
 ----------------------------------------------------------------------
Vital Signs

                                                 Height:        5'3"
Fetal Evaluation

 Num Of Fetuses:          1
 Fetal Heart Rate(bpm):   145
 Cardiac Activity:        Observed
 Presentation:            Cephalic
 Placenta:                Anterior
 P. Cord Insertion:       Visualized, central

 Amniotic Fluid
 AFI FV:      Within normal limits

 AFI Sum(cm)     %Tile       Largest Pocket(cm)
 8.07            13

 RUQ(cm)       RLQ(cm)        LUQ(cm)

Biometry

 BPD:      93.2   mm     G. Age:  37w 6d         42  %    CI:         77.35  %    70 - 86
                                                          FL/HC:       22.3  %    20.7 -
 HC:      335.5   mm     G. Age:  38w 3d         18  %    HC/AC:       0.89       0.87 -
 AC:      375.5   mm     G. Age:  41w 3d       > 97  %    FL/BPD:      80.2  %    71 - 87
 FL:       74.7   mm     G. Age:  38w 2d         24  %    FL/AC:       19.9  %    20 - 24
 HUM:      62.1   mm     G. Age:  36w 0d          9  %

 Est. FW:    3043   gm    8 lb 11 oz    > 90  %
OB History

 Gravidity:     2         Term:  1          Prem:  0        SAB:   0
 TOP:           0       Ectopic: 0         Living: 1
Gestational Age

 LMP:            42w 5d       Date:  05/13/17                   EDD:  02/17/18
 U/S Today:      39w 0d                                         EDD:  03/15/18
 Best:           39w 5d    Det. By:  Early Ultrasound           EDD:  03/10/18
                                     (08/04/17)
Anatomy

 Cranium:                Appears normal         Aortic Arch:            Previously seen
 Cavum:                  Appears normal         Ductal Arch:            Previously seen
 Ventricles:             Previously seen        Diaphragm:              Appears normal
 Choroid Plexus:         Previously seen        Stomach:                Appears normal, left
                                                                        sided
 Cerebellum:             Previously seen        Abdomen:                Appears normal
 Posterior Fossa:        Previously seen        Abdominal Wall:         Previously seen
 Nuchal Fold:            Previously seen        Cord Vessels:           Previously seen
 Face:                   Orbits and profile     Kidneys:                Appear normal
                         previously seen
 Lips:                   Previously seen        Bladder:                Appears normal
 Thoracic:               Appears normal         Spine:                  Previously seen
 Heart:                  Appears normal         Upper Extremities:      Previously seen
                         (4CH, axis, and situs)
 RVOT:                   Previously seen        Lower Extremities:      Previously seen
 LVOT:                   Previously seen

 Other:   Heels and 5th digit previously visualized. Nasal bone previously
          visualized. Open hands previously visualized.
Impression

 Normal interval growth.
 Amniotic fluid subjectively low
Recommendations

 Scheduled for IOL on 02/07/18

## 2019-10-14 ENCOUNTER — Telehealth: Payer: Medicaid Other | Admitting: Physician Assistant

## 2019-10-14 DIAGNOSIS — B379 Candidiasis, unspecified: Secondary | ICD-10-CM

## 2019-10-14 DIAGNOSIS — N760 Acute vaginitis: Secondary | ICD-10-CM | POA: Diagnosis not present

## 2019-10-14 MED ORDER — FLUCONAZOLE 150 MG PO TABS: ORAL_TABLET | ORAL | 0 refills | Status: DC

## 2019-10-14 NOTE — Progress Notes (Signed)
E-Visit for Vaginitis  We are sorry that you are not feeling well. Here is how we plan to help! Based on what you shared with me it looks like you: May have a yeast vaginosis  Vaginosis is an inflammation of the vagina that can result in discharge, itching and pain. The cause is usually a change in the normal balance of vaginal bacteria or an infection. Vaginosis can also result from reduced estrogen levels after menopause.  The most common causes of vaginosis are:   Bacterial vaginosis which results from an overgrowth of one on several organisms that are normally present in your vagina.   Yeast infections which are caused by a naturally occurring fungus called candida.   Vaginal atrophy (atrophic vaginosis) which results from the thinning of the vagina from reduced estrogen levels after menopause.   Trichomoniasis which is caused by a parasite and is commonly transmitted by sexual intercourse.  Factors that increase your risk of developing vaginosis include: Marland Kitchen Medications, such as antibiotics and steroids . Uncontrolled diabetes . Use of hygiene products such as bubble bath, vaginal spray or vaginal deodorant . Douching . Wearing damp or tight-fitting clothing . Using an intrauterine device (IUD) for birth control . Hormonal changes, such as those associated with pregnancy, birth control pills or menopause . Sexual activity . Having a sexually transmitted infection  Your treatment plan is A single Diflucan (fluconazole) 150mg  tablet once.  I have electronically sent this prescription into the pharmacy that you have chosen.  Be sure to take all of the medication as directed. Stop taking any medication if you develop a rash, tongue swelling or shortness of breath. Mothers who are breast feeding should consider pumping and discarding their breast milk while on these antibiotics. However, there is no consensus that infant exposure at these doses would be harmful.  Remember that medication  creams can weaken latex condoms.   HOME CARE: Good hygiene may prevent some types of vaginosis from recurring and may relieve some symptoms:  . Avoid baths, hot tubs and whirlpool spas. Rinse soap from your outer genital area after a shower, and dry the area well to prevent irritation. Don't use scented or harsh soaps, such as those with deodorant or antibacterial action. Marland Kitchen Avoid irritants. These include scented tampons and pads. . Wipe from front to back after using the toilet. Doing so avoids spreading fecal bacteria to your vagina.  Other things that may help prevent vaginosis include:  Marland Kitchen Don't douche. Your vagina doesn't require cleansing other than normal bathing. Repetitive douching disrupts the normal organisms that reside in the vagina and can actually increase your risk of vaginal infection. Douching won't clear up a vaginal infection. . Use a latex condom. Both female and female latex condoms may help you avoid infections spread by sexual contact. . Wear cotton underwear. Also wear pantyhose with a cotton crotch. If you feel comfortable without it, skip wearing underwear to bed. Yeast thrives in [employer] Your symptoms should improve in the next day or two.  GET HELP RIGHT AWAY IF:  . You have pain in your lower abdomen ( pelvic area or over your ovaries) . You develop nausea or vomiting . You develop a fever . Your discharge changes or worsens . You have persistent pain with intercourse . You develop shortness of breath, a rapid pulse, or you faint.  These symptoms could be signs of problems or infections that need to be evaluated by a medical provider now.  MAKE SURE YOU  Understand these instructions.  Will watch your condition.  Will get help right away if you are not doing well or get worse.  Your e-visit answers were reviewed by a board certified advanced clinical practitioner to complete your personal care plan. Depending upon the condition, your plan  could have included both over the counter or prescription medications. Please review your pharmacy choice to make sure that you have choses a pharmacy that is open for you to pick up any needed prescription, Your safety is important to [place]. If you have drug allergies check your prescription carefully.   You can use MyChart to ask questions about today's visit, request a non-urgent call back, or ask for a work or school excuse for 24 hours related to this e-Visit. If it has been greater than 24 hours you will need to follow up with your provider, or enter a new e-Visit to address those concerns. You will get a MyChart message within the next two days asking about your experience. I hope that your e-visit has been valuable and will speed your recovery.  Greater than 5 minutes, yet less than 10 minutes of time have been spent researching, coordinating, and implementing care for this patient today.  Inda Coke PA-C

## 2019-11-16 ENCOUNTER — Other Ambulatory Visit: Payer: Self-pay

## 2019-11-16 DIAGNOSIS — B379 Candidiasis, unspecified: Secondary | ICD-10-CM

## 2019-11-16 MED ORDER — FLUCONAZOLE 150 MG PO TABS: ORAL_TABLET | ORAL | 0 refills | Status: DC

## 2019-11-30 ENCOUNTER — Ambulatory Visit: Payer: Medicaid Other | Admitting: Family Medicine

## 2019-12-26 ENCOUNTER — Other Ambulatory Visit (HOSPITAL_COMMUNITY)
Admission: RE | Admit: 2019-12-26 | Discharge: 2019-12-26 | Disposition: A | Discharge status: Home / Self Care | Payer: BC Managed Care – PPO | Source: Ambulatory Visit | Attending: Family Medicine | Admitting: Family Medicine

## 2019-12-26 ENCOUNTER — Other Ambulatory Visit: Payer: Medicaid Other

## 2019-12-26 ENCOUNTER — Other Ambulatory Visit: Payer: Self-pay

## 2019-12-26 VITALS — BP 123/82 | HR 73 | Ht 63.0 in | Wt 159.0 lb

## 2019-12-26 DIAGNOSIS — Z113 Encounter for screening for infections with a predominantly sexual mode of transmission: Secondary | ICD-10-CM | POA: Insufficient documentation

## 2019-12-26 LAB — CERVICOVAGINAL ANCILLARY ONLY
Bacterial Vaginitis (gardnerella): NEGATIVE
Candida Glabrata: NEGATIVE
Candida Vaginitis: POSITIVE — AB
Chlamydia: NEGATIVE
Comment: NEGATIVE
Comment: NEGATIVE
Comment: NEGATIVE
Comment: NEGATIVE
Comment: NEGATIVE
Comment: NORMAL
Neisseria Gonorrhea: NEGATIVE
Trichomonas: NEGATIVE

## 2019-12-26 NOTE — Progress Notes (Signed)
Chart reviewed - agree with CMA/RN documentation.  ° °

## 2019-12-26 NOTE — Progress Notes (Unsigned)
Patient requested full STD testing. Patient states her husband was unfaithful to her and wanted to be checked. Patient not reporting any symptoms. Kathrene Alu RN

## 2019-12-27 ENCOUNTER — Other Ambulatory Visit: Payer: Self-pay

## 2019-12-27 DIAGNOSIS — B379 Candidiasis, unspecified: Secondary | ICD-10-CM

## 2019-12-27 LAB — RPR: RPR Ser Ql: NONREACTIVE

## 2019-12-27 LAB — HEPATITIS B SURFACE ANTIGEN: Hepatitis B Surface Ag: NEGATIVE

## 2019-12-27 LAB — HEPATITIS C ANTIBODY: Hep C Virus Ab: <0.1 s/co ratio (ref 0.0–0.9)

## 2019-12-27 LAB — HIV ANTIBODY (ROUTINE TESTING W REFLEX): HIV Screen 4th Generation wRfx: NONREACTIVE

## 2019-12-27 MED ORDER — FLUCONAZOLE 150 MG PO TABS: ORAL_TABLET | ORAL | 0 refills | Status: DC

## 2020-01-26 NOTE — L&D Delivery Note (Signed)
[facility] Delivery Note  Megan Webb is a 25 y.o. G3P2002 s/p SVD at [redacted]w[redacted]d. She was admitted for SOL.   ROM: 1h 27m with meconium-stained fluid GBS Status: Negative Maximum Maternal Temperature: 98.2 F  Delivery Date/Time: 12/15/2020 at [phone] Delivery: Called to room and patient was complete and pushing. Head delivered. No nuchal cord present. Shoulder and body delivered in usual fashion. Infant with spontaneous cry, placed on mother's abdomen, dried and stimulated. Cord clamped x 2 after 1-minute delay, and cut by father of baby. Cord blood drawn. Placenta delivered spontaneously with gentle cord traction. Fundus firm with massage and Pitocin. Labia, perineum, vagina, and cervix inspected inspected with no lacerations.   Placenta: Intact' 3VC Complications: None Lacerations: None EBL:  Analgesia: Epidural   Infant: Viable  APGARs 8 & [place], D.O. [place], PGY-1 12/15/2020, 6:56 AM

## 2020-01-30 ENCOUNTER — Other Ambulatory Visit: Payer: Self-pay

## 2020-01-30 DIAGNOSIS — B379 Candidiasis, unspecified: Secondary | ICD-10-CM

## 2020-01-30 MED ORDER — FLUCONAZOLE 150 MG PO TABS: ORAL_TABLET | ORAL | 0 refills | Status: DC

## 2020-02-07 ENCOUNTER — Emergency Department (HOSPITAL_BASED_OUTPATIENT_CLINIC_OR_DEPARTMENT_OTHER): Payer: BC Managed Care – PPO

## 2020-02-07 ENCOUNTER — Encounter (HOSPITAL_BASED_OUTPATIENT_CLINIC_OR_DEPARTMENT_OTHER): Payer: Self-pay

## 2020-02-07 ENCOUNTER — Emergency Department (HOSPITAL_BASED_OUTPATIENT_CLINIC_OR_DEPARTMENT_OTHER)
Admission: EM | Admit: 2020-02-07 | Discharge: 2020-02-07 | Disposition: A | Discharge status: Home / Self Care | Payer: BC Managed Care – PPO | Attending: Emergency Medicine | Admitting: Emergency Medicine

## 2020-02-07 ENCOUNTER — Other Ambulatory Visit: Payer: Self-pay

## 2020-02-07 DIAGNOSIS — R079 Chest pain, unspecified: Secondary | ICD-10-CM

## 2020-02-07 DIAGNOSIS — Z20822 Contact with and (suspected) exposure to covid-19: Secondary | ICD-10-CM | POA: Insufficient documentation

## 2020-02-07 LAB — CBC
HCT: 42.4 % (ref 36.0–46.0)
Hemoglobin: 13.8 g/dL (ref 12.0–15.0)
MCH: 29.2 pg (ref 26.0–34.0)
MCHC: 32.5 g/dL (ref 30.0–36.0)
MCV: 89.6 fL (ref 80.0–100.0)
Platelets: 308 10*3/uL (ref 150–400)
RBC: 4.73 MIL/uL (ref 3.87–5.11)
RDW: 13 % (ref 11.5–15.5)
WBC: 5.9 10*3/uL (ref 4.0–10.5)
nRBC: 0 % (ref 0.0–0.2)

## 2020-02-07 LAB — BASIC METABOLIC PANEL
Anion gap: 13 (ref 5–15)
BUN: 13 mg/dL (ref 6–20)
CO2: 23 mmol/L (ref 22–32)
Calcium: 9.7 mg/dL (ref 8.9–10.3)
Chloride: 102 mmol/L (ref 98–111)
Creatinine, Ser: 0.71 mg/dL (ref 0.44–1.00)
GFR, Estimated: >60 mL/min (ref >60)
Glucose, Bld: 91 mg/dL (ref 70–99)
Potassium: 3.4 mmol/L — ABNORMAL LOW (ref 3.5–5.1)
Sodium: 138 mmol/L (ref 135–145)

## 2020-02-07 LAB — SARS CORONAVIRUS 2 (TAT 6-24 HRS): SARS Coronavirus 2: NEGATIVE

## 2020-02-07 LAB — TROPONIN I (HIGH SENSITIVITY): Troponin I (High Sensitivity): <2 ng/L (ref <18)

## 2020-02-07 LAB — PREGNANCY, URINE: Preg Test, Ur: NEGATIVE

## 2020-02-07 MED ORDER — HYDROXYZINE HCL 25 MG PO TABS: 25.0000 mg | ORAL_TABLET | Freq: Four times a day (QID) | ORAL | 0 refills | Status: DC

## 2020-02-07 NOTE — Discharge Instructions (Addendum)
Your work-up today was reassuring.  Please follow-up with your primary care doctor for reevaluation. Please return to ER for any new or concerning symptoms. Please take hydroxyzine as prescribed.  Please been plenty of water.  I recommend following with a therapist as this is very helpful.  Please read the attached information which includes phone numbers for regional therapists.  This also includes some information for drug rehab which you do not need to pay attention to.  You may also use Tylenol and ibuprofen for pain. Please use Tylenol or ibuprofen for pain.  You may use 600 mg ibuprofen every 6 hours or 1000 mg of Tylenol every 6 hours.  You may choose to alternate between the 2.  This would be most effective.  Not to exceed 4 g of Tylenol within 24 hours.  Not to exceed 3200 mg ibuprofen 24 hours.  Your COVID test is pending; you should expect results within 24 hours. You can access your results on your MyChart--if you test positive you should receive a phone call.  In the meantime follow CDC guidelines and quarantine, wear a mask, wash hands often.   Please take over the counter vitamin D 2000-4000 units per day. I also recommend zinc 50 mg per day for the next two weeks.   Please return to ED if you feel have difficulty breathing or have emergent, new or concerning symptoms.  Patients who have symptoms consistent with COVID-19 should self isolated for: At least 3 days (72 hours) have passed since recovery, defined as resolution of fever without the use of fever reducing medications and improvement in respiratory symptoms (e.g., cough, shortness of breath), and At least 7 days have passed since symptoms first appeared.

## 2020-02-07 NOTE — ED Provider Notes (Signed)
[facility] [facility] EMERGENCY DEPARTMENT Provider Note   CSN: [id number] Arrival date & time: 02/07/20  1135     History Chief Complaint  Patient presents with  . Chest Pain    Megan Webb is a 25 y.o. female.  HPI Patient is a 25 year old female with past medical history of anemia, anxiety, depression, thyroid nodule without hypo or hyperthyroidism  Patient is 25 year old female presented today with chest pain starting Sunday. She states it is intermittent and seems to be left-sided although at times it is right-sided. She states that it is actually better with breathing including deep breathing. She states she is a non-smoker although she does use occasional marijuana. She has history of anxiety she does not take any medications for this. She states that she was recently cheated on by her husband and this has been causing her lots of anxiety and stress.  She denies any history of PEs or DVTs. No recent surgeries, hospitalization, long travel, hemoptysis, estrogen containing OCP, cancer history.  No unilateral leg swelling.  No history of PE or VTE.  No history of first-degree relatives dying of heart attacks. She states that her symptoms are neither exertional nor seem be worse in any particular position. She has noticed that symptoms when she is laying down at night symptoms seem to be worse.  No other associate symptoms. No aggravating factors. She states the chest pain is not radiating to anywhere else on her chest or back or neck or arm. She states that she also notices that it seems to be worse when she pushes on the area.    Past Medical History:  Diagnosis Date  . Anemia    on Iron supplements  . Anxiety    in the past; not current  . Depression    in the past; not current  . Headache    has migraines, takes Extra Strength Tylenol with minimal relief  . Hyperthyroidism    nodule on thyroid; but not diagnosed with hyperthyroidism  . Umbilical hernia  2952    Patient Active Problem List   Diagnosis Date Noted  . Migraine without aura and without status migrainosus, not intractable 04/14/2019  . Anemia 03/12/2018  . Umbilical hernia 84/13/2440  . Thyroid nodule 08/19/2017  . Cannabis abuse 08/16/2011  . History of suicide attempt 08/15/2011  . Anxiety 08/15/2011    Past Surgical History:  Procedure Laterality Date  . BUNIONECTOMY       OB History    Gravida  2   Para  2   Term  2   Preterm  0   AB  0   Living  2     SAB  0   IAB  0   Ectopic  0   Multiple  0   Live Births  2           Family History  Problem Relation Age of Onset  . Mental illness Mother        anxiety and depression  . Hypertension Mother   . Kidney disease Mother   . Anemia Mother   . Cataracts Father   . Asthma Brother   . Diabetes Maternal Aunt   . Cancer Maternal Aunt        breast  . Diabetes Maternal Uncle   . Arthritis Maternal Grandmother   . Stroke Maternal Grandmother   . Hypertension Maternal Grandmother   . Arthritis Maternal Grandfather   . Hypertension Maternal Grandfather   . Stroke  Maternal Grandfather   . Arthritis Paternal Grandfather   . Stroke Paternal Grandfather     Social History   Tobacco Use  . Smoking status: Never Smoker  . Smokeless tobacco: Never Used  Vaping Use  . Vaping Use: Never used  Substance Use Topics  . Alcohol use: No    Comment: occ  . Drug use: Yes    Frequency: 1.0 times per week    Types: Marijuana    Home Medications Prior to Admission medications   Medication Sig Start Date End Date Taking? Authorizing Provider  hydrOXYzine (ATARAX/VISTARIL) 25 MG tablet Take 1 tablet (25 mg total) by mouth every 6 (six) hours. 02/07/20  Yes Katya Rolston S, PA  citalopram (CELEXA) 20 MG tablet Take 1 tablet (20 mg total) by mouth daily. 11/23/18 04/01/19  Truett Mainland, DO    Allergies    Patient has no known allergies.  Review of Systems   Review of Systems   Constitutional: Negative for chills and fever.  HENT: Negative for congestion.   Eyes: Negative for pain.  Respiratory: Negative for cough and shortness of breath.   Cardiovascular: Positive for chest pain. Negative for leg swelling.  Gastrointestinal: Negative for abdominal pain and vomiting.  Genitourinary: Negative for dysuria.  Musculoskeletal: Negative for myalgias.  Skin: Negative for rash.  Neurological: Negative for dizziness and headaches.    Physical Exam Updated Vital Signs BP 124/78   Pulse 80   Temp 98 F (36.7 C) (Oral)   Resp 17   LMP 01/28/2020   SpO2 99%   Physical Exam Vitals and nursing note reviewed.  Constitutional:      General: She is not in acute distress. HENT:     Head: Normocephalic and atraumatic.     Nose: Nose normal.     Mouth/Throat:     Mouth: Mucous membranes are moist.  Eyes:     General: No scleral icterus. Cardiovascular:     Rate and Rhythm: Normal rate and regular rhythm.     Pulses: Normal pulses.     Heart sounds: Normal heart sounds.     Comments: Heart rate within normal limits. No tachycardia. No murmurs rubs or gallops. Pulmonary:     Effort: Pulmonary effort is normal. No respiratory distress.     Breath sounds: No wheezing.     Comments: Lungs are clear to auscultation all fields. No reproducible tenderness to palpation of the anterior chest wall. Abdominal:     Palpations: Abdomen is soft.     Tenderness: There is no abdominal tenderness.  Musculoskeletal:     Cervical back: Normal range of motion.     Right lower leg: No edema.     Left lower leg: No edema.  Skin:    General: Skin is warm and dry.     Capillary Refill: Capillary refill takes less than 2 seconds.  Neurological:     Mental Status: She is alert. Mental status is at baseline.  Psychiatric:     Comments: Somewhat shaky and slightly tearful when discussing husband's discretion. Otherwise calm.     ED Results / Procedures / Treatments   Labs (all  labs ordered are listed, but only abnormal results are displayed) Labs Reviewed  BASIC METABOLIC PANEL - Abnormal; Notable for the following components:      Result Value   Potassium 3.4 (*)    All other components within normal limits  SARS CORONAVIRUS 2 (TAT 6-24 HRS)  CBC  PREGNANCY, URINE  TROPONIN  I (HIGH SENSITIVITY)    EKG EKG Interpretation  Date/Time:  Thursday February 07 2020 11:44:12 EST Ventricular Rate:  85 PR Interval:  130 QRS Duration: 100 QT Interval:  362 QTC Calculation: 430 R Axis:   65 Text Interpretation: Normal sinus rhythm with sinus arrhythmia Nonspecific T wave abnormality Abnormal ECG No significant change since last tracing Confirmed by Calvert Cantor [phone]) on 02/07/2020 11:56:29 AM   Radiology DG Chest Port 1 View  Result Date: 02/07/2020 CLINICAL DATA:  Chest pain and shortness of breath EXAM: PORTABLE CHEST 1 VIEW COMPARISON:  March 13, 2011 FINDINGS: The lungs are clear. The heart size and pulmonary vascularity are normal. No adenopathy. No bone lesions. No pneumothorax. IMPRESSION: Lungs clear.  Cardiac silhouette normal. Electronically Signed   By: Lowella Grip III M.D.   On: 02/07/2020 15:31    Procedures Procedures (including critical care time)  Medications Ordered in ED Medications - No data to display  ED Course  I have reviewed the triage vital signs and the nursing notes.  Pertinent labs & imaging results that were available during my care of the patient were reviewed by me and considered in my medical decision making (see chart for details).    MDM Rules/Calculators/A&P                           Patient is 25 year old female with past medical history of anemia anxiety depression.  Recently cheated on by her husband states that this has been causing her significant anxiety and stress.  She states that since Sunday she has been having some chest tightness.  She states it does not feel worse with breathing and is  nonexertional and nonpleuritic.  Symptoms actually feels better when she takes deep breaths.  She describes as nonradiating and somewhat left-sided.  Physical exam is unremarkable.  She is bilateral radial artery pulses that are symmetric.  Lungs are clear to auscultation all fields she has no murmurs rubs or gallops and she is not tachycardic.  She is also afebrile and very well-appearing.  Somewhat anxious.  Chest x-ray reviewed by myself.  I agree of radiology read that it is unremarkable.  There is no infiltrate or pneumothorax or evidence of acute infection.  Troponin x1 within normal limits/undetectable.  BMP unremarkable.  Urine pregnancy negative CBC without leukocytosis or anemia.  COVID test obtained and pending at time of discharge.  EKG reviewed by Dr. Sheldon is unremarkable.  No ST-T wave abnormalities.  She is not having positional changes I have low suspicion for pericarditis and myocarditis is ruled out by negative troponin.  Patient is PERC negative I have low suspicion for ACS.  Will discharge patient with hydroxyzine to use at home. Strict return precautions given. She is understanding of plan and agreeable. She will follow-up with her primary care doctor as well as reach out for therapist.  Final Clinical Impression(s) / ED Diagnoses Final diagnoses:  Chest pain, unspecified type    Rx / DC Orders ED Discharge Orders         Ordered    hydrOXYzine (ATARAX/VISTARIL) 25 MG tablet  Every 6 hours        01 /13/22 [place], [place], [place] 02/07/20 2056    Breck Coons, MD 02/11/20 [phone]

## 2020-02-07 NOTE — ED Triage Notes (Signed)
Pt reports chest pain started Sunday. SOB, nausea yesterday. Pt reports current CP as tightness in triage currently

## 2020-02-20 ENCOUNTER — Encounter: Payer: BC Managed Care – PPO | Admitting: Family Medicine

## 2020-05-20 ENCOUNTER — Ambulatory Visit (INDEPENDENT_AMBULATORY_CARE_PROVIDER_SITE_OTHER): Payer: BC Managed Care – PPO | Admitting: Family Medicine

## 2020-05-20 ENCOUNTER — Encounter: Payer: Self-pay | Admitting: Advanced Practice Midwife

## 2020-05-20 ENCOUNTER — Other Ambulatory Visit (HOSPITAL_COMMUNITY)
Admission: RE | Admit: 2020-05-20 | Discharge: 2020-05-20 | Disposition: A | Discharge status: Home / Self Care | Payer: BC Managed Care – PPO | Source: Ambulatory Visit | Attending: Family Medicine | Admitting: Family Medicine

## 2020-05-20 ENCOUNTER — Other Ambulatory Visit: Payer: Self-pay

## 2020-05-20 VITALS — BP 131/83 | HR 75 | Wt 167.0 lb

## 2020-05-20 DIAGNOSIS — O219 Vomiting of pregnancy, unspecified: Secondary | ICD-10-CM

## 2020-05-20 DIAGNOSIS — Z9151 Personal history of suicidal behavior: Secondary | ICD-10-CM

## 2020-05-20 DIAGNOSIS — F419 Anxiety disorder, unspecified: Secondary | ICD-10-CM

## 2020-05-20 DIAGNOSIS — E041 Nontoxic single thyroid nodule: Secondary | ICD-10-CM

## 2020-05-20 DIAGNOSIS — Z3480 Encounter for supervision of other normal pregnancy, unspecified trimester: Secondary | ICD-10-CM | POA: Diagnosis present

## 2020-05-20 DIAGNOSIS — Z8759 Personal history of other complications of pregnancy, childbirth and the puerperium: Secondary | ICD-10-CM

## 2020-05-20 DIAGNOSIS — O26819 Pregnancy related exhaustion and fatigue, unspecified trimester: Secondary | ICD-10-CM

## 2020-05-20 DIAGNOSIS — O09299 Supervision of pregnancy with other poor reproductive or obstetric history, unspecified trimester: Secondary | ICD-10-CM

## 2020-05-20 DIAGNOSIS — F1210 Cannabis abuse, uncomplicated: Secondary | ICD-10-CM

## 2020-05-20 MED ORDER — HYDROXYZINE HCL 25 MG PO TABS: 25.0000 mg | ORAL_TABLET | Freq: Four times a day (QID) | ORAL | 0 refills | Status: DC

## 2020-05-20 NOTE — Patient Instructions (Signed)
[email or web address]-->contraception  Safe Medications in Pregnancy   Acne:  Benzoyl Peroxide  Salicylic Acid   Backache/Headache:  Tylenol: 2 regular strength every 4 hours OR        2 Extra strength every 6 hours   Colds/Coughs/Allergies:  Benadryl (alcohol free) 25 mg every 6 hours as needed  Breath right strips  Claritin  Cepacol throat lozenges  Chloraseptic throat spray  Cold-Eeze- up to three times per day  Cough drops, alcohol free  Flonase (by prescription only)  Guaifenesin  Mucinex  Robitussin DM (plain only, alcohol free)  Saline nasal spray/drops  Sudafed (pseudoephedrine) & Actifed * use only after [redacted] weeks gestation and if you do not have high blood pressure  Tylenol  Vicks Vaporub  Zinc lozenges  Zyrtec   Constipation:  Colace  Ducolax suppositories  Fleet enema  Glycerin suppositories  Metamucil  Milk of magnesia  Miralax  Senokot  Smooth move tea   Diarrhea:  Kaopectate  Imodium A-D   *NO pepto Bismol   Hemorrhoids:  Anusol  Anusol HC  Preparation H  Tucks   Indigestion:  Tums  Maalox  Mylanta  Zantac  Pepcid   Insomnia:  Benadryl (alcohol free) 25mg  every 6 hours as needed  Tylenol PM  Unisom, no Gelcaps   Leg Cramps:  Tums  MagGel   Nausea/Vomiting:  Bonine  Dramamine  Emetrol  Ginger extract  Sea bands  Meclizine  Nausea medication to take during pregnancy:  Unisom (doxylamine succinate 25 mg tablets) Take one tablet daily at bedtime. If symptoms are not adequately controlled, the dose can be increased to a maximum recommended dose of two tablets daily (1/2 tablet in the morning, 1/2 tablet mid-afternoon and one at bedtime).  Vitamin B6 100mg  tablets. Take one tablet twice a day (up to 200 mg per day).   Skin Rashes:  Aveeno products  Benadryl cream or 25mg  every 6 hours as needed  Calamine Lotion  1% cortisone cream   Yeast infection:  Gyne-lotrimin 7  Monistat 7    **If taking multiple medications,  please check labels to avoid duplicating the same active ingredients  **take medication as directed on the label  ** Do not exceed 4000 mg of tylenol in 24 hours  **Do not take medications that contain aspirin or ibuprofen

## 2020-05-20 NOTE — Progress Notes (Signed)
History:   Megan Webb is a 25 y.o. G3P2002 at [redacted]w[redacted]d by early ultrasound being seen today for her first obstetrical visit.  Her obstetrical history is uncomplicated. Patient does intend to breast feed. Pregnancy history fully reviewed.  Patient reports vaginal irritation. Otherwise asymptomatic.  Vaginal irritation -some vaginal itching -no abnormal discharge -no dysuria, hematuria, urgency, frequency -burning with monostat in the past -concerned she has yeast infection  Nausea -still able to tolerate PO -minimal vomitting -no diarrhea or constipation -no sick contacts -no fever/chills -no marijuana use since finding out she was pregnant  Rash -chest and face -2-3 weeks -non pruritic -non painful -no new soaps, detergents, body washes, perfumes etc -has not tried any meds      HISTORY: OB History  Gravida Para Term Preterm AB Living  3 2 2  0 0 2  SAB IAB Ectopic Multiple Live Births  0 0 0 0 2    # Outcome Date GA Lbr Len/2nd Weight Sex Delivery Anes PTL Lv  3 Current           2 Term 03/10/18 [redacted]w[redacted]d 03:36 / 00:17 8 lb 5.3 oz (3.779 kg) F Vag-Spont EPI  LIV     Birth Comments: moulding     Name: RENTERIA BARRERA,GIRL Webb     Apgar1: 9  Apgar5: 9  1 Term 03/12/15 [redacted]w[redacted]d 04:09 / 00:13 7 lb 4.6 oz (3.305 kg) F Vag-Spont EPI, Local  LIV     Name: Megan Webb     Apgar1: 8  Apgar5: 9    Last pap smear was done 06/2019 and was normal  Past Medical History:  Diagnosis Date  . Anemia    on Iron supplements  . Anxiety    in the past; not current  . Depression    in the past; not current  . Headache    has migraines, takes Extra Strength Tylenol with minimal relief  . Hyperthyroidism    nodule on thyroid; but not diagnosed with hyperthyroidism  . Umbilical hernia [id number]   Past Surgical History:  Procedure Laterality Date  . BUNIONECTOMY     Family History  Problem Relation Age of Onset  . Mental illness Mother        anxiety  and depression  . Hypertension Mother   . Kidney disease Mother   . Anemia Mother   . Cataracts Father   . Asthma Brother   . Diabetes Maternal Aunt   . Cancer Maternal Aunt        breast  . Diabetes Maternal Uncle   . Arthritis Maternal Grandmother   . Stroke Maternal Grandmother   . Hypertension Maternal Grandmother   . Arthritis Maternal Grandfather   . Hypertension Maternal Grandfather   . Stroke Maternal Grandfather   . Arthritis Paternal Grandfather   . Stroke Paternal Grandfather    Social History   Tobacco Use  . Smoking status: Never Smoker  . Smokeless tobacco: Never Used  Vaping Use  . Vaping Use: Never used  Substance Use Topics  . Alcohol use: No    Comment: occ  . Drug use: Yes    Frequency: 1.0 times per week    Types: Marijuana   No Known Allergies Current Outpatient Medications on File Prior to Visit  Medication Sig Dispense Refill  . ferrous sulfate 325 (65 FE) MG tablet Take 325 mg by mouth daily with breakfast.    . Prenatal Vit-Fe Fumarate-FA (PRENATAL VITAMINS PO) Take by mouth.    . [  DISCONTINUED] citalopram (CELEXA) 20 MG tablet Take 1 tablet (20 mg total) by mouth daily. 30 tablet 12   No current facility-administered medications on file prior to visit.    Review of Systems Pertinent items noted in HPI and remainder of comprehensive ROS otherwise negative. Physical Exam:   Vitals:   05/20/20 0842  BP: 131/83  Pulse: 75  Weight: 167 lb (75.8 kg)   Fetal Heart Rate (bpm): 188  Patient informed that the ultrasound is considered a limited obstetric ultrasound and is not intended to be a complete ultrasound exam.  Patient also informed that the ultrasound is not being completed with the intent of assessing for fetal or placental anomalies or any pelvic abnormalities.  Explained that the purpose of today's ultrasound is to assess for fetal heart rate.  Patient acknowledges the purpose of the exam and the limitations of the study. Uterus:      Pelvic Exam: Perineum:    Vulva:    Vagina:     Cervix:    Adnexa:    Bony Pelvis:   System: General: well-developed, well-nourished female in no acute distress   Breasts:  normal appearance, no masses or tenderness bilaterally   Skin: normal coloration and turgor. Papular rash covering face and chest.   Neurologic: oriented, normal, negative, normal mood   Extremities: normal strength, tone, and muscle mass, ROM of all joints is normal   HEENT PERRLA, extraocular movement intact and sclera clear, anicteric   Mouth/Teeth mucous membranes moist, pharynx normal without lesions and dental hygiene good   Neck supple and no masses   Cardiovascular: regular rate and rhythm   Respiratory:  no respiratory distress, normal breath sounds   Abdomen: soft, non-tender; bowel sounds normal; no masses,  no organomegaly    Assessment:    Pregnancy: [id number] Patient Active Problem List   Diagnosis Date Noted  . Supervision of other normal pregnancy, antepartum 05/20/2020  . History of gestational hypertension 05/20/2020  . Migraine without aura and without status migrainosus, not intractable 04/14/2019  . Anemia 03/12/2018  . Umbilical hernia 32/35/5732  . H/O pre-eclampsia in prior pregnancy, currently pregnant 08/19/2017  . Thyroid nodule 08/19/2017  . Cannabis abuse 08/16/2011  . History of suicide attempt 08/15/2011  . Anxiety 08/15/2011  . Benzodiazepine (tranquilizer) overdose 08/14/2011     Plan:     Supervision of other normal pregnancy, antepartum -Doing well, complaints as noted below -Taking PNV -Discussed contraception, patient is considering BTL vs other forms of contraception -Patient desires genetic testing, given GA, nursing staff elects to defer to next visit. AFP 15-20 weeks -Needs bASA at 16 weeks given history -     CHL AMB BABYSCRIPTS SCHEDULE OPTIMIZATION -     Culture, OB Urine -     [place] MFM OB COMP + 14 WK; Future -     CBC/D/Plt+RPR+Rh+ABO+Rub Ab... -      Cervicovaginal ancillary only( [place]) -     Comprehensive metabolic panel -     Protein / creatinine ratio, urine -     POC Urinalysis Dipstick OB -     TSH -     Vitamin D 1,25 dihydroxy -     B12  History of suicide attempt Anxiety Suicide attempt 2013, patient endorses anxiety but currently feels mood is stable. Briefly took celexa in the past but didn't tolerate side effects. Had success with PRN hydroxyzine.   History of gestational hypertension BP elevated today, suspect patient may have cHTN. PreE labs today.  bASA at 16 weeks. Continue to monitor. -     Comprehensive metabolic panel -     Protein / creatinine ratio, urine  H/O pre-eclampsia in prior pregnancy, currently pregnant -     Comprehensive metabolic panel -     Protein / creatinine ratio, urine  Cannabis abuse Has not used since finding out she was pregnant. Denies other drug use. Counseled on n/v and cannabis use.  Thyroid nodule Monitored by ENT, non cancerous. Has not had to be on thyroid meds in the past. Besides fatigue, asymptomatic. Will check TSH today and patient encouraged to follow up with ENT. -     TSH  Fatigue during pregnancy, antepartum -     TSH -     Vitamin D 1,25 dihydroxy -     B12  Nausea in pregnancy No red flag symptoms. Normal first trimester nausea. Encouraged conservative management and given list of safe pregnancy OTC meds, encouraged unisom/B6 use. Denies marijuana use since finding out she was pregnant. Encouraged small frequent meals.    Initial labs drawn. Continue prenatal vitamins. Problem list reviewed and updated. Genetic Screening discussed, NIPS: requested, collect at next visit Ultrasound discussed; fetal anatomic survey: ordered. Anticipatory guidance about prenatal visits given including labs, ultrasounds, and testing. Discussed usage of Babyscripts and virtual visits as additional source of managing and completing prenatal visits in midst of coronavirus and  pandemic.   Encouraged to complete MyChart Registration for her ability to review results, send requests, and have questions addressed.  The nature of [place] for [facility]Faculty Practice with multiple MDs and Advanced Practice Providers was explained to patient; also emphasized that residents, students are part of our team. Routine obstetric precautions reviewed. Encouraged to seek out care at office or emergency room [facility] preferred) for urgent and/or emergent concerns. Return in about 4 weeks (around 06/17/2020) for [place]; in person.     Arrie Senate, MD OB Fellow, [place] for [place] 05/20/2020 9:50 AM

## 2020-05-20 NOTE — Progress Notes (Signed)
DATING AND VIABILITY SONOGRAM   Megan Webb is a 24 y.o. year old G3P2002 with LMP Patient's last menstrual period was 03/04/2020. which would correlate to  [redacted]w[redacted]d weeks gestation.  She has regular menstrual cycles.   She is here today for a confirmatory initial sonogram.    GESTATION: SINGLETON     FETAL ACTIVITY:          Heart rate         188 bpm          The fetus is active.     ADNEXA: The ovaries are normal.   GESTATIONAL AGE AND  BIOMETRICS:  Gestational criteria: Estimated Date of Delivery: 12/23/20 by early ultrasound now at [redacted]w[redacted]d  Previous Scans:0      CROWN RUMP LENGTH          2.25 cm         9-0weeks           2.30 cm         9-0 weeks                                                                           AVERAGE EGA(BY THIS SCAN):  9-0 weeks  WORKING EDD( early ultrasound ):  12-23-2020     TECHNICIAN COMMENTS:  Patient informed that the ultrasound is considered a limited obstetric ultrasound and is not intended to be a complete ultrasound exam. Patient also informed that the ultrasound is not being completed with the intent of assessing for fetal or placental anomalies or any pelvic abnormalities. Explained that the purpose of today's ultrasound is to assess for fetal heart rate. Patient acknowledges the purpose of the exam and the limitations of the study.     Kathrene Alu 05/20/2020 9:16 AM

## 2020-05-21 ENCOUNTER — Other Ambulatory Visit: Payer: Self-pay | Admitting: Family Medicine

## 2020-05-21 DIAGNOSIS — B373 Candidiasis of vulva and vagina: Secondary | ICD-10-CM

## 2020-05-21 DIAGNOSIS — B3731 Acute candidiasis of vulva and vagina: Secondary | ICD-10-CM

## 2020-05-21 LAB — CERVICOVAGINAL ANCILLARY ONLY
Bacterial Vaginitis (gardnerella): NEGATIVE
Candida Glabrata: POSITIVE — AB
Candida Vaginitis: POSITIVE — AB
Chlamydia: NEGATIVE
Comment: NEGATIVE
Comment: NEGATIVE
Comment: NEGATIVE
Comment: NEGATIVE
Comment: NEGATIVE
Comment: NORMAL
Neisseria Gonorrhea: NEGATIVE
Trichomonas: NEGATIVE

## 2020-05-21 LAB — PROTEIN / CREATININE RATIO, URINE
Creatinine, Urine: 35 mg/dL
Protein, Ur: 5.9 mg/dL
Protein/Creat Ratio: 169 mg/g creat (ref 0–200)

## 2020-05-21 MED ORDER — CLOTRIMAZOLE 1 % VA CREA: 1.0000 | TOPICAL_CREAM | Freq: Every day | VAGINAL | 0 refills | Status: DC

## 2020-05-21 NOTE — Progress Notes (Signed)
Rx for yeast sent to pharmacy. Patient sent myChart message.  Arrie Senate, MD OB Fellow, [place] for [place] 05/21/2020 4:47 PM

## 2020-05-22 LAB — URINE CULTURE, OB REFLEX

## 2020-05-22 LAB — CULTURE, OB URINE

## 2020-05-30 LAB — COMPREHENSIVE METABOLIC PANEL
ALT: 10 IU/L (ref 0–32)
AST: 11 IU/L (ref 0–40)
Albumin/Globulin Ratio: 1.6 (ref 1.2–2.2)
Albumin: 4.2 g/dL (ref 3.9–5.0)
Alkaline Phosphatase: 50 IU/L (ref 44–121)
BUN/Creatinine Ratio: 16 (ref 9–23)
BUN: 8 mg/dL (ref 6–20)
Bilirubin Total: 0.2 mg/dL (ref 0.0–1.2)
CO2: 19 mmol/L — ABNORMAL LOW (ref 20–29)
Calcium: 9.3 mg/dL (ref 8.7–10.2)
Chloride: 103 mmol/L (ref 96–106)
Creatinine, Ser: 0.5 mg/dL — ABNORMAL LOW (ref 0.57–1.00)
Globulin, Total: 2.6 g/dL (ref 1.5–4.5)
Glucose: 87 mg/dL (ref 65–99)
Potassium: 4.4 mmol/L (ref 3.5–5.2)
Sodium: 137 mmol/L (ref 134–144)
Total Protein: 6.8 g/dL (ref 6.0–8.5)
eGFR: 133 mL/min/{1.73_m2} (ref >59)

## 2020-05-30 LAB — CBC/D/PLT+RPR+RH+ABO+RUB AB...
Antibody Screen: NEGATIVE
Basophils Absolute: 0 10*3/uL (ref 0.0–0.2)
Basos: 0 %
EOS (ABSOLUTE): 0.1 10*3/uL (ref 0.0–0.4)
Eos: 1 %
HCV Ab: <0.1 s/co ratio (ref 0.0–0.9)
HIV Screen 4th Generation wRfx: NONREACTIVE
Hematocrit: 37 % (ref 34.0–46.6)
Hemoglobin: 12.8 g/dL (ref 11.1–15.9)
Hepatitis B Surface Ag: NEGATIVE
Immature Grans (Abs): 0 10*3/uL (ref 0.0–0.1)
Immature Granulocytes: 0 %
Lymphocytes Absolute: 1.7 10*3/uL (ref 0.7–3.1)
Lymphs: 20 %
MCH: 30.5 pg (ref 26.6–33.0)
MCHC: 34.6 g/dL (ref 31.5–35.7)
MCV: 88 fL (ref 79–97)
Monocytes Absolute: 0.6 10*3/uL (ref 0.1–0.9)
Monocytes: 7 %
Neutrophils Absolute: 6.2 10*3/uL (ref 1.4–7.0)
Neutrophils: 72 %
Platelets: 260 10*3/uL (ref 150–450)
RBC: 4.19 x10E6/uL (ref 3.77–5.28)
RDW: 15.2 % (ref 11.7–15.4)
RPR Ser Ql: NONREACTIVE
Rh Factor: POSITIVE
Rubella Antibodies, IGG: 3.26 index (ref >0.99)
WBC: 8.6 10*3/uL (ref 3.4–10.8)

## 2020-05-30 LAB — VITAMIN D 1,25 DIHYDROXY
Vitamin D 1, 25 (OH)2 Total: 87 pg/mL — ABNORMAL HIGH
Vitamin D2 1, 25 (OH)2: <10 pg/mL
Vitamin D3 1, 25 (OH)2: 87 pg/mL

## 2020-05-30 LAB — TSH: TSH: 0.426 u[IU]/mL — ABNORMAL LOW (ref 0.450–4.500)

## 2020-05-30 LAB — VITAMIN B12: Vitamin B-12: 477 pg/mL (ref 232–1245)

## 2020-05-30 LAB — HCV INTERPRETATION

## 2020-06-02 ENCOUNTER — Other Ambulatory Visit: Payer: Self-pay | Admitting: Lactation Services

## 2020-06-02 DIAGNOSIS — Z3480 Encounter for supervision of other normal pregnancy, unspecified trimester: Secondary | ICD-10-CM

## 2020-06-17 ENCOUNTER — Other Ambulatory Visit: Payer: Self-pay

## 2020-06-17 ENCOUNTER — Encounter: Payer: Self-pay | Admitting: Medical

## 2020-06-17 ENCOUNTER — Ambulatory Visit (INDEPENDENT_AMBULATORY_CARE_PROVIDER_SITE_OTHER): Payer: BC Managed Care – PPO | Admitting: Medical

## 2020-06-17 VITALS — BP 128/78 | HR 78 | Wt 164.0 lb

## 2020-06-17 DIAGNOSIS — Z8759 Personal history of other complications of pregnancy, childbirth and the puerperium: Secondary | ICD-10-CM

## 2020-06-17 DIAGNOSIS — O26891 Other specified pregnancy related conditions, first trimester: Secondary | ICD-10-CM

## 2020-06-17 DIAGNOSIS — O09299 Supervision of pregnancy with other poor reproductive or obstetric history, unspecified trimester: Secondary | ICD-10-CM

## 2020-06-17 DIAGNOSIS — Z3A13 13 weeks gestation of pregnancy: Secondary | ICD-10-CM

## 2020-06-17 DIAGNOSIS — Z3480 Encounter for supervision of other normal pregnancy, unspecified trimester: Secondary | ICD-10-CM

## 2020-06-17 DIAGNOSIS — R102 Pelvic and perineal pain: Secondary | ICD-10-CM

## 2020-06-17 DIAGNOSIS — F419 Anxiety disorder, unspecified: Secondary | ICD-10-CM

## 2020-06-17 NOTE — Progress Notes (Signed)
1-3 weeks.

## 2020-06-17 NOTE — Patient Instructions (Signed)

## 2020-06-17 NOTE — Progress Notes (Signed)
   PRENATAL VISIT NOTE  Subjective:  Megan Webb is a 25 y.o. G3P2002 at [redacted]w[redacted]d being seen today for ongoing prenatal care.  She is currently monitored for the following issues for this high-risk pregnancy and has Benzodiazepine (tranquilizer) overdose; History of suicide attempt; Anxiety; Cannabis abuse; H/O pre-eclampsia in prior pregnancy, currently pregnant; Thyroid nodule; Umbilical hernia; Anemia; Migraine without aura and without status migrainosus, not intractable; Supervision of other normal pregnancy, antepartum; and History of gestational hypertension on their problem list.  Patient reports pelvic pain and constipation.  Contractions: Not present. Vag. Bleeding: None.  Movement: Absent. Denies leaking of fluid.   The following portions of the patient's history were reviewed and updated as appropriate: allergies, current medications, past family history, past medical history, past social history, past surgical history and problem list.   Objective:   Vitals:   06/17/20 1124  BP: 128/78  Pulse: 78  Weight: 164 lb (74.4 kg)    Fetal Status: Fetal Heart Rate (bpm): [place]   Movement: Absent     General:  Alert, oriented and cooperative. Patient is in no acute distress.  Skin: Skin is warm and dry. No rash noted.   Cardiovascular: Normal heart rate noted  Respiratory: Normal respiratory effort, no problems with respiration noted  Abdomen: Soft, gravid, appropriate for gestational age.  Pain/Pressure: Absent     Pelvic: Cervical exam deferred        Extremities: Normal range of motion.  Edema: None  Mental Status: Normal mood and affect. Normal behavior. Normal judgment and thought content.    Pt informed that the ultrasound is considered a limited OB ultrasound and is not intended to be a complete ultrasound exam.  Patient also informed that the ultrasound is not being completed with the intent of assessing for fetal or placental anomalies or any pelvic abnormalities.   Explained that the purpose of today's ultrasound is to assess for  viability.  Patient acknowledges the purpose of the exam and the limitations of the study. Unable to obtain FHR with doppler. +FHR and +FM noted on [place].    Assessment and Plan:  Pregnancy: G3P2002 at [redacted]w[redacted]d 1. Supervision of other normal pregnancy, antepartum - Genetic Screening - Anatomy US scheduled 07/30/20 - Patient endorses some LLQ pain as well as mild constipation, discussed safe medications for management   2. History of gestational hypertension - Normotensive today  - Advised start BASA at 16 weeks   3. H/O pre-eclampsia in prior pregnancy, currently pregnant  4. Anxiety - Considering medications, will discuss at next visit   5. Pelvic pain affecting pregnancy in first trimester, antepartum - Discussed possible reasons for pain, advised belly band, ice, hydrotherapy and tylenol   6. [redacted] weeks gestation of pregnancy  Preterm labor symptoms and general obstetric precautions including but not limited to vaginal bleeding, contractions, leaking of fluid and fetal movement were reviewed in detail with the patient. Please refer to After Visit Summary for other counseling recommendations.   Return in about 4 weeks (around 07/15/2020) for LOB, In-Person - [place] preferred.  Future Appointments  Date Time Provider [place]  07/15/2020  8:35 AM Seabron Spates, CNM [facility] None  07/30/2020 10:15 AM [facility] US2 [facility]  08/12/2020  8:35 AM Seabron Spates, CNM [facility] None    Kerry Hough, PA-C

## 2020-06-27 ENCOUNTER — Encounter: Payer: Self-pay | Admitting: Medical

## 2020-06-27 ENCOUNTER — Encounter: Payer: Self-pay | Admitting: General Practice

## 2020-07-01 ENCOUNTER — Encounter: Payer: Self-pay | Admitting: General Practice

## 2020-07-03 ENCOUNTER — Other Ambulatory Visit: Payer: Self-pay

## 2020-07-03 DIAGNOSIS — B373 Candidiasis of vulva and vagina: Secondary | ICD-10-CM

## 2020-07-03 DIAGNOSIS — B3731 Acute candidiasis of vulva and vagina: Secondary | ICD-10-CM

## 2020-07-03 MED ORDER — CLOTRIMAZOLE 1 % VA CREA: 1.0000 | TOPICAL_CREAM | Freq: Every day | VAGINAL | 0 refills | Status: DC

## 2020-07-03 NOTE — Progress Notes (Signed)
Pt c/o vaginal yeast infection. Request Rx to be sent to pharmacy. Rx sent for same medication used prior since patient is pregnant.

## 2020-07-15 ENCOUNTER — Ambulatory Visit (INDEPENDENT_AMBULATORY_CARE_PROVIDER_SITE_OTHER): Payer: Medicaid Other | Admitting: Advanced Practice Midwife

## 2020-07-15 ENCOUNTER — Encounter: Payer: Self-pay | Admitting: Advanced Practice Midwife

## 2020-07-15 ENCOUNTER — Other Ambulatory Visit: Payer: Self-pay

## 2020-07-15 VITALS — BP 116/75 | HR 87 | Wt 165.0 lb

## 2020-07-15 DIAGNOSIS — Z3A17 17 weeks gestation of pregnancy: Secondary | ICD-10-CM

## 2020-07-15 DIAGNOSIS — E041 Nontoxic single thyroid nodule: Secondary | ICD-10-CM

## 2020-07-15 MED ORDER — ASPIRIN 81 MG PO CHEW: 81.0000 mg | CHEWABLE_TABLET | Freq: Every day | ORAL | 1 refills | Status: DC

## 2020-07-15 MED ORDER — ASPIRIN 81 MG PO CHEW: 81.0000 mg | CHEWABLE_TABLET | Freq: Every day | ORAL | Status: DC

## 2020-07-15 NOTE — Progress Notes (Signed)
   PRENATAL VISIT NOTE  Subjective:  Megan Webb is a 25 y.o. G3P2002 at [redacted]w[redacted]d being seen today for ongoing prenatal care.  She is currently monitored for the following issues for this high risk pregnancy and has Benzodiazepine (tranquilizer) overdose; History of suicide attempt; Anxiety; Cannabis abuse; H/O pre-eclampsia in prior pregnancy, currently pregnant; Thyroid nodule; Umbilical hernia; Anemia; Migraine without aura and without status migrainosus, not intractable; Supervision of other normal pregnancy, antepartum; and History of gestational hypertension on their problem list.  Patient reports no complaints.  Contractions: Not present. Vag. Bleeding: None.  Movement: Present. Denies leaking of fluid.   The following portions of the patient's history were reviewed and updated as appropriate: allergies, current medications, past family history, past medical history, past social history, past surgical history and problem list.   Objective:   Vitals:   07/15/20 0854  BP: 116/75  Pulse: 87  Weight: 165 lb (74.8 kg)    Fetal Status: Fetal Heart Rate (bpm): 158   Movement: Present     General:  Alert, oriented and cooperative. Patient is in no acute distress.  Skin: Skin is warm and dry. No rash noted.   Cardiovascular: Normal heart rate noted  Respiratory: Normal respiratory effort, no problems with respiration noted  Abdomen: Soft, gravid, appropriate for gestational age.  Pain/Pressure: Present     Pelvic: Cervical exam deferred        Extremities: Normal range of motion.  Edema: None  Mental Status: Normal mood and affect. Normal behavior. Normal judgment and thought content.   Assessment and Plan:  Pregnancy: G3P2002 at [redacted]w[redacted]d 1. 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 Thyroid nodule      Has never had to be on meds for this      Will recheck levels  today - T3 - T4, free - TSH  Preterm labor symptoms and general obstetric precautions including but not limited to vaginal bleeding, contractions, leaking of fluid and fetal movement were reviewed in detail with the patient. Please refer to After Visit Summary for other counseling recommendations.   Return in about 6 weeks (around 08/26/2020) for [facility]  Future Appointments  Date Time Provider [place]  07/30/2020 10:15 AM [facility] US2 [facility]  08/29/2020  8:35 AM Donnamae Jude, MD [facility] None    Hansel Feinstein, CNM

## 2020-07-17 LAB — AFP, SERUM, OPEN SPINA BIFIDA
AFP MoM: 0.98
AFP Value: 36.7 ng/mL
Gest. Age on Collection Date: 17 weeks
Maternal Age At EDD: 26.7 yr
OSBR Risk 1 IN: 10000
Test Results:: NEGATIVE
Weight: 165 [lb_av]

## 2020-07-17 LAB — T3: T3, Total: 218 ng/dL — ABNORMAL HIGH (ref 71–180)

## 2020-07-17 LAB — T4, FREE: Free T4: 0.77 ng/dL — ABNORMAL LOW (ref 0.82–1.77)

## 2020-07-17 LAB — TSH: TSH: 1.58 u[IU]/mL (ref 0.450–4.500)

## 2020-07-18 ENCOUNTER — Telehealth: Payer: Self-pay

## 2020-07-18 ENCOUNTER — Other Ambulatory Visit: Payer: Self-pay | Admitting: Advanced Practice Midwife

## 2020-07-18 DIAGNOSIS — E041 Nontoxic single thyroid nodule: Secondary | ICD-10-CM

## 2020-07-18 NOTE — Progress Notes (Unsigned)
Thyroid labs abnormal  Known Left Thyroid nodule Per Dr Nehemiah Settle, will get [place] and refer to Endocrine

## 2020-07-18 NOTE — Telephone Encounter (Signed)
Called pt to discuss thyroid labs. Pt made aware that her labs are abnormal and she will need a Thyroid US and a referral to Endocrinology. Understanding was voiced. Anael Rosch l Elodie Panameno, CMA

## 2020-07-30 ENCOUNTER — Other Ambulatory Visit: Payer: Self-pay | Admitting: Family Medicine

## 2020-07-30 ENCOUNTER — Other Ambulatory Visit: Payer: Self-pay | Admitting: *Deleted

## 2020-07-30 ENCOUNTER — Other Ambulatory Visit: Payer: Self-pay

## 2020-07-30 ENCOUNTER — Ambulatory Visit: Payer: Medicaid Other | Attending: Obstetrics and Gynecology

## 2020-07-30 DIAGNOSIS — Z363 Encounter for antenatal screening for malformations: Secondary | ICD-10-CM | POA: Insufficient documentation

## 2020-07-30 DIAGNOSIS — Z3A19 19 weeks gestation of pregnancy: Secondary | ICD-10-CM | POA: Diagnosis not present

## 2020-07-30 DIAGNOSIS — O99342 Other mental disorders complicating pregnancy, second trimester: Secondary | ICD-10-CM | POA: Insufficient documentation

## 2020-07-30 DIAGNOSIS — E079 Disorder of thyroid, unspecified: Secondary | ICD-10-CM | POA: Insufficient documentation

## 2020-07-30 DIAGNOSIS — O09292 Supervision of pregnancy with other poor reproductive or obstetric history, second trimester: Secondary | ICD-10-CM | POA: Insufficient documentation

## 2020-07-30 DIAGNOSIS — Z3480 Encounter for supervision of other normal pregnancy, unspecified trimester: Secondary | ICD-10-CM

## 2020-07-30 DIAGNOSIS — Z8759 Personal history of other complications of pregnancy, childbirth and the puerperium: Secondary | ICD-10-CM

## 2020-07-30 DIAGNOSIS — Z3482 Encounter for supervision of other normal pregnancy, second trimester: Secondary | ICD-10-CM

## 2020-07-30 DIAGNOSIS — O99282 Endocrine, nutritional and metabolic diseases complicating pregnancy, second trimester: Secondary | ICD-10-CM | POA: Insufficient documentation

## 2020-07-31 ENCOUNTER — Ambulatory Visit (HOSPITAL_BASED_OUTPATIENT_CLINIC_OR_DEPARTMENT_OTHER)
Admission: RE | Admit: 2020-07-31 | Discharge: 2020-07-31 | Disposition: A | Discharge status: Home / Self Care | Payer: Medicaid Other | Source: Ambulatory Visit | Attending: Advanced Practice Midwife | Admitting: Advanced Practice Midwife

## 2020-07-31 DIAGNOSIS — E041 Nontoxic single thyroid nodule: Secondary | ICD-10-CM | POA: Diagnosis not present

## 2020-08-12 ENCOUNTER — Encounter: Payer: BC Managed Care – PPO | Admitting: Advanced Practice Midwife

## 2020-08-25 ENCOUNTER — Ambulatory Visit: Payer: BC Managed Care – PPO | Attending: Obstetrics and Gynecology

## 2020-08-25 ENCOUNTER — Encounter: Payer: Self-pay | Admitting: *Deleted

## 2020-08-25 ENCOUNTER — Other Ambulatory Visit: Payer: Self-pay

## 2020-08-25 ENCOUNTER — Ambulatory Visit: Payer: BC Managed Care – PPO | Admitting: *Deleted

## 2020-08-25 ENCOUNTER — Other Ambulatory Visit: Payer: Self-pay | Admitting: *Deleted

## 2020-08-25 VITALS — BP 130/67 | HR 81

## 2020-08-25 DIAGNOSIS — Z3480 Encounter for supervision of other normal pregnancy, unspecified trimester: Secondary | ICD-10-CM | POA: Diagnosis present

## 2020-08-25 DIAGNOSIS — Z362 Encounter for other antenatal screening follow-up: Secondary | ICD-10-CM | POA: Diagnosis not present

## 2020-08-25 DIAGNOSIS — Z8759 Personal history of other complications of pregnancy, childbirth and the puerperium: Secondary | ICD-10-CM | POA: Insufficient documentation

## 2020-08-25 DIAGNOSIS — O99282 Endocrine, nutritional and metabolic diseases complicating pregnancy, second trimester: Secondary | ICD-10-CM

## 2020-08-25 DIAGNOSIS — Z3A22 22 weeks gestation of pregnancy: Secondary | ICD-10-CM

## 2020-08-25 DIAGNOSIS — E079 Disorder of thyroid, unspecified: Secondary | ICD-10-CM

## 2020-08-25 DIAGNOSIS — O99342 Other mental disorders complicating pregnancy, second trimester: Secondary | ICD-10-CM | POA: Diagnosis not present

## 2020-08-25 DIAGNOSIS — O99280 Endocrine, nutritional and metabolic diseases complicating pregnancy, unspecified trimester: Secondary | ICD-10-CM

## 2020-08-25 MED ORDER — FLUCONAZOLE 150 MG PO TABS: 150.0000 mg | ORAL_TABLET | Freq: Once | ORAL | 0 refills | Status: AC

## 2020-08-29 ENCOUNTER — Encounter: Payer: Self-pay | Admitting: Family Medicine

## 2020-08-29 ENCOUNTER — Other Ambulatory Visit: Payer: Self-pay

## 2020-08-29 ENCOUNTER — Ambulatory Visit (INDEPENDENT_AMBULATORY_CARE_PROVIDER_SITE_OTHER): Payer: BC Managed Care – PPO | Admitting: Family Medicine

## 2020-08-29 VITALS — BP 108/71 | HR 83 | Wt 173.0 lb

## 2020-08-29 DIAGNOSIS — Z3480 Encounter for supervision of other normal pregnancy, unspecified trimester: Secondary | ICD-10-CM

## 2020-08-29 DIAGNOSIS — Z3A23 23 weeks gestation of pregnancy: Secondary | ICD-10-CM

## 2020-08-29 MED ORDER — TERCONAZOLE 0.4 % VA CREA: 1.0000 | TOPICAL_CREAM | Freq: Every day | VAGINAL | 0 refills | Status: DC

## 2020-08-29 NOTE — Progress Notes (Signed)
   PRENATAL VISIT NOTE  Subjective:  Megan Webb is a 25 y.o. G3P2002 at 20w3dbeing seen today for ongoing prenatal care.  She is currently monitored for the following issues for this low-risk pregnancy and has Benzodiazepine (tranquilizer) overdose; History of suicide attempt; Anxiety; Cannabis abuse; H/O pre-eclampsia in prior pregnancy, currently pregnant; Thyroid nodule; Umbilical hernia; Anemia; Migraine without aura and without status migrainosus, not intractable; Supervision of other normal pregnancy, antepartum; and History of gestational hypertension on their problem list.  Patient reports vaginal irritation.  Contractions: Not present. Vag. Bleeding: None.  Movement: Present. Denies leaking of fluid.   The following portions of the patient's history were reviewed and updated as appropriate: allergies, current medications, past family history, past medical history, past social history, past surgical history and problem list.   Objective:   Vitals:   08/29/20 0825  BP: 108/71  Pulse: 83  Weight: 173 lb (78.5 kg)    Fetal Status: Fetal Heart Rate (bpm): 143 Fundal Height: 26 cm Movement: Present     General:  Alert, oriented and cooperative. Patient is in no acute distress.  Skin: Skin is warm and dry. No rash noted.   Cardiovascular: Normal heart rate noted  Respiratory: Normal respiratory effort, no problems with respiration noted  Abdomen: Soft, gravid, appropriate for gestational age.  Pain/Pressure: Absent     Pelvic: Cervical exam deferred        Extremities: Normal range of motion.  Edema: Trace  Mental Status: Normal mood and affect. Normal behavior. Normal judgment and thought content.   Assessment and Plan:  Pregnancy: G3P2002 at 247w3d. [redacted] weeks gestation of pregnancy   2. Supervision of other normal pregnancy, antepartum Continue routine prenatal care. Per MFM her u/s shows LGA--want early glucola-- Reviewed dating u/s--c/w 9 wks at the  time--  3. Vaginal itching S/p diflucan x 1--terconazole rx sent in  General obstetric precautions including but not limited to vaginal bleeding, contractions, leaking of fluid and fetal movement were reviewed in detail with the patient. Please refer to After Visit Summary for other counseling recommendations.   Return in 2 weeks (on 09/12/2020) for 28 wk labs.  Future Appointments  Date Time Provider [place]8/15/20[place]  8:30 AM [facility] NURSE [facility] None  09/26/2020  8:55 AM StTruett MainlandDO [facility] None  10/01/2020  9:00 AM [facility] NURSE [facility]  9:15 AM [facility] US2 [facility]/16/2022 10:35 AM EcClarnce FlockMD [facility] None  10/15/2020  8:30 AM Shamleffer, IbMelanie CrazierMD [facility] None  10/22/2020  8:55 AM StTruett MainlandDO [facility] None    TaDonnamae JudeMD

## 2020-08-29 NOTE — Patient Instructions (Signed)

## 2020-09-08 ENCOUNTER — Other Ambulatory Visit: Payer: BC Managed Care – PPO

## 2020-09-18 ENCOUNTER — Other Ambulatory Visit: Payer: BC Managed Care – PPO

## 2020-09-23 ENCOUNTER — Other Ambulatory Visit: Payer: BC Managed Care – PPO

## 2020-09-23 ENCOUNTER — Other Ambulatory Visit: Payer: Self-pay

## 2020-09-23 DIAGNOSIS — Z8759 Personal history of other complications of pregnancy, childbirth and the puerperium: Secondary | ICD-10-CM

## 2020-09-23 DIAGNOSIS — Z3A27 27 weeks gestation of pregnancy: Secondary | ICD-10-CM

## 2020-09-23 NOTE — Progress Notes (Signed)
Patient sent to lab for early 2 hr gtt. Kathrene Alu RN

## 2020-09-24 LAB — GLUCOSE TOLERANCE, 2 HOURS W/ 1HR
Glucose, 1 hour: 147 mg/dL (ref 65–179)
Glucose, 2 hour: 124 mg/dL (ref 65–152)
Glucose, Fasting: 79 mg/dL (ref 65–91)

## 2020-09-26 ENCOUNTER — Ambulatory Visit (INDEPENDENT_AMBULATORY_CARE_PROVIDER_SITE_OTHER): Payer: BC Managed Care – PPO | Admitting: Family Medicine

## 2020-09-26 ENCOUNTER — Other Ambulatory Visit: Payer: Self-pay

## 2020-09-26 VITALS — BP 113/67 | HR 84 | Wt 177.0 lb

## 2020-09-26 DIAGNOSIS — Z8759 Personal history of other complications of pregnancy, childbirth and the puerperium: Secondary | ICD-10-CM

## 2020-09-26 DIAGNOSIS — Z3480 Encounter for supervision of other normal pregnancy, unspecified trimester: Secondary | ICD-10-CM

## 2020-09-26 DIAGNOSIS — O09299 Supervision of pregnancy with other poor reproductive or obstetric history, unspecified trimester: Secondary | ICD-10-CM

## 2020-09-26 DIAGNOSIS — O3663X0 Maternal care for excessive fetal growth, third trimester, not applicable or unspecified: Secondary | ICD-10-CM

## 2020-09-26 NOTE — Progress Notes (Signed)
   PRENATAL VISIT NOTE  Subjective:  Megan Webb is a 25 y.o. G3P2002 at 41w3dbeing seen today for ongoing prenatal care.  She is currently monitored for the following issues for this low-risk pregnancy and has Benzodiazepine (tranquilizer) overdose; History of suicide attempt; Anxiety; Cannabis abuse; H/O pre-eclampsia in prior pregnancy, currently pregnant; Thyroid nodule; Umbilical hernia; Anemia; Migraine without aura and without status migrainosus, not intractable; Supervision of other normal pregnancy, antepartum; and History of gestational hypertension on their problem list.  Patient reports no complaints.  Contractions: Not present. Vag. Bleeding: None.  Movement: Present. Denies leaking of fluid.   The following portions of the patient's history were reviewed and updated as appropriate: allergies, current medications, past family history, past medical history, past social history, past surgical history and problem list.   Objective:   Vitals:   09/26/20 0903  BP: 113/67  Pulse: 84  Weight: 177 lb (80.3 kg)    Fetal Status: Fetal Heart Rate (bpm): 144   Movement: Present     General:  Alert, oriented and cooperative. Patient is in no acute distress.  Skin: Skin is warm and dry. No rash noted.   Cardiovascular: Normal heart rate noted  Respiratory: Normal respiratory effort, no problems with respiration noted  Abdomen: Soft, gravid, appropriate for gestational age.  Pain/Pressure: Absent     Pelvic: Cervical exam deferred        Extremities: Normal range of motion.     Mental Status: Normal mood and affect. Normal behavior. Normal judgment and thought content.   Assessment and Plan:  Pregnancy: G3P2002 at 257w3d. Supervision of other normal pregnancy, antepartum - CBC - RPR - HIV Antibody (routine testing w rflx)  2. History of gestational hypertension BP normal  3. H/O pre-eclampsia in prior pregnancy, currently pregnant ASA '81mg'$   4. Fetal macrosomia  in third trimester, single or unspecified fetus Continue serial US[place]or growth 2hr GTT normal  Preterm labor symptoms and general obstetric precautions including but not limited to vaginal bleeding, contractions, leaking of fluid and fetal movement were reviewed in detail with the patient. Please refer to After Visit Summary for other counseling recommendations.   No follow-ups on file.  Future Appointments  Date Time Provider [place]  9:00 AM [facility]/07/2020  9:15 AM [facility] US[facility]/16[facility]2022 10:35 AM EcClarnce FlockMD [facility] None  10/15/2020  8:30 AM Shamleffer, IbMelanie CrazierMD [facility] None  10/22/2020  8:55 AM StTruett MainlandDO [facility] None    JaTruett MainlandDO

## 2020-09-27 LAB — CBC
Hematocrit: 29.8 % — ABNORMAL LOW (ref 34.0–46.6)
Hemoglobin: 9.5 g/dL — ABNORMAL LOW (ref 11.1–15.9)
MCH: 26.9 pg (ref 26.6–33.0)
MCHC: 31.9 g/dL (ref 31.5–35.7)
MCV: 84 fL (ref 79–97)
Platelets: 201 10*3/uL (ref 150–450)
RBC: 3.53 x10E6/uL — ABNORMAL LOW (ref 3.77–5.28)
RDW: 13.9 % (ref 11.7–15.4)
WBC: 6.3 10*3/uL (ref 3.4–10.8)

## 2020-09-27 LAB — RPR: RPR Ser Ql: NONREACTIVE

## 2020-09-27 LAB — HIV ANTIBODY (ROUTINE TESTING W REFLEX): HIV Screen 4th Generation wRfx: NONREACTIVE

## 2020-10-01 ENCOUNTER — Ambulatory Visit: Payer: BC Managed Care – PPO | Admitting: *Deleted

## 2020-10-01 ENCOUNTER — Encounter: Payer: Self-pay | Admitting: *Deleted

## 2020-10-01 ENCOUNTER — Ambulatory Visit: Payer: BC Managed Care – PPO | Attending: Obstetrics

## 2020-10-01 ENCOUNTER — Other Ambulatory Visit: Payer: Self-pay

## 2020-10-01 ENCOUNTER — Other Ambulatory Visit: Payer: Self-pay | Admitting: *Deleted

## 2020-10-01 VITALS — BP 123/69 | HR 87

## 2020-10-01 DIAGNOSIS — Z8759 Personal history of other complications of pregnancy, childbirth and the puerperium: Secondary | ICD-10-CM | POA: Insufficient documentation

## 2020-10-01 DIAGNOSIS — Z362 Encounter for other antenatal screening follow-up: Secondary | ICD-10-CM | POA: Diagnosis not present

## 2020-10-01 DIAGNOSIS — Z3480 Encounter for supervision of other normal pregnancy, unspecified trimester: Secondary | ICD-10-CM

## 2020-10-01 DIAGNOSIS — O09293 Supervision of pregnancy with other poor reproductive or obstetric history, third trimester: Secondary | ICD-10-CM

## 2020-10-01 DIAGNOSIS — E079 Disorder of thyroid, unspecified: Secondary | ICD-10-CM | POA: Diagnosis not present

## 2020-10-01 DIAGNOSIS — O99283 Endocrine, nutritional and metabolic diseases complicating pregnancy, third trimester: Secondary | ICD-10-CM | POA: Diagnosis not present

## 2020-10-01 DIAGNOSIS — O99280 Endocrine, nutritional and metabolic diseases complicating pregnancy, unspecified trimester: Secondary | ICD-10-CM | POA: Insufficient documentation

## 2020-10-01 DIAGNOSIS — Z3A28 28 weeks gestation of pregnancy: Secondary | ICD-10-CM

## 2020-10-01 DIAGNOSIS — O3663X0 Maternal care for excessive fetal growth, third trimester, not applicable or unspecified: Secondary | ICD-10-CM

## 2020-10-10 ENCOUNTER — Telehealth (INDEPENDENT_AMBULATORY_CARE_PROVIDER_SITE_OTHER): Payer: Medicaid Other | Admitting: Family Medicine

## 2020-10-10 ENCOUNTER — Encounter: Payer: Self-pay | Admitting: Family Medicine

## 2020-10-10 DIAGNOSIS — O99891 Other specified diseases and conditions complicating pregnancy: Secondary | ICD-10-CM

## 2020-10-10 DIAGNOSIS — D649 Anemia, unspecified: Secondary | ICD-10-CM

## 2020-10-10 DIAGNOSIS — O09299 Supervision of pregnancy with other poor reproductive or obstetric history, unspecified trimester: Secondary | ICD-10-CM

## 2020-10-10 DIAGNOSIS — Z8759 Personal history of other complications of pregnancy, childbirth and the puerperium: Secondary | ICD-10-CM

## 2020-10-10 DIAGNOSIS — O09293 Supervision of pregnancy with other poor reproductive or obstetric history, third trimester: Secondary | ICD-10-CM

## 2020-10-10 DIAGNOSIS — O99013 Anemia complicating pregnancy, third trimester: Secondary | ICD-10-CM

## 2020-10-10 DIAGNOSIS — O99283 Endocrine, nutritional and metabolic diseases complicating pregnancy, third trimester: Secondary | ICD-10-CM

## 2020-10-10 DIAGNOSIS — Z3480 Encounter for supervision of other normal pregnancy, unspecified trimester: Secondary | ICD-10-CM

## 2020-10-10 DIAGNOSIS — Z3A29 29 weeks gestation of pregnancy: Secondary | ICD-10-CM

## 2020-10-10 DIAGNOSIS — E041 Nontoxic single thyroid nodule: Secondary | ICD-10-CM

## 2020-10-10 DIAGNOSIS — R002 Palpitations: Secondary | ICD-10-CM

## 2020-10-10 NOTE — Progress Notes (Signed)
Patient agreed to virtual visit. Kathrene Alu RN

## 2020-10-10 NOTE — Progress Notes (Signed)
I connected with Megan Webb 10/10/20 at 10:35 AM EDT by: MyChart video and verified that I am speaking with the correct person using two identifiers.  Patient is located at work and provider is located at [employer] for [employer]     The purpose of this virtual visit is to provide medical care while limiting exposure to the novel coronavirus. I discussed the limitations, risks, security and privacy concerns of performing an evaluation and management service by MyChart video and the availability of in person appointments. I also discussed with the patient that there may be a patient responsible charge related to this service. By engaging in this virtual visit, you consent to the provision of healthcare.  Additionally, you authorize for your insurance to be billed for the services provided during this visit.  The patient expressed understanding and agreed to proceed.  The following staff members participated in the virtual visit:  Clarnce Flock, MD/MPH Attending Family Medicine Physician, [place] for [place] VISIT NOTE  Subjective:  Megan Webb is a 25 y.o. G3P2002 at [redacted]w[redacted]d. for phone visit for ongoing prenatal care.  She is currently monitored for the following issues for this high-risk pregnancy and has Benzodiazepine (tranquilizer) overdose; History of suicide attempt; Anxiety; Cannabis abuse; H/O pre-eclampsia in prior pregnancy, currently pregnant; Thyroid nodule; Umbilical hernia; Anemia; Migraine without aura and without status migrainosus, not intractable; Supervision of other normal pregnancy, antepartum; and History of gestational hypertension on their problem list.  Patient reports no complaints.   . Vag. Bleeding: None.  Movement: Present. Denies leaking of fluid.   The following portions of the patient's history were reviewed and updated as appropriate: allergies,  current medications, past family history, past medical history, past social history, past surgical history and problem list.   Objective:  There were no vitals filed for this visit. Self-Obtained  Fetal Status:     Movement: Present     Assessment and Plan:  Pregnancy: G3P2002 at 261w3d. Supervision of other normal pregnancy, antepartum Good fetal movement Offer TDaP next visit, recheck thyroid studies  2. History of gestational hypertension On ASA  3. H/O pre-eclampsia in prior pregnancy, currently pregnant On ASA  4. Thyroid nodule Stable size per last [place]. Anemia, unspecified type Taking PO iron, last hgb 9.5  6. Palpitations Having intermittent brief palpitations, does not remember having them in other pregnancies Discussed that this is common but reasonable to obtain echo for peace of mind, order placed Discussed warning signs that should prompt ED/MAU eval (prolonged symptoms, associated chest pain or SOB, syncope)  Preterm labor symptoms and general obstetric precautions including but not limited to vaginal bleeding, contractions, leaking of fluid and fetal movement were reviewed in detail with the patient.  Return in about 2 weeks (around 10/24/2020) for ob visit.  Future Appointments  Date Time Provider [place]9/21/2022  8:30 AM Shamleffer, IbMelanie CrazierMD [facility] None  10/23/2020  3:10 PM StTruett MainlandDO [facility] None  10/30/2020  8:30 AM [facility] NURSE [facility]10/06/2020  8:45 AM [facility]10/13/2022  9:35 AM PrDonnamae JudeMD [facility] None  11/20/2020  9:35 AM StTruett MainlandDO [facility] None     Time spent on virtual visit: 12 minutes  MaClarnce FlockMD

## 2020-10-15 ENCOUNTER — Other Ambulatory Visit: Payer: Self-pay

## 2020-10-15 ENCOUNTER — Encounter: Payer: Self-pay | Admitting: Internal Medicine

## 2020-10-15 ENCOUNTER — Ambulatory Visit (INDEPENDENT_AMBULATORY_CARE_PROVIDER_SITE_OTHER): Payer: 59 | Admitting: Internal Medicine

## 2020-10-15 VITALS — BP 118/74 | HR 95 | Ht 63.0 in | Wt 177.8 lb

## 2020-10-15 DIAGNOSIS — E041 Nontoxic single thyroid nodule: Secondary | ICD-10-CM

## 2020-10-15 LAB — TSH: TSH: 1.33 u[IU]/mL (ref 0.35–5.50)

## 2020-10-15 NOTE — Progress Notes (Signed)
Name: Megan Webb  MRN/ DOB: [id number], Sep 02, 1995    Age/ Sex: 25 y.o., female    PCP: Shelda Pal, DO   Reason for Endocrinology Evaluation: Thyroid nodule      Date of Initial Endocrinology Evaluation: 10/15/2020     HPI: Megan Webb is a 25 y.o. female with a past medical history of Thyroid nodule . The patient presented for initial endocrinology clinic visit on 10/15/2020 for consultative assistance with her Thyroid nodule .    In review of her records she has been diagnosed with left thyroid nodule 2.2 cm in 2018 and an FNA was recommended. I am unable to find records of FNA of the thyroid nodule but per pt she already had an FNA  through [facility] .   In 2021 repeat ultrasound confirmed left thyroid nodule and FNA was again recommended. By 07/2020 the nodule showed stability.    She was also found to have low TSH during labs 04/2020  at 0.426 uIU/mL , repeat labs in 06/2020 showed normalization of TSH 1.580 with elevated T3 at 218 ng/dL and low FT4 at 0.77 ng/dL      She is currently at 30.1 weeks of gestation  EDD 11/29th, 2022  She has noted occasional pain in the neck , she noted her neck enlarged through pictures  She has been gaining weight through pregnancy  Denies diarrhea or loose stools  Has chronic constipation  Has occasional palpitations  Has occasional nausea but no vomiting   No fh of thyroid disease     HISTORY:  Past Medical History:  Past Medical History:  Diagnosis Date   Anemia    on Iron supplements   Anxiety    in the past; not current   Depression    in the past; not current   Headache    has migraines, takes Extra Strength Tylenol with minimal relief   Hyperthyroidism    nodule on thyroid; but not diagnosed with hyperthyroidism   Umbilical hernia 2637   Past Surgical History:  Past Surgical History:  Procedure Laterality Date   BUNIONECTOMY      Social History:  reports that she has  never smoked. She has never used smokeless tobacco. She reports current drug use. Frequency: 1.00 time per week. Drug: Marijuana. She reports that she does not drink alcohol. Family History: family history includes Anemia in her mother; Arthritis in her maternal grandfather, maternal grandmother, and paternal grandfather; Asthma in her brother; Cancer in her maternal aunt; Cataracts in her father; Diabetes in her maternal aunt and maternal uncle; Hypertension in her maternal grandfather, maternal grandmother, and mother; Kidney disease in her mother; Mental illness in her mother; Stroke in her maternal grandfather, maternal grandmother, and paternal grandfather.   HOME MEDICATIONS: Allergies as of 10/15/2020   No Known Allergies      Medication List        Accurate as of October 15, 2020  7:13 AM. If you have any questions, ask your nurse or doctor.          aspirin 81 MG chewable tablet Chew 1 tablet (81 mg total) by mouth daily.   ferrous sulfate 325 (65 FE) MG tablet Take 325 mg by mouth daily with breakfast.   PRENATAL VITAMINS PO Take by mouth.          REVIEW OF SYSTEMS: A comprehensive ROS was conducted with the patient and is negative except as per HPI    OBJECTIVE:  VS: LMP 03/04/2020    Wt Readings from Last 3 Encounters:  09/26/20 177 lb (80.3 kg)  08/29/20 173 lb (78.5 kg)  07/15/20 165 lb (74.8 kg)     EXAM: General: Pt appears well and is in NAD  Neck: General: Supple without adenopathy. Thyroid: Thyroid size normal.  Left thyroid nodule appreciated.  Lungs: Clear with good BS bilat with no rales, rhonchi, or wheezes  Heart: Auscultation: RRR.  Abdomen: Normoactive bowel sounds, soft, nontender, without masses or organomegaly palpable  Extremities:  BL LE: No pretibial edema normal ROM and strength.  Skin: Hair: Texture and amount normal with gender appropriate distribution Skin Inspection: No rashes Skin Palpation: Skin temperature, texture,  and thickness normal to palpation  Neuro:  DTRs: 2+ and symmetric in UE without delay in relaxation phase  Mental Status: Judgment, insight: Intact Orientation: Oriented to time, place, and person Memory: Intact for recent and remote events Mood and affect: No depression, anxiety, or agitation     DATA REVIEWED:  Results for DIGNA, COUNTESS (MRN [id number]) as of 10/16/2020 13:37  Ref. Range 10/15/2020 09:25  TSH Latest Ref Range: 0.35 - 5.50 uIU/mL 1.33  Triiodothyronine (T3) Latest Ref Range: 76 - 181 ng/dL 217 (H)  Thyroxine (T4) Latest Ref Range: 5.1 - 11.9 mcg/dL 8.9      FNA 10/11/2016  Left thyroid nodule  The  smear shows large groups of atypical follicular cells  with nuclear fragments and overlap ,  and rare nuclear grooves in a back ground of scant colloid . The differential includes a true follicular neoplasm vs follicular variant of papillary neoplasm   ASSESSMENT/PLAN/RECOMMENDATIONS:   Left thyroid nodule :   - She is S/P FNA in 2018 through [facility]. The result is inconclusive . These was no extra sample for Afirma at the time. Despite stable serial ultrasound , I have recommended repeating FNA with Afirma  - We have opted to proceed with this after delivery,since she is already in the 3rd trimester and would avoid invasive procedure at this time as the risk outweighs the benefit.     2. Abnormal Thyroid Function :   - Reassurance provided, her TSh is normal and the reason for abnormal FT4 and T3 is due to elevated thyroxine binding globulin due to pregnancy      Signed electronically by: Mack Guise, MD  [facility]  [facility] [address] [place], [place] [address] Phone: [phone] FAX: [phone]   CC: Shelda Pal, [address] [place] [address] Phone: [phone] Fax: [phone]   Return to Endocrinology clinic as below: Future Appointments   Date Time Provider [place]  10/15/2020  8:30 AM Thaine Garriga, Melanie Crazier, MD [facility] None  10/23/2020  3:10 PM Truett Mainland, DO [facility] None  10/30/2020  8:30 AM [facility] NURSE [facility]  10/30/2020  8:45 AM [facility]-MFC US5 [facility]  11/06/2020  9:35 AM Donnamae Jude, MD C[facility] None  11/20/2020  9:35 AM Truett Mainland, DO [facility] None

## 2020-10-16 LAB — T4: T4, Total: 8.9 ug/dL (ref 5.1–11.9)

## 2020-10-16 LAB — T3: T3, Total: 217 ng/dL — ABNORMAL HIGH (ref 76–181)

## 2020-10-22 ENCOUNTER — Encounter: Payer: BC Managed Care – PPO | Admitting: Family Medicine

## 2020-10-23 ENCOUNTER — Ambulatory Visit (INDEPENDENT_AMBULATORY_CARE_PROVIDER_SITE_OTHER): Payer: 59 | Admitting: Family Medicine

## 2020-10-23 ENCOUNTER — Other Ambulatory Visit: Payer: Self-pay

## 2020-10-23 VITALS — BP 114/79 | HR 96 | Wt 175.1 lb

## 2020-10-23 DIAGNOSIS — Z3480 Encounter for supervision of other normal pregnancy, unspecified trimester: Secondary | ICD-10-CM

## 2020-10-23 DIAGNOSIS — E041 Nontoxic single thyroid nodule: Secondary | ICD-10-CM

## 2020-10-23 DIAGNOSIS — Z23 Encounter for immunization: Secondary | ICD-10-CM | POA: Diagnosis not present

## 2020-10-23 DIAGNOSIS — D649 Anemia, unspecified: Secondary | ICD-10-CM

## 2020-10-23 DIAGNOSIS — O99343 Other mental disorders complicating pregnancy, third trimester: Secondary | ICD-10-CM

## 2020-10-23 DIAGNOSIS — F419 Anxiety disorder, unspecified: Secondary | ICD-10-CM

## 2020-10-23 DIAGNOSIS — Z3A31 31 weeks gestation of pregnancy: Secondary | ICD-10-CM

## 2020-10-23 DIAGNOSIS — Z8759 Personal history of other complications of pregnancy, childbirth and the puerperium: Secondary | ICD-10-CM

## 2020-10-23 NOTE — Progress Notes (Signed)
   PRENATAL VISIT NOTE  Subjective:  Megan Webb is a 25 y.o. G3P2002 at [redacted]w[redacted]d being seen today for ongoing prenatal care.  She is currently monitored for the following issues for this low-risk pregnancy and has Benzodiazepine (tranquilizer) overdose; History of suicide attempt; Anxiety; Cannabis abuse; H/O pre-eclampsia in prior pregnancy, currently pregnant; Thyroid nodule; Umbilical hernia; Anemia; Migraine without aura and without status migrainosus, not intractable; Supervision of other normal pregnancy, antepartum; and History of gestational hypertension on their problem list.  Patient reports no complaints.  Contractions: Irritability. Vag. Bleeding: None.  Movement: Present. Denies leaking of fluid.   The following portions of the patient's history were reviewed and updated as appropriate: allergies, current medications, past family history, past medical history, past social history, past surgical history and problem list.   Objective:   Vitals:   10/23/20 1513  BP: 114/79  Pulse: 96  Weight: 175 lb 1.3 oz (79.4 kg)    Fetal Status: Fetal Heart Rate (bpm): 164   Movement: Present     General:  Alert, oriented and cooperative. Patient is in no acute distress.  Skin: Skin is warm and dry. No rash noted.   Cardiovascular: Normal heart rate noted  Respiratory: Normal respiratory effort, no problems with respiration noted  Abdomen: Soft, gravid, appropriate for gestational age.  Pain/Pressure: Absent     Pelvic: Cervical exam deferred        Extremities: Normal range of motion.  Edema: None  Mental Status: Normal mood and affect. Normal behavior. Normal judgment and thought content.   Assessment and Plan:  Pregnancy: G3P2002 at [redacted]w[redacted]d 1. [redacted] weeks gestation of pregnancy - Tdap vaccine greater than or equal to 7yo IM - Flu Vaccine QUAD 36+ mos IM (Fluarix, Quad PF)  2. Supervision of other normal pregnancy, antepartum FHT and FH normal - Tdap vaccine greater than or  equal to 7yo IM - Flu Vaccine QUAD 36+ mos IM (Fluarix, Quad PF)  3. History of gestational hypertension BP normal  4. Thyroid nodule Saw endocrine - biopsy postpartum. Thyroid hormone normal  5. Anemia, unspecified type On oral iron  6. Anxiety On vistaril  Preterm labor symptoms and general obstetric precautions including but not limited to vaginal bleeding, contractions, leaking of fluid and fetal movement were reviewed in detail with the patient. Please refer to After Visit Summary for other counseling recommendations.   No follow-ups on file.  Future Appointments  Date Time Provider [place]  10/30/2020  8:30 AM [facility] NURSE [facility]  10/30/2020  8:45 AM [facility]-MFC US5 [facility]  11/06/2020  9:35 AM Donnamae Jude, MD C[facility] None  11/20/2020  9:35 AM Truett Mainland, DO [facility] None  11/26/2020 11:15 AM Truett Mainland, DO [facility] None  12/03/2020 11:15 AM Truett Mainland, DO [facility] None  12/10/2020 11:15 AM Truett Mainland, DO [facility] None  12/17/2020  2:30 PM Lavonia Drafts, MD [facility] None  12/24/2020 11:15 AM Truett Mainland, DO [facility] None  01/14/2021  9:10 AM Shamleffer, Melanie Crazier, MD [facility] None    Truett Mainland, DO

## 2020-10-30 ENCOUNTER — Encounter: Payer: Self-pay | Admitting: *Deleted

## 2020-10-30 ENCOUNTER — Other Ambulatory Visit: Payer: Self-pay | Admitting: *Deleted

## 2020-10-30 ENCOUNTER — Other Ambulatory Visit: Payer: Self-pay

## 2020-10-30 ENCOUNTER — Ambulatory Visit: Payer: 59 | Attending: Obstetrics and Gynecology

## 2020-10-30 ENCOUNTER — Ambulatory Visit: Payer: 59 | Admitting: *Deleted

## 2020-10-30 VITALS — BP 122/70 | HR 87

## 2020-10-30 DIAGNOSIS — Z8759 Personal history of other complications of pregnancy, childbirth and the puerperium: Secondary | ICD-10-CM | POA: Diagnosis present

## 2020-10-30 DIAGNOSIS — O3663X0 Maternal care for excessive fetal growth, third trimester, not applicable or unspecified: Secondary | ICD-10-CM

## 2020-10-30 DIAGNOSIS — Z3480 Encounter for supervision of other normal pregnancy, unspecified trimester: Secondary | ICD-10-CM

## 2020-10-30 DIAGNOSIS — Z362 Encounter for other antenatal screening follow-up: Secondary | ICD-10-CM

## 2020-10-30 DIAGNOSIS — O99280 Endocrine, nutritional and metabolic diseases complicating pregnancy, unspecified trimester: Secondary | ICD-10-CM | POA: Diagnosis present

## 2020-10-30 DIAGNOSIS — O99283 Endocrine, nutritional and metabolic diseases complicating pregnancy, third trimester: Secondary | ICD-10-CM

## 2020-10-30 DIAGNOSIS — E079 Disorder of thyroid, unspecified: Secondary | ICD-10-CM

## 2020-10-30 DIAGNOSIS — Z3A32 32 weeks gestation of pregnancy: Secondary | ICD-10-CM

## 2020-11-03 ENCOUNTER — Telehealth: Payer: Self-pay

## 2020-11-03 MED ORDER — FLUCONAZOLE 150 MG PO TABS: 150.0000 mg | ORAL_TABLET | Freq: Once | ORAL | 0 refills | Status: AC

## 2020-11-03 NOTE — Telephone Encounter (Signed)
Patient called stating she has signs and symptoms of yeast infection. Patient states it started two days ago and she is having lots of vaginal itching. Patient requests diflucan to help get rid of yeast infection. Kathrene Alu RN

## 2020-11-06 ENCOUNTER — Ambulatory Visit (INDEPENDENT_AMBULATORY_CARE_PROVIDER_SITE_OTHER): Payer: Medicaid Other | Admitting: Family Medicine

## 2020-11-06 ENCOUNTER — Other Ambulatory Visit: Payer: Self-pay

## 2020-11-06 VITALS — BP 117/64 | HR 96 | Wt 180.0 lb

## 2020-11-06 DIAGNOSIS — Z8759 Personal history of other complications of pregnancy, childbirth and the puerperium: Secondary | ICD-10-CM

## 2020-11-06 DIAGNOSIS — Z3A33 33 weeks gestation of pregnancy: Secondary | ICD-10-CM

## 2020-11-06 DIAGNOSIS — F419 Anxiety disorder, unspecified: Secondary | ICD-10-CM

## 2020-11-06 DIAGNOSIS — Z3480 Encounter for supervision of other normal pregnancy, unspecified trimester: Secondary | ICD-10-CM

## 2020-11-06 MED ORDER — HYDROXYZINE HCL 25 MG PO TABS: 25.0000 mg | ORAL_TABLET | ORAL | 2 refills | Status: DC | PRN

## 2020-11-06 NOTE — Patient Instructions (Signed)

## 2020-11-06 NOTE — Progress Notes (Signed)
   PRENATAL VISIT NOTE  Subjective:  Megan Webb is a 25 y.o. G3P2002 at [redacted]w[redacted]d being seen today for ongoing prenatal care.  She is currently monitored for the following issues for this low-risk pregnancy and has Benzodiazepine (tranquilizer) overdose; History of suicide attempt; Anxiety; Cannabis abuse; H/O pre-eclampsia in prior pregnancy, currently pregnant; Thyroid nodule; Umbilical hernia; Anemia; Migraine without aura and without status migrainosus, not intractable; Supervision of other normal pregnancy, antepartum; and History of gestational hypertension on their problem list.  Patient reports  pelvic pressure when lying down .  Contractions: Irritability. Vag. Bleeding: None.  Movement: Present. Denies leaking of fluid.   The following portions of the patient's history were reviewed and updated as appropriate: allergies, current medications, past family history, past medical history, past social history, past surgical history and problem list.   Objective:   Vitals:   11/06/20 0939  BP: 117/64  Pulse: 96  Weight: 180 lb (81.6 kg)    Fetal Status: Fetal Heart Rate (bpm): 150 Fundal Height: 35 cm Movement: Present  Presentation: Vertex  General:  Alert, oriented and cooperative. Patient is in no acute distress.  Skin: Skin is warm and dry. No rash noted.   Cardiovascular: Normal heart rate noted  Respiratory: Normal respiratory effort, no problems with respiration noted  Abdomen: Soft, gravid, appropriate for gestational age.  Pain/Pressure: Present     Pelvic: Cervical exam deferred        Extremities: Normal range of motion.  Edema: None  Mental Status: Normal mood and affect. Normal behavior. Normal judgment and thought content.   Assessment and Plan:  Pregnancy: G3P2002 at [redacted]w[redacted]d 1. [redacted] weeks gestation of pregnancy   2. Supervision of other normal pregnancy, antepartum Continue routine prenatal care. Up to date on all needs LGA--on u/s baby at 95%--consider  IOL at 39 wks.  3. History of gestational hypertension ON ASA, BP is good today  4. Anxiety Continue Vistaril--rx given - hydrOXYzine (ATARAX/VISTARIL) 25 MG tablet; Take 1 tablet (25 mg total) by mouth as needed.  Dispense: 30 tablet; Refill: 2  Preterm labor symptoms and general obstetric precautions including but not limited to vaginal bleeding, contractions, leaking of fluid and fetal movement were reviewed in detail with the patient. Please refer to After Visit Summary for other counseling recommendations.   Return in 2 weeks (on 11/20/2020).  Future Appointments  Date Time Provider [place]  11/20/2020  9:35 AM Truett Mainland, DO [facility] None  11/26/2020 11:15 AM Truett Mainland, DO [facility] None  12/03/2020 11:15 AM Truett Mainland, DO [facility] None  12/04/2020  8:30 AM [facility] NURSE [facility]  12/04/2020  8:45 AM [facility] US4 [facility]  12/10/2020 11:15 AM Truett Mainland, DO [facility] None  12/17/2020  2:30 PM Lavonia Drafts, MD [facility] None  12/24/2020 11:15 AM Truett Mainland, DO [facility] None  01/14/2021  9:10 AM Shamleffer, Melanie Crazier, MD [facility] None    Donnamae Jude, MD

## 2020-11-20 ENCOUNTER — Ambulatory Visit (INDEPENDENT_AMBULATORY_CARE_PROVIDER_SITE_OTHER): Payer: 59 | Admitting: Family Medicine

## 2020-11-20 ENCOUNTER — Other Ambulatory Visit: Payer: Self-pay

## 2020-11-20 VITALS — BP 128/77 | HR 84 | Wt 182.0 lb

## 2020-11-20 DIAGNOSIS — O3663X0 Maternal care for excessive fetal growth, third trimester, not applicable or unspecified: Secondary | ICD-10-CM

## 2020-11-20 DIAGNOSIS — Z3480 Encounter for supervision of other normal pregnancy, unspecified trimester: Secondary | ICD-10-CM

## 2020-11-20 DIAGNOSIS — Z8759 Personal history of other complications of pregnancy, childbirth and the puerperium: Secondary | ICD-10-CM

## 2020-11-20 DIAGNOSIS — Z3A35 35 weeks gestation of pregnancy: Secondary | ICD-10-CM

## 2020-11-20 DIAGNOSIS — O09299 Supervision of pregnancy with other poor reproductive or obstetric history, unspecified trimester: Secondary | ICD-10-CM

## 2020-11-20 NOTE — Progress Notes (Signed)
   PRENATAL VISIT NOTE  Subjective:  Megan Webb is a 25 y.o. G3P2002 at [redacted]w[redacted]d being seen today for ongoing prenatal care.  She is currently monitored for the following issues for this low-risk pregnancy and has Benzodiazepine (tranquilizer) overdose; History of suicide attempt; Anxiety; Cannabis abuse; H/O pre-eclampsia in prior pregnancy, currently pregnant; Thyroid nodule; Umbilical hernia; Anemia; Migraine without aura and without status migrainosus, not intractable; Supervision of other normal pregnancy, antepartum; and History of gestational hypertension on their problem list.  Patient reports no complaints.  Contractions: Irregular. Vag. Bleeding: None.  Movement: Present. Denies leaking of fluid.   The following portions of the patient's history were reviewed and updated as appropriate: allergies, current medications, past family history, past medical history, past social history, past surgical history and problem list.   Objective:   Vitals:   11/20/20 0944  BP: 128/77  Pulse: 84  Weight: 182 lb (82.6 kg)    Fetal Status: Fetal Heart Rate (bpm): 150 Fundal Height: 37 cm Movement: Present     General:  Alert, oriented and cooperative. Patient is in no acute distress.  Skin: Skin is warm and dry. No rash noted.   Cardiovascular: Normal heart rate noted  Respiratory: Normal respiratory effort, no problems with respiration noted  Abdomen: Soft, gravid, appropriate for gestational age.  Pain/Pressure: Present     Pelvic: Cervical exam deferred        Extremities: Normal range of motion.  Edema: None  Mental Status: Normal mood and affect. Normal behavior. Normal judgment and thought content.   Assessment and Plan:  Pregnancy: G3P2002 at [redacted]w[redacted]d 1. Supervision of other normal pregnancy, antepartum FHT and FH normal  2. [redacted] weeks gestation of pregnancy   3. History of gestational hypertension On ASA 81mg  BP normal  4. H/O pre-eclampsia in prior pregnancy,  currently pregnant   5. Fetal macrosomia in third trimester, single or unspecified fetus Discussed induction at 39 weeks - patient amenable  Preterm labor symptoms and general obstetric precautions including but not limited to vaginal bleeding, contractions, leaking of fluid and fetal movement were reviewed in detail with the patient. Please refer to After Visit Summary for other counseling recommendations.   No follow-ups on file.  Future Appointments  Date Time Provider [place]  11/26/2020 11:15 AM Truett Mainland, DO [facility] None  12/03/2020 11:15 AM Truett Mainland, DO [facility] None  12/04/2020  8:30 AM [facility] NURSE [facility]  12/04/2020  8:45 AM [facility]MFC US4 [facility]  12/05/2020  9:00 AM MHP-ECHO 1 MHP-ECHO [facility]  12/10/2020 11:15 AM Truett Mainland, DO [facility] None  12/17/2020  2:30 PM Lavonia Drafts, MD [facility] None  12/24/2020 11:15 AM Truett Mainland, DO [facility] None  01/14/2021  9:10 AM Shamleffer, Melanie Crazier, MD [facility] None    Truett Mainland, DO

## 2020-11-26 ENCOUNTER — Ambulatory Visit (INDEPENDENT_AMBULATORY_CARE_PROVIDER_SITE_OTHER): Payer: 59 | Admitting: Family Medicine

## 2020-11-26 ENCOUNTER — Other Ambulatory Visit (HOSPITAL_COMMUNITY)
Admission: RE | Admit: 2020-11-26 | Discharge: 2020-11-26 | Disposition: A | Discharge status: Home / Self Care | Payer: 59 | Source: Ambulatory Visit | Attending: Family Medicine | Admitting: Family Medicine

## 2020-11-26 ENCOUNTER — Other Ambulatory Visit: Payer: Self-pay

## 2020-11-26 VITALS — BP 121/77 | HR 92 | Wt 185.0 lb

## 2020-11-26 DIAGNOSIS — O3663X0 Maternal care for excessive fetal growth, third trimester, not applicable or unspecified: Secondary | ICD-10-CM

## 2020-11-26 DIAGNOSIS — Z3480 Encounter for supervision of other normal pregnancy, unspecified trimester: Secondary | ICD-10-CM | POA: Insufficient documentation

## 2020-11-26 DIAGNOSIS — Z8759 Personal history of other complications of pregnancy, childbirth and the puerperium: Secondary | ICD-10-CM

## 2020-11-26 DIAGNOSIS — E041 Nontoxic single thyroid nodule: Secondary | ICD-10-CM

## 2020-11-26 DIAGNOSIS — O09299 Supervision of pregnancy with other poor reproductive or obstetric history, unspecified trimester: Secondary | ICD-10-CM

## 2020-11-26 NOTE — Progress Notes (Signed)
   PRENATAL VISIT NOTE  Subjective:  Megan Webb is a 25 y.o. G3P2002 at [redacted]w[redacted]d being seen today for ongoing prenatal care.  She is currently monitored for the following issues for this low-risk pregnancy and has Benzodiazepine (tranquilizer) overdose; History of suicide attempt; Anxiety; Cannabis abuse; H/O pre-eclampsia in prior pregnancy, currently pregnant; Thyroid nodule; Umbilical hernia; Anemia; Migraine without aura and without status migrainosus, not intractable; Supervision of other normal pregnancy, antepartum; History of gestational hypertension; and Fetal macrosomia in third trimester on their problem list.  Patient reports no complaints.  Contractions: Irritability. Vag. Bleeding: None.  Movement: Present. Denies leaking of fluid.   The following portions of the patient's history were reviewed and updated as appropriate: allergies, current medications, past family history, past medical history, past social history, past surgical history and problem list.   Objective:   Vitals:   11/26/20 1119  BP: 121/77  Pulse: 92  Weight: 185 lb (83.9 kg)    Fetal Status: Fetal Heart Rate (bpm): 148   Movement: Present     General:  Alert, oriented and cooperative. Patient is in no acute distress.  Skin: Skin is warm and dry. No rash noted.   Cardiovascular: Normal heart rate noted  Respiratory: Normal respiratory effort, no problems with respiration noted  Abdomen: Soft, gravid, appropriate for gestational age.  Pain/Pressure: Present     Pelvic: Cervical exam deferred        Extremities: Normal range of motion.  Edema: Trace  Mental Status: Normal mood and affect. Normal behavior. Normal judgment and thought content.   Assessment and Plan:  Pregnancy: G3P2002 at [redacted]w[redacted]d 1. Supervision of other normal pregnancy, antepartum FHT and FH normal - Culture, beta strep (group b only) - GC/Chlamydia probe amp ([place])not at [facility]  2. History of gestational hypertension 2hr  GTT normal  3. H/O pre-eclampsia in prior pregnancy, currently pregnant BP normal  4. Fetal macrosomia in third trimester, single or unspecified fetus Induce at 39 week  5. Thyroid nodule   Preterm labor symptoms and general obstetric precautions including but not limited to vaginal bleeding, contractions, leaking of fluid and fetal movement were reviewed in detail with the patient. Please refer to After Visit Summary for other counseling recommendations.   No follow-ups on file.  Future Appointments  Date Time Provider [place]  12/03/2020 11:15 AM Truett Mainland, DO [facility] None  12/04/2020  8:30 AM [facility] NURSE [facility]  12/04/2020  8:45 AM [facility]-MFC US4 [facility]  12/05/2020  9:00 AM MHP-ECHO 1 MHP-ECHO [facility]  12/10/2020 11:15 AM Truett Mainland, DO [facility] None  12/17/2020  2:30 PM Lavonia Drafts, MD [facility] None  12/24/2020 11:15 AM Truett Mainland, DO [facility] None  01/14/2021  9:10 AM Shamleffer, Melanie Crazier, MD [facility] None    Truett Mainland, DO

## 2020-11-27 LAB — GC/CHLAMYDIA PROBE AMP (~~LOC~~) NOT AT ARMC
Chlamydia: NEGATIVE
Comment: NEGATIVE
Comment: NORMAL
Neisseria Gonorrhea: NEGATIVE

## 2020-11-30 LAB — CULTURE, BETA STREP (GROUP B ONLY): Strep Gp B Culture: NEGATIVE

## 2020-12-03 ENCOUNTER — Ambulatory Visit (INDEPENDENT_AMBULATORY_CARE_PROVIDER_SITE_OTHER): Payer: Medicaid Other | Admitting: Family Medicine

## 2020-12-03 ENCOUNTER — Other Ambulatory Visit: Payer: Self-pay

## 2020-12-03 VITALS — BP 122/82 | HR 97 | Wt 185.0 lb

## 2020-12-03 DIAGNOSIS — O09299 Supervision of pregnancy with other poor reproductive or obstetric history, unspecified trimester: Secondary | ICD-10-CM

## 2020-12-03 DIAGNOSIS — Z8759 Personal history of other complications of pregnancy, childbirth and the puerperium: Secondary | ICD-10-CM

## 2020-12-03 DIAGNOSIS — O3663X0 Maternal care for excessive fetal growth, third trimester, not applicable or unspecified: Secondary | ICD-10-CM

## 2020-12-03 DIAGNOSIS — Z3480 Encounter for supervision of other normal pregnancy, unspecified trimester: Secondary | ICD-10-CM

## 2020-12-03 NOTE — Progress Notes (Signed)
   PRENATAL VISIT NOTE  Subjective:  Megan Webb is a 25 y.o. G3P2002 at [redacted]w[redacted]d being seen today for ongoing prenatal care.  She is currently monitored for the following issues for this high-risk pregnancy and has Benzodiazepine (tranquilizer) overdose; History of suicide attempt; Anxiety; Cannabis abuse; H/O pre-eclampsia in prior pregnancy, currently pregnant; Thyroid nodule; Umbilical hernia; Anemia; Migraine without aura and without status migrainosus, not intractable; Supervision of other normal pregnancy, antepartum; History of gestational hypertension; and Fetal macrosomia in third trimester on their problem list.  Patient reports occasional contractions.  Contractions: Irritability. Vag. Bleeding: None.  Movement: Present. Denies leaking of fluid.   The following portions of the patient's history were reviewed and updated as appropriate: allergies, current medications, past family history, past medical history, past social history, past surgical history and problem list.   Objective:   Vitals:   12/03/20 1120  BP: 122/82  Pulse: 97  Weight: 185 lb (83.9 kg)    Fetal Status: Fetal Heart Rate (bpm): 156   Movement: Present     General:  Alert, oriented and cooperative. Patient is in no acute distress.  Skin: Skin is warm and dry. No rash noted.   Cardiovascular: Normal heart rate noted  Respiratory: Normal respiratory effort, no problems with respiration noted  Abdomen: Soft, gravid, appropriate for gestational age.  Pain/Pressure: Present     Pelvic: Cervical exam performed in the presence of a chaperone        Extremities: Normal range of motion.  Edema: Trace  Mental Status: Normal mood and affect. Normal behavior. Normal judgment and thought content.   Assessment and Plan:  Pregnancy: G3P2002 at [redacted]w[redacted]d 1. Supervision of other normal pregnancy, antepartum FHT normal  2. Fetal macrosomia in third trimester, single or unspecified fetus Induction at 39 weeks  3.  History of gestational hypertension BP normal  4. H/O pre-eclampsia in prior pregnancy, currently pregnant   Term labor symptoms and general obstetric precautions including but not limited to vaginal bleeding, contractions, leaking of fluid and fetal movement were reviewed in detail with the patient. Please refer to After Visit Summary for other counseling recommendations.   No follow-ups on file.  Future Appointments  Date Time Provider [place]  12/04/2020  8:30 AM [facility] NURSE [facility]  12/04/2020  8:45 AM [facility] US4 [facility]  12/05/2020  9:00 AM [facility]ECHO 1 [facility]ECHO [facility]  12/10/2020 11:15 AM Truett Mainland, DO [facility] None  12/16/2020  7:00 AM MC-LD [place] [facility]-IN[facility] None  01/14/2021  9:10 AM Shamleffer, Melanie Crazier, MD [facility] None  01/22/2021  1:50 PM Truett Mainland, DO [facility] None    Truett Mainland, DO

## 2020-12-04 ENCOUNTER — Ambulatory Visit: Payer: Managed Care, Other (non HMO) | Admitting: *Deleted

## 2020-12-04 ENCOUNTER — Encounter: Payer: Self-pay | Admitting: *Deleted

## 2020-12-04 ENCOUNTER — Ambulatory Visit: Payer: Managed Care, Other (non HMO) | Attending: Obstetrics and Gynecology

## 2020-12-04 VITALS — BP 121/88 | HR 98

## 2020-12-04 DIAGNOSIS — Z8759 Personal history of other complications of pregnancy, childbirth and the puerperium: Secondary | ICD-10-CM | POA: Insufficient documentation

## 2020-12-04 DIAGNOSIS — Z3480 Encounter for supervision of other normal pregnancy, unspecified trimester: Secondary | ICD-10-CM

## 2020-12-04 DIAGNOSIS — O3663X0 Maternal care for excessive fetal growth, third trimester, not applicable or unspecified: Secondary | ICD-10-CM | POA: Diagnosis present

## 2020-12-04 DIAGNOSIS — Z3A37 37 weeks gestation of pregnancy: Secondary | ICD-10-CM

## 2020-12-04 DIAGNOSIS — O99343 Other mental disorders complicating pregnancy, third trimester: Secondary | ICD-10-CM

## 2020-12-05 ENCOUNTER — Other Ambulatory Visit: Payer: Self-pay

## 2020-12-05 ENCOUNTER — Ambulatory Visit (HOSPITAL_BASED_OUTPATIENT_CLINIC_OR_DEPARTMENT_OTHER)
Admission: RE | Admit: 2020-12-05 | Discharge: 2020-12-05 | Disposition: A | Discharge status: Home / Self Care | Payer: Managed Care, Other (non HMO) | Source: Ambulatory Visit | Attending: Family Medicine | Admitting: Family Medicine

## 2020-12-05 DIAGNOSIS — R002 Palpitations: Secondary | ICD-10-CM | POA: Diagnosis not present

## 2020-12-05 DIAGNOSIS — I340 Nonrheumatic mitral (valve) insufficiency: Secondary | ICD-10-CM

## 2020-12-05 LAB — ECHOCARDIOGRAM COMPLETE
AR max vel: 2.45 cm2
AV Area VTI: 2.39 cm2
AV Area mean vel: 2.55 cm2
AV Mean grad: 4 mmHg
AV Peak grad: 7.6 mmHg
Ao pk vel: 1.38 m/s
Area-P 1/2: 5.06 cm2
Calc EF: 56.4 %
S' Lateral: 3 cm
Single Plane A2C EF: 55 %
Single Plane A4C EF: 58.5 %

## 2020-12-09 ENCOUNTER — Other Ambulatory Visit: Payer: Self-pay | Admitting: Advanced Practice Midwife

## 2020-12-10 ENCOUNTER — Other Ambulatory Visit: Payer: Self-pay

## 2020-12-10 ENCOUNTER — Ambulatory Visit (INDEPENDENT_AMBULATORY_CARE_PROVIDER_SITE_OTHER): Payer: Medicaid Other | Admitting: Family Medicine

## 2020-12-10 VITALS — BP 121/77 | HR 83 | Wt 188.0 lb

## 2020-12-10 DIAGNOSIS — O09299 Supervision of pregnancy with other poor reproductive or obstetric history, unspecified trimester: Secondary | ICD-10-CM

## 2020-12-10 DIAGNOSIS — Z3A38 38 weeks gestation of pregnancy: Secondary | ICD-10-CM

## 2020-12-10 DIAGNOSIS — Z3480 Encounter for supervision of other normal pregnancy, unspecified trimester: Secondary | ICD-10-CM

## 2020-12-10 DIAGNOSIS — O3663X0 Maternal care for excessive fetal growth, third trimester, not applicable or unspecified: Secondary | ICD-10-CM

## 2020-12-10 NOTE — Progress Notes (Signed)
   PRENATAL VISIT NOTE  Subjective:  Megan Webb is a 25 y.o. G3P2002 at [redacted]w[redacted]d being seen today for ongoing prenatal care.  She is currently monitored for the following issues for this high-risk pregnancy and has Benzodiazepine (tranquilizer) overdose; History of suicide attempt; Anxiety; Cannabis abuse; H/O pre-eclampsia in prior pregnancy, currently pregnant; Thyroid nodule; Umbilical hernia; Anemia; Migraine without aura and without status migrainosus, not intractable; Supervision of other normal pregnancy, antepartum; History of gestational hypertension; and Fetal macrosomia in third trimester on their problem list.  Patient reports occasional contractions.  Contractions: Irregular. Vag. Bleeding: None.  Movement: Present. Denies leaking of fluid.   The following portions of the patient's history were reviewed and updated as appropriate: allergies, current medications, past family history, past medical history, past social history, past surgical history and problem list.   Objective:   Vitals:   12/10/20 1122  BP: 121/77  Pulse: 83  Weight: 188 lb (85.3 kg)    Fetal Status: Fetal Heart Rate (bpm): 150 Fundal Height: 40 cm Movement: Present     General:  Alert, oriented and cooperative. Patient is in no acute distress.  Skin: Skin is warm and dry. No rash noted.   Cardiovascular: Normal heart rate noted  Respiratory: Normal respiratory effort, no problems with respiration noted  Abdomen: Soft, gravid, appropriate for gestational age.  Pain/Pressure: Absent     Pelvic: Cervical exam deferred        Extremities: Normal range of motion.  Edema: Trace  Mental Status: Normal mood and affect. Normal behavior. Normal judgment and thought content.   Assessment and Plan:  Pregnancy: G3P2002 at [redacted]w[redacted]d 1. [redacted] weeks gestation of pregnancy  2. Supervision of other normal pregnancy, antepartum FHT and FH normal  3. Fetal macrosomia in third trimester, single or unspecified  fetus Elective induction at 39 weeks for fetal macrosomia.  4. H/O pre-eclampsia in prior pregnancy, currently pregnant BP normal. ASA 81mg   Term labor symptoms and general obstetric precautions including but not limited to vaginal bleeding, contractions, leaking of fluid and fetal movement were reviewed in detail with the patient. Please refer to After Visit Summary for other counseling recommendations.   No follow-ups on file.  Future Appointments  Date Time Provider [place]  12/16/2020  7:00 AM MC-LD [place] [facility]-IN[facility] None  01/14/2021  9:10 AM Shamleffer, Melanie Crazier, MD [facility] None  01/22/2021  1:50 PM Truett Mainland, DO [facility] None    Truett Mainland, DO

## 2020-12-15 ENCOUNTER — Other Ambulatory Visit: Payer: Self-pay

## 2020-12-15 ENCOUNTER — Inpatient Hospital Stay (HOSPITAL_COMMUNITY): Payer: Managed Care, Other (non HMO) | Admitting: Anesthesiology

## 2020-12-15 ENCOUNTER — Inpatient Hospital Stay (HOSPITAL_COMMUNITY)
Admission: AD | Admit: 2020-12-15 | Discharge: 2020-12-16 | DRG: 807 | Disposition: A | Discharge status: Home / Self Care | Payer: Managed Care, Other (non HMO) | Attending: Family Medicine | Admitting: Family Medicine

## 2020-12-15 ENCOUNTER — Encounter (HOSPITAL_COMMUNITY): Payer: Self-pay | Admitting: Family Medicine

## 2020-12-15 DIAGNOSIS — O1404 Mild to moderate pre-eclampsia, complicating childbirth: Principal | ICD-10-CM | POA: Diagnosis present

## 2020-12-15 DIAGNOSIS — Z3A38 38 weeks gestation of pregnancy: Secondary | ICD-10-CM

## 2020-12-15 DIAGNOSIS — O3663X0 Maternal care for excessive fetal growth, third trimester, not applicable or unspecified: Secondary | ICD-10-CM | POA: Diagnosis present

## 2020-12-15 DIAGNOSIS — Z8759 Personal history of other complications of pregnancy, childbirth and the puerperium: Secondary | ICD-10-CM

## 2020-12-15 DIAGNOSIS — O1400 Mild to moderate pre-eclampsia, unspecified trimester: Secondary | ICD-10-CM | POA: Diagnosis present

## 2020-12-15 DIAGNOSIS — Z20822 Contact with and (suspected) exposure to covid-19: Secondary | ICD-10-CM | POA: Diagnosis present

## 2020-12-15 DIAGNOSIS — Z7982 Long term (current) use of aspirin: Secondary | ICD-10-CM

## 2020-12-15 DIAGNOSIS — O26893 Other specified pregnancy related conditions, third trimester: Secondary | ICD-10-CM | POA: Diagnosis present

## 2020-12-15 DIAGNOSIS — Z3480 Encounter for supervision of other normal pregnancy, unspecified trimester: Secondary | ICD-10-CM

## 2020-12-15 LAB — COMPREHENSIVE METABOLIC PANEL
ALT: 12 U/L (ref 0–44)
AST: 21 U/L (ref 15–41)
Albumin: 2.8 g/dL — ABNORMAL LOW (ref 3.5–5.0)
Alkaline Phosphatase: 224 U/L — ABNORMAL HIGH (ref 38–126)
Anion gap: 12 (ref 5–15)
BUN: 5 mg/dL — ABNORMAL LOW (ref 6–20)
CO2: 16 mmol/L — ABNORMAL LOW (ref 22–32)
Calcium: 9.2 mg/dL (ref 8.9–10.3)
Chloride: 108 mmol/L (ref 98–111)
Creatinine, Ser: 0.52 mg/dL (ref 0.44–1.00)
GFR, Estimated: >60 mL/min (ref >60)
Glucose, Bld: 105 mg/dL — ABNORMAL HIGH (ref 70–99)
Potassium: 4.2 mmol/L (ref 3.5–5.1)
Sodium: 136 mmol/L (ref 135–145)
Total Bilirubin: 0.5 mg/dL (ref 0.3–1.2)
Total Protein: 5.8 g/dL — ABNORMAL LOW (ref 6.5–8.1)

## 2020-12-15 LAB — RESP PANEL BY RT-PCR (FLU A&B, COVID) ARPGX2
Influenza A by PCR: NEGATIVE
Influenza B by PCR: NEGATIVE
SARS Coronavirus 2 by RT PCR: NEGATIVE

## 2020-12-15 LAB — TYPE AND SCREEN
ABO/RH(D): A POS
Antibody Screen: NEGATIVE

## 2020-12-15 LAB — CBC
HCT: 33.3 % — ABNORMAL LOW (ref 36.0–46.0)
Hemoglobin: 10.5 g/dL — ABNORMAL LOW (ref 12.0–15.0)
MCH: 25.5 pg — ABNORMAL LOW (ref 26.0–34.0)
MCHC: 31.5 g/dL (ref 30.0–36.0)
MCV: 80.8 fL (ref 80.0–100.0)
Platelets: 184 10*3/uL (ref 150–400)
RBC: 4.12 MIL/uL (ref 3.87–5.11)
RDW: 17.5 % — ABNORMAL HIGH (ref 11.5–15.5)
WBC: 6.4 10*3/uL (ref 4.0–10.5)
nRBC: 0 % (ref 0.0–0.2)

## 2020-12-15 LAB — RPR: RPR Ser Ql: NONREACTIVE

## 2020-12-15 LAB — PROTEIN / CREATININE RATIO, URINE
Creatinine, Urine: 141.91 mg/dL
Protein Creatinine Ratio: 0.58 mg/mg{Cre} — ABNORMAL HIGH (ref 0.00–0.15)
Total Protein, Urine: 83 mg/dL

## 2020-12-15 MED ORDER — COCONUT OIL OIL: 1.0000 "application " | TOPICAL_OIL | Status: DC | PRN

## 2020-12-15 MED ORDER — SIMETHICONE 80 MG PO CHEW: 80.0000 mg | CHEWABLE_TABLET | ORAL | Status: DC | PRN

## 2020-12-15 MED ORDER — IBUPROFEN 600 MG PO TABS
600.0000 mg | ORAL_TABLET | Freq: Four times a day (QID) | ORAL | Status: DC
  Administered 2020-12-15 – 2020-12-16 (×5): 600 mg via ORAL
  Filled 2020-12-15 (×6): qty 1

## 2020-12-15 MED ORDER — TETANUS-DIPHTH-ACELL PERTUSSIS 5-2.5-18.5 LF-MCG/0.5 IM SUSY: 0.5000 mL | PREFILLED_SYRINGE | Freq: Once | INTRAMUSCULAR | Status: DC

## 2020-12-15 MED ORDER — SOD CITRATE-CITRIC ACID 500-334 MG/5ML PO SOLN: 30.0000 mL | ORAL | Status: DC | PRN

## 2020-12-15 MED ORDER — TERBUTALINE SULFATE 1 MG/ML IJ SOLN: 0.2500 mg | Freq: Once | INTRAMUSCULAR | Status: DC | PRN

## 2020-12-15 MED ORDER — ACETAMINOPHEN 325 MG PO TABS
650.0000 mg | ORAL_TABLET | ORAL | Status: DC | PRN
  Administered 2020-12-15: 650 mg via ORAL
  Filled 2020-12-15: qty 2

## 2020-12-15 MED ORDER — LIDOCAINE HCL (PF) 1 % IJ SOLN
INTRAMUSCULAR | Status: DC | PRN
  Administered 2020-12-15: 6 mL via EPIDURAL

## 2020-12-15 MED ORDER — DIBUCAINE (PERIANAL) 1 % EX OINT
1.0000 "application " | TOPICAL_OINTMENT | CUTANEOUS | Status: DC | PRN
  Administered 2020-12-15: 1 via RECTAL
  Filled 2020-12-15: qty 28

## 2020-12-15 MED ORDER — FENTANYL-BUPIVACAINE-NACL 0.5-0.125-0.9 MG/250ML-% EP SOLN
EPIDURAL | Status: AC
  Filled 2020-12-15: qty 250

## 2020-12-15 MED ORDER — PHENYLEPHRINE 40 MCG/ML (10ML) SYRINGE FOR IV PUSH (FOR BLOOD PRESSURE SUPPORT): 80.0000 ug | PREFILLED_SYRINGE | INTRAVENOUS | Status: DC | PRN

## 2020-12-15 MED ORDER — DIPHENHYDRAMINE HCL 50 MG/ML IJ SOLN: 12.5000 mg | INTRAMUSCULAR | Status: DC | PRN

## 2020-12-15 MED ORDER — EPHEDRINE 5 MG/ML INJ: 10.0000 mg | INTRAVENOUS | Status: DC | PRN

## 2020-12-15 MED ORDER — PRENATAL MULTIVITAMIN CH
1.0000 | ORAL_TABLET | Freq: Every day | ORAL | Status: DC
  Administered 2020-12-15 – 2020-12-16 (×2): 1 via ORAL
  Filled 2020-12-15 (×2): qty 1

## 2020-12-15 MED ORDER — LACTATED RINGERS IV SOLN: 500.0000 mL | INTRAVENOUS | Status: DC | PRN

## 2020-12-15 MED ORDER — ONDANSETRON HCL 4 MG PO TABS: 4.0000 mg | ORAL_TABLET | ORAL | Status: DC | PRN

## 2020-12-15 MED ORDER — MISOPROSTOL 50MCG HALF TABLET: 50.0000 ug | ORAL_TABLET | ORAL | Status: DC | PRN

## 2020-12-15 MED ORDER — LACTATED RINGERS IV SOLN
500.0000 mL | Freq: Once | INTRAVENOUS | Status: AC
  Administered 2020-12-15: 500 mL via INTRAVENOUS

## 2020-12-15 MED ORDER — DIPHENHYDRAMINE HCL 25 MG PO CAPS: 25.0000 mg | ORAL_CAPSULE | Freq: Four times a day (QID) | ORAL | Status: DC | PRN

## 2020-12-15 MED ORDER — ACETAMINOPHEN 325 MG PO TABS: 650.0000 mg | ORAL_TABLET | ORAL | Status: DC | PRN

## 2020-12-15 MED ORDER — ONDANSETRON HCL 4 MG/2ML IJ SOLN: 4.0000 mg | INTRAMUSCULAR | Status: DC | PRN

## 2020-12-15 MED ORDER — MEDROXYPROGESTERONE ACETATE 150 MG/ML IM SUSP: 150.0000 mg | INTRAMUSCULAR | Status: DC | PRN

## 2020-12-15 MED ORDER — PHENYLEPHRINE 40 MCG/ML (10ML) SYRINGE FOR IV PUSH (FOR BLOOD PRESSURE SUPPORT)
PREFILLED_SYRINGE | INTRAVENOUS | Status: AC
  Filled 2020-12-15: qty 10

## 2020-12-15 MED ORDER — NIFEDIPINE ER OSMOTIC RELEASE 30 MG PO TB24
30.0000 mg | ORAL_TABLET | Freq: Every day | ORAL | Status: DC
  Administered 2020-12-15 – 2020-12-16 (×2): 30 mg via ORAL
  Filled 2020-12-15 (×2): qty 1

## 2020-12-15 MED ORDER — WITCH HAZEL-GLYCERIN EX PADS
1.0000 "application " | MEDICATED_PAD | CUTANEOUS | Status: DC | PRN
  Administered 2020-12-15: 1 via TOPICAL

## 2020-12-15 MED ORDER — LIDOCAINE HCL (PF) 1 % IJ SOLN: 30.0000 mL | INTRAMUSCULAR | Status: DC | PRN

## 2020-12-15 MED ORDER — LACTATED RINGERS IV SOLN: INTRAVENOUS | Status: DC

## 2020-12-15 MED ORDER — FENTANYL CITRATE (PF) 100 MCG/2ML IJ SOLN
100.0000 ug | INTRAMUSCULAR | Status: DC | PRN
  Administered 2020-12-15: 100 ug via INTRAVENOUS
  Filled 2020-12-15: qty 2

## 2020-12-15 MED ORDER — OXYCODONE-ACETAMINOPHEN 5-325 MG PO TABS: 1.0000 | ORAL_TABLET | ORAL | Status: DC | PRN

## 2020-12-15 MED ORDER — BENZOCAINE-MENTHOL 20-0.5 % EX AERO
1.0000 "application " | INHALATION_SPRAY | CUTANEOUS | Status: DC | PRN
  Administered 2020-12-15: 1 via TOPICAL
  Filled 2020-12-15: qty 56

## 2020-12-15 MED ORDER — OXYTOCIN BOLUS FROM INFUSION
333.0000 mL | Freq: Once | INTRAVENOUS | Status: AC
  Administered 2020-12-15: 333 mL via INTRAVENOUS

## 2020-12-15 MED ORDER — OXYCODONE-ACETAMINOPHEN 5-325 MG PO TABS: 2.0000 | ORAL_TABLET | ORAL | Status: DC | PRN

## 2020-12-15 MED ORDER — FENTANYL-BUPIVACAINE-NACL 0.5-0.125-0.9 MG/250ML-% EP SOLN
12.0000 mL/h | EPIDURAL | Status: DC | PRN
  Administered 2020-12-15: 12 mL/h via EPIDURAL

## 2020-12-15 MED ORDER — MEASLES, MUMPS & RUBELLA VAC IJ SOLR: 0.5000 mL | Freq: Once | INTRAMUSCULAR | Status: DC

## 2020-12-15 MED ORDER — ONDANSETRON HCL 4 MG/2ML IJ SOLN: 4.0000 mg | Freq: Four times a day (QID) | INTRAMUSCULAR | Status: DC | PRN

## 2020-12-15 MED ORDER — SENNOSIDES-DOCUSATE SODIUM 8.6-50 MG PO TABS
2.0000 | ORAL_TABLET | Freq: Every day | ORAL | Status: DC
  Administered 2020-12-15: 2 via ORAL
  Filled 2020-12-15: qty 2

## 2020-12-15 MED ORDER — OXYTOCIN-SODIUM CHLORIDE 30-0.9 UT/500ML-% IV SOLN
2.5000 [IU]/h | INTRAVENOUS | Status: DC
  Administered 2020-12-15: 2.5 [IU]/h via INTRAVENOUS
  Filled 2020-12-15: qty 500

## 2020-12-15 NOTE — H&P (Addendum)
OBSTETRIC ADMISSION HISTORY AND PHYSICAL  Megan Webb is a 25 y.o. female G3P2002 with IUP at [redacted]w[redacted]d by [place] presenting for onset of labor. She reports +FMs, No LOF, no VB, no blurry vision, headaches or peripheral edema, and RUQ pain.  She plans on breastfeeding. She requests POPs for birth control. She received her prenatal care at [place]: By [place] --->  Estimated Date of Delivery: 12/23/20  Sono:  @[redacted]w[redacted]d , CWD, normal anatomy, cephalic presentation, anterior placenta,4246g, >99% EFW   Prenatal History/Complications: Hypothyroidism, anxiety, h/o pre-E, h/o gestational HTN, cannabis use  Past Medical History: Past Medical History:  Diagnosis Date   Anemia    on Iron supplements   Anxiety    in the past; not current   Depression    in the past; not current   Headache    has migraines, takes Extra Strength Tylenol with minimal relief   Hyperthyroidism    nodule on thyroid; but not diagnosed with hyperthyroidism   Umbilical hernia 5366    Past Surgical History: Past Surgical History:  Procedure Laterality Date   BUNIONECTOMY      Obstetrical History: OB History     Gravida  3   Para  2   Term  2   Preterm  0   AB  0   Living  2      SAB  0   IAB  0   Ectopic  0   Multiple      Live Births  2           Social History Social History   Socioeconomic History   Marital status: Married    Spouse name: Science writer   Number of children: 2   Years of education: Not on file   Highest education level: Not on file  Occupational History   Not on file  Tobacco Use   Smoking status: Never   Smokeless tobacco: Never  Vaping Use   Vaping Use: Never used  Substance and Sexual Activity   Alcohol use: No    Comment: occ   Drug use: Not Currently    Frequency: 1.0 times per week    Types: Marijuana    Comment: not while preg   Sexual activity: Yes    Birth control/protection: None  Other Topics Concern   Not on file  Social History  Narrative   ** Merged History Encounter **       Social Determinants of Health   Financial Resource Strain: Not on file  Food Insecurity: Not on file  Transportation Needs: Not on file  Physical Activity: Not on file  Stress: Not on file  Social Connections: Not on file    Family History: Family History  Problem Relation Age of Onset   Mental illness Mother        anxiety and depression   Hypertension Mother    Kidney disease Mother    Anemia Mother    Mental illness Father    Cataracts Father    Asthma Brother    Diabetes Maternal Aunt    Cancer Maternal Aunt        breast   Diabetes Maternal Uncle    Arthritis Maternal Grandmother    Stroke Maternal Grandmother    Hypertension Maternal Grandmother    Arthritis Maternal Grandfather    Hypertension Maternal Grandfather    Stroke Maternal Grandfather    Arthritis Paternal Grandfather    Stroke Paternal Grandfather     Allergies: No Known  Allergies  Medications Prior to Admission  Medication Sig Dispense Refill Last Dose   aspirin 81 MG chewable tablet Chew 1 tablet (81 mg total) by mouth daily. 90 tablet 1    ferrous sulfate 325 (65 FE) MG tablet Take 325 mg by mouth daily with breakfast.      hydrOXYzine (ATARAX/VISTARIL) 25 MG tablet Take 1 tablet (25 mg total) by mouth as needed. 30 tablet 2    Prenatal Vit-Fe Fumarate-FA (PRENATAL VITAMINS PO) Take by mouth.        Review of Systems   All systems reviewed and negative except as stated in HPI  Blood pressure (!) 149/61, pulse 89, temperature 98.2 F (36.8 C), temperature source Oral, resp. rate 18, height 5\' 3"  (1.6 m), weight 85.3 kg, last menstrual period 03/04/2020, SpO2 99 %. General appearance: alert, cooperative, and uncomfortable with contractions Lungs: clear to auscultation bilaterally Heart: regular rate and rhythm Abdomen: soft, non-tender; bowel sounds normal Extremities: Homans sign is negative, no sign of DVT Presentation: cephalic Fetal  monitoringBaseline: 150 bpm, Variability: moderate, Accelerations: Reactive, and Decelerations: Absent Uterine activityFrequency: Every 1-2 minutes Dilation: 7.5 Effacement (%): 90 Station: -1 Exam by:: Cletus Gash, RN   Prenatal labs: ABO, Rh: --/--/A POS (11/21 0235) Antibody: NEG (11/21 0235) Rubella: 3.26 (04/26 1003) RPR: Non Reactive (09/02 0936)  HBsAg: Negative (04/26 1003)  HIV: Non Reactive (09/02 0936)  GBS: Negative/-- (11/02 1139)  2 hour Glucoa normal Genetic screening  Negative, low-risk Anatomy US Normal  Prenatal Transfer Tool  Maternal Diabetes: No Genetic Screening: Normal Maternal Ultrasounds/Referrals: Other: Fetal Ultrasounds or other Referrals:  None Maternal Substance Abuse:  No Significant Maternal Medications:  ASA, Hydroxyzine Significant Maternal Lab Results: Group B Strep negative  Results for orders placed or performed during the hospital encounter of 12/15/20 (from the past 24 hour(s))  Type and screen   Collection Time: 12/15/20  2:35 AM  Result Value Ref Range   ABO/RH(D) A POS    Antibody Screen NEG    Sample Expiration      12/18/2020,2359 Performed at [facility], [place] [address] [place], [place] [address]   CBC   Collection Time: 12/15/20  2:36 AM  Result Value Ref Range   WBC 6.4 4.0 - 10.5 K/uL   RBC 4.12 3.87 - 5.11 MIL/uL   Hemoglobin 10.5 (L) 12.0 - 15.0 g/dL   HCT 33.3 (L) 36.0 - 46.0 %   MCV 80.8 80.0 - 100.0 fL   MCH 25.5 (L) 26.0 - 34.0 pg   MCHC 31.5 30.0 - 36.0 g/dL   RDW 17.5 (H) 11.5 - 15.5 %   Platelets 184 150 - 400 K/uL   nRBC 0.0 0.0 - 0.2 %  Resp Panel by RT-PCR (Flu A&B, Covid) Nasopharyngeal Swab   Collection Time: 12/15/20  2:46 AM   Specimen: Nasopharyngeal Swab; Nasopharyngeal(NP) swabs in vial transport medium  Result Value Ref Range   SARS Coronavirus 2 by RT PCR NEGATIVE NEGATIVE   Influenza A by PCR NEGATIVE NEGATIVE   Influenza B by PCR NEGATIVE NEGATIVE  Comprehensive metabolic  panel   Collection Time: 12/15/20  3:21 AM  Result Value Ref Range   Sodium 136 135 - 145 mmol/L   Potassium 4.2 3.5 - 5.1 mmol/L   Chloride 108 98 - 111 mmol/L   CO2 16 (L) 22 - 32 mmol/L   Glucose, Bld 105 (H) 70 - 99 mg/dL   BUN 5 (L) 6 - 20 mg/dL   Creatinine, Ser  0.52 0.44 - 1.00 mg/dL   Calcium 9.2 8.9 - 10.3 mg/dL   Total Protein 5.8 (L) 6.5 - 8.1 g/dL   Albumin 2.8 (L) 3.5 - 5.0 g/dL   AST 21 15 - 41 U/L   ALT 12 0 - 44 U/L   Alkaline Phosphatase 224 (H) 38 - 126 U/L   Total Bilirubin 0.5 0.3 - 1.2 mg/dL   GFR, Estimated >60 >60 mL/min   Anion gap 12 5 - 15    Patient Active Problem List   Diagnosis Date Noted   Encounter for elective induction of labor 12/15/2020   Fetal macrosomia in third trimester 11/26/2020   Supervision of other normal pregnancy, antepartum 05/20/2020   History of gestational hypertension 05/20/2020   Migraine without aura and without status migrainosus, not intractable 04/14/2019   Anemia 88/89/1694   Umbilical hernia 50/38/8828   H/O pre-eclampsia in prior pregnancy, currently pregnant 08/19/2017   Thyroid nodule 08/19/2017   Cannabis abuse 08/16/2011   History of suicide attempt 08/15/2011   Anxiety 08/15/2011   Benzodiazepine (tranquilizer) overdose 08/14/2011    Assessment/Plan:  Cailah Reach is a 25 y.o. G3P2002 at [redacted]w[redacted]d here for active labor.  #Labor:Progressing well- feeling contractions intensely and increasing pressure. Expectant management. #Pain:  Receiving epidural #FWB: Cat 1 #ID:  GBS negative #MOF: Breast #MOC:POPs #Circ:  Yes  #gHTN and hx of pre-E: A few elevated BP, but mostly normotensive. Will monitor closely  Orvis Brill, DO  12/15/2020, 3:52 AM  CNM attestation:  I have seen and examined this patient; I agree with above documentation in the resident's note.   Megan Webb is a 25 y.o. G3P2002 here for SOL; most recent EFW 9+6 on 11/10.  PE: BP (!) 154/81   Pulse 100   Temp  98.2 F (36.8 C) (Oral)   Resp 18   Ht 5\' 3"  (1.6 m)   Wt 85.3 kg   LMP 03/04/2020   SpO2 100%   BMI 33.30 kg/m  Gen: breathing w ctx Resp: normal effort, no distress Abd: gravid  ROS, labs, PMH reviewed  Plan: Admit to Labor and Delivery Continue watching BPs- most likely has gHTN; neg bloodwork; awaiting urine p/c ratio Expectant management Be prepared for SD at delivery  [place] 12/15/2020, 5:33 AM

## 2020-12-15 NOTE — MAU Note (Signed)
..  Megan Webb is a 25 y.o. at [redacted]w[redacted]d here in MAU reporting: contractions every 2 minutes. Denies vaginal bleeding or leaking of fluid.

## 2020-12-15 NOTE — Anesthesia Postprocedure Evaluation (Signed)
Anesthesia Post Note  Patient: Megan Webb  Procedure(s) Performed: AN AD HOC LABOR EPIDURAL     Patient location during evaluation: Mother Baby Anesthesia Type: Epidural Level of consciousness: awake and alert Pain management: pain level controlled Vital Signs Assessment: post-procedure vital signs reviewed and stable Respiratory status: spontaneous breathing, nonlabored ventilation and respiratory function stable Cardiovascular status: stable Postop Assessment: no headache, no backache and epidural receding Anesthetic complications: no   No notable events documented.  Last Vitals:  Vitals:   12/15/20 1000 12/15/20 1100  BP: 133/87 131/77  Pulse: 87 91  Resp: 18 18  Temp: 37.1 C 37.1 C  SpO2: 100%     Last Pain:  Vitals:   12/15/20 1100  TempSrc: Oral  PainSc:    Pain Goal:                   Jaxtin Raimondo

## 2020-12-15 NOTE — Anesthesia Preprocedure Evaluation (Signed)
Anesthesia Evaluation  Patient identified by MRN, date of birth, ID band Patient awake    Reviewed: Allergy & Precautions, NPO status , Patient's Chart, lab work & pertinent test results  Airway Mallampati: I  TM Distance: >3 FB Neck ROM: Full    Dental  (+) Teeth Intact, Dental Advisory Given   Pulmonary    breath sounds clear to auscultation       Cardiovascular hypertension,  Rhythm:Regular Rate:Normal     Neuro/Psych  Headaches, PSYCHIATRIC DISORDERS Anxiety Depression    GI/Hepatic   Endo/Other    Renal/GU      Musculoskeletal   Abdominal   Peds  Hematology  (+) anemia ,   Anesthesia Other Findings   Reproductive/Obstetrics (+) Pregnancy                             Anesthesia Physical  Anesthesia Plan  ASA: 2  Anesthesia Plan: Epidural   Post-op Pain Management:    Induction:   PONV Risk Score and Plan:   Airway Management Planned: Natural Airway  Additional Equipment:   Intra-op Plan:   Post-operative Plan:   Informed Consent: I have reviewed the patients History and Physical, chart, labs and discussed the procedure including the risks, benefits and alternatives for the proposed anesthesia with the patient or authorized representative who has indicated his/her understanding and acceptance.       Plan Discussed with: Anesthesiologist  Anesthesia Plan Comments:         Anesthesia Quick Evaluation

## 2020-12-15 NOTE — Progress Notes (Signed)
Pt with 2nd increased BP over 135/85.  Called MD to notify, currently in a repair from delivery. Instructed to call when finished.

## 2020-12-15 NOTE — Progress Notes (Signed)
Labor Progress Note Megan Webb is a 25 y.o. G3P2002 at [redacted]w[redacted]d presented for SOL.  S: Actively pushing and feeling intense contractions close together  O:  BP (!) 150/84   Pulse (!) 105   Temp 98.2 F (36.8 C) (Oral)   Resp 18   Ht 5\' 3"  (1.6 m)   Wt 85.3 kg   LMP 03/04/2020   SpO2 100%   BMI 33.30 kg/m   FHT Baseline FHR: 160 Variability: moderate Accelerations: present Decelerations: variables   CVE: Dilation: 10 Dilation Complete Date: 12/15/20 Dilation Complete Time: 0422 Effacement (%): 100 Cervical Position: Middle Station: 0, Plus 1 Presentation: Vertex Exam by:: Dr. Owens Shark   A&P: 25 y.o. G9M3014 [redacted]w[redacted]d  #Labor: Progressing well. SROM at 0530 with mec stained fluid. Complete and actively pushing, anticipate SVD soon.  #Pain: Epidural #FWB: Cat 1 #GBS negative  Orvis Brill, DO 5:32 AM

## 2020-12-15 NOTE — Lactation Note (Signed)
This note was copied from a baby's chart. Lactation Consultation Note  Patient Name: Boy Adra Shepler [id number] Date: 12/15/2020 Reason for consult: L&D Initial assessment;Early term 37-38.6wks Age:26 hours  Mom is a P3 who nursed her 1st two children for 2 yrs & 6 mo, respectively (they received formula, also). Mom had a plentiful supply with her 1st child, but not her 2nd. Mom wants to offer breast & formula to infant.  Mom knows that to maximize her potential milk supply, she should express her milk whenever infant gets formula. Mom knows we can provide her with a hand pump while here, if she'd like to use one. Mom has a Oncologist at home. On inspection, it is possible she will need size 21 flanges. Mom will try the flanges that came with her pump first, but will take note if too much areola is pulled in when pumping  Mom was made aware of O/P services, breastfeeding support groups, community resources, and our phone # for post-discharge questions.     Matthias Hughs [facility] 12/15/2020, 9:15 AM

## 2020-12-15 NOTE — Lactation Note (Signed)
This note was copied from a baby's chart. Lactation Consultation Note  Patient Name: Megan Webb [id number] Date: 12/15/2020 Reason for consult: L&D Initial assessment;Early term 37-38.6wks Age:26 hours   Initial L&D Consult:  Visited with family < 1 hour after birth 73 was awake and showing cues: STS on mother's chest.  Latched easily, however, he was not at all interested in initiating a suck.  Attempted to express colostrum and latch again with no success.  Reassured parents that lactation services will be available on the M/B unit.  Allowed time for family bonding.  Father and another visitor present.   Maternal Data    Feeding Mother's Current Feeding Choice: Breast Milk  LATCH Score Latch: Too sleepy or reluctant, no latch achieved, no sucking elicited.  Audible Swallowing: None  Type of Nipple: Everted at rest and after stimulation  Comfort (Breast/Nipple): Soft / non-tender  Hold (Positioning): Assistance needed to correctly position infant at breast and maintain latch.  LATCH Score: 5   Lactation Tools Discussed/Used    Interventions Interventions: Assisted with latch;Skin to skin  Discharge    Consult Status Consult Status: Follow-up from L&D    Aralynn Brake R Tanairy Payeur 12/15/2020, 7:19 AM

## 2020-12-15 NOTE — Anesthesia Procedure Notes (Signed)
Epidural Patient location during procedure: OB Start time: 12/15/2020 3:24 AM End time: 12/15/2020 3:34 AM  Staffing Anesthesiologist: Merlinda Frederick, MD Performed: anesthesiologist   Preanesthetic Checklist Completed: patient identified, IV checked, site marked, risks and benefits discussed, monitors and equipment checked, pre-op evaluation and timeout performed  Epidural Patient position: sitting Prep: DuraPrep Patient monitoring: heart rate, cardiac monitor, continuous pulse ox and blood pressure Approach: midline Location: L2-L3 Injection technique: LOR saline  Needle:  Needle type: Tuohy  Needle gauge: 17 G Needle length: 9 cm Needle insertion depth: 6 cm Catheter type: closed end flexible Catheter size: 20 Guage Catheter at skin depth: 11 cm Test dose: negative and Other  Assessment Events: blood not aspirated, injection not painful, no injection resistance and negative IV test  Additional Notes Informed consent obtained prior to proceeding including risk of failure, 1% risk of PDPH, risk of minor discomfort and bruising.  Discussed rare but serious complications including epidural abscess, permanent nerve injury, epidural hematoma.  Discussed alternatives to epidural analgesia and patient desires to proceed.  Timeout performed pre-procedure verifying patient name, procedure, and platelet count.  Patient tolerated procedure well.

## 2020-12-15 NOTE — Discharge Summary (Signed)
Postpartum Discharge Summary       Patient Name: Megan Webb DOB: 04-30-1995 MRN: [id number]  Date of admission: 12/15/2020 Delivery date:12/15/2020  Delivering provider: Orvis Brill  Date of discharge: 12/16/2020  Admitting diagnosis: Encounter for elective induction of labor [Z34.90] Intrauterine pregnancy: [redacted]w[redacted]d    Secondary diagnosis:  Active Problems:   Fetal macrosomia in third trimester   Pre-eclampsia, mild  Additional problems: none    Discharge diagnosis: Term Pregnancy Delivered and Preeclampsia (mild)                                              Post partum procedures: none Augmentation: AROM Complications: None  Hospital course: Onset of Labor With Vaginal Delivery      25y.o. yo [id number] admitted in Active Labor on 12/15/2020. Patient had a labor course remarkable for being dx with pre-e due to mild/mod range BP elevations on arrival and on L&D, as well as an elevated urine P/C ratio of 0.58 (other labs neg).  Membrane Rupture Time/Date: 5:29 AM ,12/15/2020   Delivery Method:Vaginal, Spontaneous  Episiotomy: None  Lacerations:  None  Patient had an uncomplicated postpartum course.  Her BPs postpartum have remained stable   She is ambulating, tolerating a regular diet, passing flatus, and urinating well. Patient is discharged home in stable condition on 12/16/20.  Newborn Data: Birth date:12/15/2020  Birth time:6:39 AM  Gender:Female  Living status:Living  Apgars:8 ,9  Weight:4180 g (9lb 3.4oz)  Magnesium Sulfate received: No BMZ received: No Rhophylac:N/A MMR:N/A T-DaP:Given prenatally Flu: Yes Transfusion:No  Physical exam  Vitals:   12/15/20 1510 12/15/20 1745 12/15/20 2240 12/16/20 0624  BP: (!) 138/93 (!) 140/95 129/80 122/80  Pulse: 87 80 73 86  Resp:  '18 18 16  ' Temp:  98.5 F (36.9 C) 98.7 F (37.1 C) 98 F (36.7 C)  TempSrc:  Oral Oral Oral  SpO2:  100% 99% 100%  Weight:      Height:       General:  alert, cooperative, and no distress Lochia: appropriate Uterine Fundus: firm Incision: N/A DVT Evaluation: No evidence of DVT seen on physical exam. Negative Homan's sign. Labs: Lab Results  Component Value Date   WBC 6.4 12/15/2020   HGB 10.5 (L) 12/15/2020   HCT 33.3 (L) 12/15/2020   MCV 80.8 12/15/2020   PLT 184 12/15/2020   CMP Latest Ref Rng & Units 12/15/2020  Glucose 70 - 99 mg/dL 105(H)  BUN 6 - 20 mg/dL 5(L)  Creatinine 0.44 - 1.00 mg/dL 0.52  Sodium 135 - 145 mmol/L 136  Potassium 3.5 - 5.1 mmol/L 4.2  Chloride 98 - 111 mmol/L 108  CO2 22 - 32 mmol/L 16(L)  Calcium 8.9 - 10.3 mg/dL 9.2  Total Protein 6.5 - 8.1 g/dL 5.8(L)  Total Bilirubin 0.3 - 1.2 mg/dL 0.5  Alkaline Phos 38 - 126 U/L 224(H)  AST 15 - 41 U/L 21  ALT 0 - 44 U/L 12   Edinburgh Score: Edinburgh Postnatal Depression Scale Screening Tool 12/15/2020  I have been able to laugh and see the funny side of things. 0  I have looked forward with enjoyment to things. 0  I have blamed myself unnecessarily when things went wrong. 0  I have been anxious or worried for no good reason. 0  I have felt scared or panicky  for no good reason. 0  Things have been getting on top of me. 0  I have been so unhappy that I have had difficulty sleeping. 0  I have felt sad or miserable. 0  I have been so unhappy that I have been crying. 0  The thought of harming myself has occurred to me. 0  Edinburgh Postnatal Depression Scale Total 0     After visit meds:  Allergies as of 12/16/2020   No Known Allergies      Medication List     STOP taking these medications    aspirin 81 MG chewable tablet       TAKE these medications    ferrous sulfate 325 (65 FE) MG tablet Take 325 mg by mouth daily with breakfast.   hydrOXYzine 25 MG tablet Commonly known as: ATARAX/VISTARIL Take 1 tablet (25 mg total) by mouth as needed.   ibuprofen 600 MG tablet Commonly known as: ADVIL Take 1 tablet (600 mg total) by mouth  every 6 (six) hours.   NIFEdipine 30 MG 24 hr tablet Commonly known as: ADALAT CC Take 1 tablet (30 mg total) by mouth daily.   PRENATAL VITAMINS PO Take by mouth.         Discharge home in stable condition Infant Feeding: Breast Infant Disposition:home with mother Discharge instruction: per After Visit Summary and Postpartum booklet. Activity: Advance as tolerated. Pelvic rest for 6 weeks.  Diet: routine diet Future Appointments: Future Appointments  Date Time Provider [place]  12/22/2020 10:30 AM [facility] NURSE [facility] None  01/14/2021  9:10 AM Shamleffer, Melanie Crazier, MD [facility] None  01/22/2021  1:50 PM Truett Mainland, DO [facility] None   Follow up Visit:  Myrtis Ser, CNM  P [facility] Admin Please schedule this patient for Postpartum visit in: 6 weeks with the following provider: Any provider  In-Person  For C/S patients schedule nurse incision check in weeks 2 weeks: no  High risk pregnancy complicated by: pre-e without severe features  Delivery mode:  SVD  Anticipated Birth Control:  POPs  PP Procedures needed: BP check in 1 week  Schedule Integrated [place] visit: no   12/16/2020 Truett Mainland, DO

## 2020-12-16 ENCOUNTER — Inpatient Hospital Stay (HOSPITAL_COMMUNITY): Payer: Medicaid Other

## 2020-12-16 ENCOUNTER — Inpatient Hospital Stay (HOSPITAL_COMMUNITY)
Admission: AD | Admit: 2020-12-16 | Payer: Medicaid Other | Source: Home / Self Care | Admitting: Obstetrics & Gynecology

## 2020-12-16 MED ORDER — IBUPROFEN 600 MG PO TABS: 600.0000 mg | ORAL_TABLET | Freq: Four times a day (QID) | ORAL | 0 refills | Status: DC

## 2020-12-16 MED ORDER — NIFEDIPINE ER 30 MG PO TB24: 30.0000 mg | ORAL_TABLET | Freq: Every day | ORAL | 0 refills | Status: DC

## 2020-12-16 NOTE — Lactation Note (Signed)
This note was copied from a baby's chart. Lactation Consultation Note  Patient Name: Megan Webb Date: 12/16/2020 Reason for consult: Follow-up assessment;Maternal endocrine disorder Age:26 hours  P3, Ex BF.  Mother denies questions or concerns. Baby has been cluster feeding.  Provided education. Reviewed engorgement care and monitoring voids/stools.   Feeding Mother's Current Feeding Choice: Breast Milk and Formula   Interventions Interventions: Education  Discharge Discharge Education: Engorgement and breast care;Warning signs for feeding baby  Consult Status Consult Status: Complete Date: 12/16/20    Vivianne Master [facility] 12/16/2020, 9:00 AM

## 2020-12-17 ENCOUNTER — Encounter: Payer: 59 | Admitting: Obstetrics & Gynecology

## 2020-12-22 ENCOUNTER — Ambulatory Visit: Payer: Medicaid Other

## 2020-12-24 ENCOUNTER — Encounter: Payer: 59 | Admitting: Family Medicine

## 2020-12-27 ENCOUNTER — Telehealth (HOSPITAL_COMMUNITY): Payer: Self-pay | Admitting: *Deleted

## 2020-12-27 NOTE — Telephone Encounter (Signed)
Patient voiced no questions or concerns at this time. EPDS=1. Patient voiced no questions or concerns regarding infant at this time. Patient reports infant sleeps in a crib on his back. RN reviewed ABCs of safe sleep. Patient verbalized understanding. Erline Levine, RN, 12/27/20, [phone]

## 2021-01-14 ENCOUNTER — Ambulatory Visit: Payer: 59 | Admitting: Internal Medicine

## 2021-01-14 NOTE — Progress Notes (Deleted)
Name: Megan Webb  MRN/ DOB: [id number], [birth year]    Age/ Sex: 25 y.o., female    PCP: Shelda Pal, DO   Reason for Endocrinology Evaluation: Thyroid nodule      Date of Initial Endocrinology Evaluation: 10/15/2020    HPI: [name] is a 25 y.o. female with a past medical history of Thyroid nodule . The patient presented for initial endocrinology clinic visit on 10/15/2020 for consultative assistance with her Thyroid nodule .    She is currently at 30.1 weeks of gestation  EDD 11/29th, 2022  She has noted occasional pain in the neck , she noted her neck enlarged through pictures  She has been gaining weight through pregnancy  Denies diarrhea or loose stools  Has chronic constipation  Has occasional palpitations  Has occasional nausea but no vomiting   No FH  of thyroid disease      HISTORICAL SUMMARY:   n review of her records she has been diagnosed with left thyroid nodule 2.2 cm in 2018 and an FNA was recommended. I am unable to find records of FNA of the thyroid nodule but per pt she already had an FNA  through [facility] .   In 2021 repeat ultrasound confirmed left thyroid nodule and FNA was again recommended. By 07/2020 the nodule showed stability.    She was also found to have low TSH during labs 04/2020  at 0.426 uIU/mL , repeat labs in 06/2020 showed normalization of TSH 1.580 with elevated T3 at 218 ng/dL and low FT4 at 0.77 ng/dL    She is S/P delivery on 12/15/2020   SUBJECTIVE:     Today (01/14/21):  [name] is here for a follow up on   HISTORY:  Past Medical History:  Past Medical History:  Diagnosis Date   Anemia    on Iron supplements   Anxiety    in the past; not current   Depression    in the past; not current   Headache    has migraines, takes Extra Strength Tylenol with minimal relief   Hyperthyroidism    nodule on thyroid; but not diagnosed with hyperthyroidism   Umbilical hernia 2035    Past Surgical History:  Past Surgical History:  Procedure Laterality Date   BUNIONECTOMY      Social History:  reports that she has never smoked. She has never used smokeless tobacco. She reports that she does not currently use drugs after having used the following drugs: Marijuana. Frequency: 1.00 time per week. She reports that she does not drink alcohol. Family History: family history includes Anemia in her mother; Arthritis in her maternal grandfather, maternal grandmother, and paternal grandfather; Asthma in her brother; Cancer in her maternal aunt; Cataracts in her father; Diabetes in her maternal aunt and maternal uncle; Hypertension in her maternal grandfather, maternal grandmother, and mother; Kidney disease in her mother; Mental illness in her father and mother; Stroke in her maternal grandfather, maternal grandmother, and paternal grandfather.   HOME MEDICATIONS: Allergies as of 01/14/2021   No Known Allergies      Medication List        Accurate as of January 14, 2021  7:30 AM. If you have any questions, ask your nurse or doctor.          ferrous sulfate 325 (65 FE) MG tablet Take 325 mg by mouth daily with breakfast.   hydrOXYzine 25 MG tablet Commonly known as: ATARAX Take 1 tablet (25  mg total) by mouth as needed.   ibuprofen 600 MG tablet Commonly known as: ADVIL Take 1 tablet (600 mg total) by mouth every 6 (six) hours.   NIFEdipine 30 MG 24 hr tablet Commonly known as: ADALAT CC Take 1 tablet (30 mg total) by mouth daily.   PRENATAL VITAMINS PO Take by mouth.          REVIEW OF SYSTEMS: A comprehensive ROS was conducted with the patient and is negative except as per HPI    OBJECTIVE:  VS: LMP 03/04/2020    Wt Readings from Last 3 Encounters:  12/15/20 188 lb (85.3 kg)  12/10/20 188 lb (85.3 kg)  12/03/20 185 lb (83.9 kg)     EXAM: General: Pt appears well and is in NAD  Neck: General: Supple without adenopathy. Thyroid: Thyroid  size normal.  Left thyroid nodule appreciated.  Lungs: Clear with good BS bilat with no rales, rhonchi, or wheezes  Heart: Auscultation: RRR.  Abdomen: Normoactive bowel sounds, soft, nontender, without masses or organomegaly palpable  Extremities:  BL LE: No pretibial edema normal ROM and strength.  Skin: Hair: Texture and amount normal with gender appropriate distribution Skin Inspection: No rashes Skin Palpation: Skin temperature, texture, and thickness normal to palpation  Neuro:  DTRs: 2+ and symmetric in UE without delay in relaxation phase  Mental Status: Judgment, insight: Intact Orientation: Oriented to time, place, and person Memory: Intact for recent and remote events Mood and affect: No depression, anxiety, or agitation     DATA REVIEWED:  [name], Megan Webb (MRN [id number]) as of 10/16/2020 13:37  Ref. Range 10/15/2020 09:25  TSH Latest Ref Range: 0.35 - 5.50 uIU/mL 1.33  Triiodothyronine (T3) Latest Ref Range: 76 - 181 ng/dL 217 (H)  Thyroxine (T4) Latest Ref Range: 5.1 - 11.9 mcg/dL 8.9      FNA 10/11/2016  Left thyroid nodule  The  smear shows large groups of atypical follicular cells  with nuclear fragments and overlap ,  and rare nuclear grooves in a back ground of scant colloid . The differential includes a true follicular neoplasm vs follicular variant of papillary neoplasm   ASSESSMENT/PLAN/RECOMMENDATIONS:   Left thyroid nodule :   - She is S/P [facility]NA in 2018 through [facility]. The result is inconclusive . These was no extra sample for Afirma at the time. Despite stable serial ultrasound , I have recommended repeating FNA with Afirma  - We have opted to proceed with this after delivery,since she is already in the 3rd trimester and would avoid invasive procedure at this time as the risk outweighs the benefit.        Signed electronically by: Mack Guise, MD  [facility]  [facility] [address]  [place], [place] [address] Phone: [phone] FAX: [phone]   CC: Shelda Pal, [place] [address] [place] [address] Phone: [phone] Fax: [phone]   Return to Endocrinology clinic as below: Future Appointments  Date Time Provider [place]  01/14/2021  9:10 AM Mehran Guderian, Melanie Crazier, MD [facility] None  01/22/2021  1:50 PM Truett Mainland, DO [facility] None

## 2021-01-22 ENCOUNTER — Telehealth (INDEPENDENT_AMBULATORY_CARE_PROVIDER_SITE_OTHER): Payer: Managed Care, Other (non HMO) | Admitting: Family Medicine

## 2021-01-22 MED ORDER — NORETHINDRONE 0.35 MG PO TABS: 1.0000 | ORAL_TABLET | Freq: Every day | ORAL | 3 refills | Status: DC

## 2021-01-22 NOTE — Progress Notes (Signed)
Virtual POSTPARTUM VISIT ENCOUNTER NOTE  Provider location: [facility] for [employer] at [facility]   I connected with [name] on 01/22/21 at  1:50 PM EST by telephone at home and verified that I am speaking with the correct person using two identifiers.   I discussed the limitations, risks, security and privacy concerns of performing an evaluation and management service by telephone and the availability of in person appointments. I also discussed with the patient that there may be a patient responsible charge related to this service. The patient expressed understanding and agreed to proceed.  Appointment Date: 01/22/2021  OBGYN Clinic: [facility]  Chief Complaint:  Postpartum Visit  History of Present Illness: Megan Webb is a 25 y.o. Hispanic [phone] (Patient's last menstrual period was 03/04/2020.), seen for the above chief complaint.   She is s/p normal spontaneous vaginal delivery on 12/15/20 at 38.6 weeks; she was discharged to home on 12/16/20 D#4. Pregnancy complicated by pre e. Baby is doing well yes.  Complains of NA  Vaginal bleeding or discharge:  light pink Mode of feeding infant:  breast Intercourse: No  Contraception: no method PP depression s/s: Yes .  Any bowel or bladder issues: no Pap smear: no abnormalities (date: 2021)  Review of Systems: Positive for negative. Her 12 point review of systems is negative or as noted in the History of Present Illness.  Patient Active Problem List   Diagnosis Date Noted   Pre-eclampsia, mild 12/15/2020   Fetal macrosomia in third trimester 11/26/2020   Supervision of other normal pregnancy, antepartum 05/20/2020   History of gestational hypertension 05/20/2020   Migraine without aura and without status migrainosus, not intractable 04/14/2019   Anemia 88/41/6606   Umbilical hernia 30/16/0109   H/O pre-eclampsia in prior pregnancy, currently pregnant 08/19/2017    Thyroid nodule 08/19/2017   Cannabis abuse 08/16/2011   History of suicide attempt 08/15/2011   Anxiety 08/15/2011   Benzodiazepine (tranquilizer) overdose 08/14/2011    Medications Gae Sharia Reeve had no medications administered during this visit. Current Outpatient Medications  Medication Sig Dispense Refill   norethindrone (MICRONOR) 0.35 MG tablet Take 1 tablet (0.35 mg total) by mouth daily. 90 tablet 3   ferrous sulfate 325 (65 FE) MG tablet Take 325 mg by mouth daily with breakfast.     hydrOXYzine (ATARAX/VISTARIL) 25 MG tablet Take 1 tablet (25 mg total) by mouth as needed. 30 tablet 2   ibuprofen (ADVIL) 600 MG tablet Take 1 tablet (600 mg total) by mouth every 6 (six) hours. 30 tablet 0   NIFEdipine (ADALAT CC) 30 MG 24 hr tablet Take 1 tablet (30 mg total) by mouth daily. 60 tablet 0   Prenatal Vit-Fe Fumarate-FA (PRENATAL VITAMINS PO) Take by mouth.     No current facility-administered medications for this visit.    Allergies Patient has no known allergies.  Physical Exam:  General:  Alert, oriented and cooperative.   Mental Status: Normal mood and affect perceived. Normal judgment and thought content.  Rest of physical exam deferred due to type of encounter  PP Depression Screening:    Edinburgh Postnatal Depression Scale - 01/22/21 1344       Edinburgh Postnatal Depression Scale:  In the Past 7 Days   I have been able to laugh and see the funny side of things. 0    I have looked forward with enjoyment to things. 0    I have blamed myself unnecessarily when things went wrong.  0    I have been anxious or worried for no good reason. 2    I have felt scared or panicky for no good reason. 0    Things have been getting on top of me. 2    I have felt sad or miserable. 0    I have been so unhappy that I have been crying. 0    The thought of harming myself has occurred to me. 0             Edinburgh Postnatal Depression Scale - 01/22/21 1344        Edinburgh Postnatal Depression Scale:  In the Past 7 Days   I have been able to laugh and see the funny side of things. 0    I have looked forward with enjoyment to things. 0    I have blamed myself unnecessarily when things went wrong. 0    I have been anxious or worried for no good reason. 2    I have felt scared or panicky for no good reason. 0    Things have been getting on top of me. 2    I have felt sad or miserable. 0    I have been so unhappy that I have been crying. 0    The thought of harming myself has occurred to me. 0              Assessment:Patient is a 25 y.o. [id number] who is 4 weeks postpartum from a normal spontaneous vaginal delivery.  She is doing well.   Plan: There are no diagnoses linked to this encounter.  RTC   I discussed the assessment and treatment plan with the patient. The patient was provided an opportunity to ask questions and all were answered. The patient agreed with the plan and demonstrated an understanding of the instructions.   The patient was advised to call back or seek an in-person evaluation/go to the ED for any concerning postpartum symptoms.  I provided 15 minutes of non-face-to-face time during this encounter.   [place] for [employer], [place]

## 2021-01-22 NOTE — Addendum Note (Signed)
Addended by: Truett Mainland on: 01/22/2021 02:19 PM   Modules accepted: Orders

## 2021-02-06 ENCOUNTER — Encounter: Payer: Self-pay | Admitting: Internal Medicine

## 2021-02-06 ENCOUNTER — Other Ambulatory Visit: Payer: Self-pay

## 2021-02-06 ENCOUNTER — Ambulatory Visit (INDEPENDENT_AMBULATORY_CARE_PROVIDER_SITE_OTHER): Payer: Managed Care, Other (non HMO) | Admitting: Internal Medicine

## 2021-02-06 VITALS — BP 128/70 | HR 88 | Ht 63.0 in | Wt 171.0 lb

## 2021-02-06 DIAGNOSIS — E041 Nontoxic single thyroid nodule: Secondary | ICD-10-CM | POA: Diagnosis not present

## 2021-02-06 LAB — TSH: TSH: 1.25 mIU/L

## 2021-02-06 LAB — T4, FREE: Free T4: 1.1 ng/dL (ref 0.8–1.8)

## 2021-02-06 LAB — T3, FREE: T3, Free: 3.4 pg/mL (ref 2.3–4.2)

## 2021-02-06 NOTE — Progress Notes (Signed)
Name: Megan Webb  MRN/ DOB: [id number], 1995-11-13    Age/ Sex: 26 y.o., female    PCP: Megan Pal, DO   Reason for Endocrinology Evaluation: Thyroid nodule      Date of Initial Endocrinology Evaluation: 10/15/2020    HPI: Megan Webb is a 26 y.o. female with a past medical history of Thyroid nodule . The patient presented for initial endocrinology clinic visit on 10/15/2020 for consultative assistance with her Thyroid nodule .       HISTORICAL SUMMARY:   n review of her records she has been diagnosed with left thyroid nodule 2.2 cm in 2018 and an FNA was recommended. I am unable to find records of FNA of the thyroid nodule but per pt she already had an FNA  through [facility] .  In 2021 repeat ultrasound confirmed left thyroid nodule and FNA was again recommended. By 07/2020 the nodule showed stability and we opted to monitor    She was also found to have low TSH during labs 04/2020  at 0.426 uIU/mL , repeat labs in 06/2020 showed normalization of TSH 1.580 with elevated T3 at 218 ng/dL and low FT4 at 0.77 ng/dL    She is S/P delivery on 12/15/2020- baby boy    No family history of thyroid disease  SUBJECTIVE:     Today (02/06/21):  Megan Webb is here for a follow up on thyroid nodule and abnormal thyroid function during pregnancy .   She is status post delivery of a baby boy in 11/2020  No recent local neck enlargement  She has constipation that she attributes to iron use She has been noted with palpitations over the past few months, this was initially attributed to pregnancy but she did have another episode on 02/03/2021.  She took nifedipine that morning and her BP by midday was 111/61 mmHg, and HR was 120 bpm  She does take nifedipine on as-needed basis if her BP is elevated She drinks 1 cup of coffee a day She had an echo in November 2022-unremarkable per patient    HISTORY:  Past Medical History:  Past Medical  History:  Diagnosis Date   Anemia    on Iron supplements   Anxiety    in the past; not current   Depression    in the past; not current   Headache    has migraines, takes Extra Strength Tylenol with minimal relief   Hyperthyroidism    nodule on thyroid; but not diagnosed with hyperthyroidism   Umbilical hernia 2409   Past Surgical History:  Past Surgical History:  Procedure Laterality Date   BUNIONECTOMY      Social History:  reports that she has never smoked. She has never used smokeless tobacco. She reports that she does not currently use drugs after having used the following drugs: Marijuana. Frequency: 1.00 time per week. She reports that she does not drink alcohol. Family History: family history includes Anemia in her mother; Arthritis in her maternal grandfather, maternal grandmother, and paternal grandfather; Asthma in her brother; Cancer in her maternal aunt; Cataracts in her father; Diabetes in her maternal aunt and maternal uncle; Hypertension in her maternal grandfather, maternal grandmother, and mother; Kidney disease in her mother; Mental illness in her father and mother; Stroke in her maternal grandfather, maternal grandmother, and paternal grandfather.   HOME MEDICATIONS: Allergies as of 02/06/2021   No Known Allergies      Medication List  Accurate as of February 06, 2021  4:51 PM. If you have any questions, ask your nurse or doctor.          ferrous sulfate 325 (65 FE) MG tablet Take 325 mg by mouth daily with breakfast.   NIFEdipine 30 MG 24 hr tablet Commonly known as: ADALAT CC Take 1 tablet (30 mg total) by mouth daily.   norethindrone 0.35 MG tablet Commonly known as: MICRONOR Take 1 tablet (0.35 mg total) by mouth daily.   PRENATAL VITAMINS PO Take by mouth.          REVIEW OF SYSTEMS: A comprehensive ROS was conducted with the patient and is negative except as per HPI    OBJECTIVE:  VS: BP 128/70 (BP Location: Left Arm,  Patient Position: Sitting, Cuff Size: Small)    Pulse 88    Ht 5\' 3"  (1.6 m)    Wt 171 lb (77.6 kg)    SpO2 99%    BMI 30.29 kg/m    Wt Readings from Last 3 Encounters:  02/06/21 171 lb (77.6 kg)  12/15/20 188 lb (85.3 kg)  12/10/20 188 lb (85.3 kg)     EXAM: General: Pt appears well and is in NAD  Neck: General: Supple without adenopathy. Thyroid: Thyroid size normal.  Left thyroid nodule appreciated.  Lungs: Clear with good BS bilat with no rales, rhonchi, or wheezes  Heart: Auscultation: RRR.  Abdomen: Normoactive bowel sounds, soft, nontender, without masses or organomegaly palpable  Extremities:  BL LE: No pretibial edema normal ROM and strength.  Mental Status: Judgment, insight: Intact Orientation: Oriented to time, place, and person Mood and affect: No depression, anxiety, or agitation     DATA REVIEWED:  [name] (MRN [id number]) as of 10/16/2020 13:37  Ref. Range 10/15/2020 09:25  TSH Latest Ref Range: 0.35 - 5.50 uIU/mL 1.33  Triiodothyronine (T3) Latest Ref Range: 76 - 181 ng/dL 217 (H)  Thyroxine (T4) Latest Ref Range: 5.1 - 11.9 mcg/dL 8.9      FNA 10/11/2016  Left thyroid nodule  The  smear shows large groups of atypical follicular cells  with nuclear fragments and overlap ,  and rare nuclear grooves in a back ground of scant colloid . The differential includes a true follicular neoplasm vs follicular variant of papillary neoplasm      Thyroid ultrasound 07/2020  Estimated total number of nodules >/= 1 cm: 1   Number of spongiform nodules >/=  2 cm not described below (TR1): 0   Number of mixed cystic and solid nodules >/= 1.5 cm not described below ([place]): 0   _________________________________________________________   Nodule # 1: 2.2 x 2 x 1.6 cm inferior left, previously 2.2 x 2.2 x 1.7; this was previously biopsied.   IMPRESSION: 1. Stable solitary left nodule, which has already been biopsied.      ASSESSMENT/PLAN/RECOMMENDATIONS:   Left thyroid nodule :   - She is S/P FNA in 2018 through [facility]. The result is inconclusive . These was no extra sample for Afirma at the time. -She had a repeat ultrasound in 06/2019 which showed a highly suspicious nodule despite stability in size but the patient was pregnant at the time in her third trimester and we opted to wait after delivery. -We will proceed with repeat FNA with Afirma of the left thyroid nodule, patient in agreement   Follow-up in 1 year  Signed electronically by: Mack Guise, MD  [facility]  [place]  [address] [place], [place] [address] Phone: [phone] FAX: [phone]   CC: Megan Webb, [place] [address] [place] [address] Phone: [phone] Fax: [phone]   Return to Endocrinology clinic as below: Future Appointments  Date Time Provider [place]  02/10/2022  3:40 PM Janara Klett, Melanie Crazier, MD [facility] None

## 2021-02-12 ENCOUNTER — Other Ambulatory Visit (HOSPITAL_COMMUNITY)
Admission: RE | Admit: 2021-02-12 | Discharge: 2021-02-12 | Disposition: A | Discharge status: Home / Self Care | Payer: Managed Care, Other (non HMO) | Source: Ambulatory Visit | Attending: Interventional Radiology | Admitting: Interventional Radiology

## 2021-02-12 ENCOUNTER — Ambulatory Visit
Admission: RE | Admit: 2021-02-12 | Discharge: 2021-02-12 | Disposition: A | Discharge status: Home / Self Care | Payer: Managed Care, Other (non HMO) | Source: Ambulatory Visit | Attending: Internal Medicine | Admitting: Internal Medicine

## 2021-02-12 DIAGNOSIS — C73 Malignant neoplasm of thyroid gland: Secondary | ICD-10-CM | POA: Diagnosis not present

## 2021-02-12 DIAGNOSIS — E041 Nontoxic single thyroid nodule: Secondary | ICD-10-CM

## 2021-02-13 LAB — CYTOLOGY - NON PAP

## 2021-02-16 ENCOUNTER — Telehealth: Payer: Self-pay | Admitting: Internal Medicine

## 2021-02-16 DIAGNOSIS — C73 Malignant neoplasm of thyroid gland: Secondary | ICD-10-CM

## 2021-02-16 DIAGNOSIS — E041 Nontoxic single thyroid nodule: Secondary | ICD-10-CM

## 2021-02-16 NOTE — Telephone Encounter (Signed)
Discussed cytology report with the pt on 02/16/2021 as below     An urgent referral has been placed to [place] surgery       Clinical History: Nodule # 1: 2.2 x 2 x 1.6 cm inferior left, previously  2.2 x 2.2 x 1.7; this was previously biopsied. Previous biopsy 10/08/16  Specimen Submitted:  A. 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 She was instructed to contact us in 1-2 weeks, if no response regarding referral

## 2021-02-16 NOTE — Telephone Encounter (Signed)
-----   Message from Greggory Keen, MD sent at 02/14/2021  9:17 AM EST ----- Just an FYI, didn't know when you would get this.  Thyroid bx positive for papillary ca  Thanks TS  ----- Message ----- From: Interface, Lab In [facility] Sent: 02/13/2021   5:25 PM EST To: Greggory Keen, MD

## 2021-02-26 ENCOUNTER — Other Ambulatory Visit: Payer: Self-pay

## 2021-02-26 ENCOUNTER — Encounter: Payer: Self-pay | Admitting: Family Medicine

## 2021-02-26 MED ORDER — FLUCONAZOLE 150 MG PO TABS: 150.0000 mg | ORAL_TABLET | Freq: Once | ORAL | 1 refills | Status: AC

## 2021-02-27 ENCOUNTER — Encounter: Payer: Self-pay | Admitting: Internal Medicine

## 2021-03-25 ENCOUNTER — Ambulatory Visit: Payer: Self-pay | Admitting: Surgery

## 2021-04-17 ENCOUNTER — Encounter (HOSPITAL_COMMUNITY): Payer: Self-pay

## 2021-04-17 ENCOUNTER — Other Ambulatory Visit: Payer: Self-pay

## 2021-04-17 ENCOUNTER — Emergency Department (HOSPITAL_COMMUNITY)
Admission: EM | Admit: 2021-04-17 | Discharge: 2021-04-17 | Disposition: A | Discharge status: Home / Self Care | Payer: Managed Care, Other (non HMO) | Attending: Emergency Medicine | Admitting: Emergency Medicine

## 2021-04-17 ENCOUNTER — Emergency Department (HOSPITAL_COMMUNITY): Payer: Managed Care, Other (non HMO)

## 2021-04-17 DIAGNOSIS — R1012 Left upper quadrant pain: Secondary | ICD-10-CM | POA: Diagnosis present

## 2021-04-17 DIAGNOSIS — R1032 Left lower quadrant pain: Secondary | ICD-10-CM | POA: Diagnosis not present

## 2021-04-17 DIAGNOSIS — R0602 Shortness of breath: Secondary | ICD-10-CM | POA: Insufficient documentation

## 2021-04-17 DIAGNOSIS — R109 Unspecified abdominal pain: Secondary | ICD-10-CM

## 2021-04-17 DIAGNOSIS — R079 Chest pain, unspecified: Secondary | ICD-10-CM

## 2021-04-17 DIAGNOSIS — R0789 Other chest pain: Secondary | ICD-10-CM | POA: Insufficient documentation

## 2021-04-17 LAB — URINALYSIS, ROUTINE W REFLEX MICROSCOPIC
Bilirubin Urine: NEGATIVE
Glucose, UA: NEGATIVE mg/dL
Ketones, ur: NEGATIVE mg/dL
Leukocytes,Ua: NEGATIVE
Nitrite: NEGATIVE
Protein, ur: NEGATIVE mg/dL
Specific Gravity, Urine: 1.006 (ref 1.005–1.030)
pH: 6 (ref 5.0–8.0)

## 2021-04-17 LAB — CBC WITH DIFFERENTIAL/PLATELET
Abs Immature Granulocytes: 0.01 10*3/uL (ref 0.00–0.07)
Basophils Absolute: 0 10*3/uL (ref 0.0–0.1)
Basophils Relative: 0 %
Eosinophils Absolute: 0.1 10*3/uL (ref 0.0–0.5)
Eosinophils Relative: 2 %
HCT: 41.6 % (ref 36.0–46.0)
Hemoglobin: 13.6 g/dL (ref 12.0–15.0)
Immature Granulocytes: 0 %
Lymphocytes Relative: 43 %
Lymphs Abs: 2 10*3/uL (ref 0.7–4.0)
MCH: 28.6 pg (ref 26.0–34.0)
MCHC: 32.7 g/dL (ref 30.0–36.0)
MCV: 87.6 fL (ref 80.0–100.0)
Monocytes Absolute: 0.3 10*3/uL (ref 0.1–1.0)
Monocytes Relative: 6 %
Neutro Abs: 2.3 10*3/uL (ref 1.7–7.7)
Neutrophils Relative %: 49 %
Platelets: 227 10*3/uL (ref 150–400)
RBC: 4.75 MIL/uL (ref 3.87–5.11)
RDW: 14.2 % (ref 11.5–15.5)
WBC: 4.7 10*3/uL (ref 4.0–10.5)
nRBC: 0 % (ref 0.0–0.2)

## 2021-04-17 LAB — COMPREHENSIVE METABOLIC PANEL
ALT: 45 U/L — ABNORMAL HIGH (ref 0–44)
AST: 43 U/L — ABNORMAL HIGH (ref 15–41)
Albumin: 4.8 g/dL (ref 3.5–5.0)
Alkaline Phosphatase: 80 U/L (ref 38–126)
Anion gap: 8 (ref 5–15)
BUN: 13 mg/dL (ref 6–20)
CO2: 22 mmol/L (ref 22–32)
Calcium: 9.6 mg/dL (ref 8.9–10.3)
Chloride: 109 mmol/L (ref 98–111)
Creatinine, Ser: 0.65 mg/dL (ref 0.44–1.00)
GFR, Estimated: >60 mL/min (ref >60)
Glucose, Bld: 95 mg/dL (ref 70–99)
Potassium: 4.4 mmol/L (ref 3.5–5.1)
Sodium: 139 mmol/L (ref 135–145)
Total Bilirubin: 0.9 mg/dL (ref 0.3–1.2)
Total Protein: 7.6 g/dL (ref 6.5–8.1)

## 2021-04-17 LAB — LIPASE, BLOOD: Lipase: 42 U/L (ref 11–51)

## 2021-04-17 LAB — I-STAT BETA HCG BLOOD, ED (MC, WL, AP ONLY): I-stat hCG, quantitative: <5 m[IU]/mL (ref <5)

## 2021-04-17 LAB — TROPONIN I (HIGH SENSITIVITY)
Troponin I (High Sensitivity): <2 ng/L (ref <18)
Troponin I (High Sensitivity): <2 ng/L (ref <18)

## 2021-04-17 LAB — LACTIC ACID, PLASMA
Lactic Acid, Venous: 1.6 mmol/L (ref 0.5–1.9)
Lactic Acid, Venous: 1.3 mmol/L (ref 0.5–1.9)

## 2021-04-17 MED ORDER — IOHEXOL 350 MG/ML SOLN
100.0000 mL | Freq: Once | INTRAVENOUS | Status: AC | PRN
Start: 2021-04-17 — End: 2021-04-17
  Administered 2021-04-17: 100 mL via INTRAVENOUS

## 2021-04-17 MED ORDER — CYCLOBENZAPRINE HCL 10 MG PO TABS: 10.0000 mg | ORAL_TABLET | Freq: Two times a day (BID) | ORAL | 0 refills | Status: DC | PRN

## 2021-04-17 MED ORDER — LIDOCAINE 5 % EX PTCH: 1.0000 | MEDICATED_PATCH | CUTANEOUS | 0 refills | Status: DC

## 2021-04-17 NOTE — Discharge Instructions (Signed)
Your work-up today was overall reassuring.  The CT imaging did not show any acute abnormalities to cause her symptoms.  With the tenderness, I do suspect you are having some torso wall discomfort likely skeletal in nature.  Please try the muscle relaxant and numbing patches to help with symptoms and please follow-up with your primary team.  If any symptoms change or worsen acutely, please return to the nearest emergency department. ?

## 2021-04-17 NOTE — ED Triage Notes (Signed)
Pt arrived c/o LUQ abdominal pain and states it hurts worse with a deep breath. Pt states the pain started last night. Pt endorses nausea but denies V/D. Pt states the pain is constant.  ?

## 2021-04-17 NOTE — ED Provider Notes (Signed)
?[facility] ?Provider Note ? ? ?CSN: [id number] ?Arrival date & time: 04/17/21  9937 ? ?  ? ?History ? ?Chief Complaint  ?Patient presents with  ? Abdominal Pain  ? ? ?Megan Webb is a 26 y.o. female. ? ?The history is provided by the patient and medical records. No language interpreter was used.  ?Abdominal Pain ?Pain location:  L flank, LUQ and LLQ ?Pain quality: aching   ?Pain radiates to:  Chest and L flank ?Pain severity:  Moderate ?Onset quality:  Sudden ?Duration:  1 day ?Timing:  Constant ?Progression:  Waxing and waning ?Chronicity:  New ?Relieved by:  Nothing ?Worsened by:  Deep breathing ?Ineffective treatments:  None tried ?Associated symptoms: chest pain and shortness of breath   ?Associated symptoms: no chills, no constipation, no cough, no diarrhea, no dysuria (frequency), no fatigue, no fever, no melena, no nausea, no vaginal bleeding, no vaginal discharge and no vomiting   ?Chest pain:  ?  Quality: aching   ?  Severity:  Moderate ?  Onset quality:  Sudden ?  Timing:  Constant ?  Progression:  Waxing and waning ?  Chronicity:  New ? ?  ? ?Home Medications ?Prior to Admission medications   ?Medication Sig Start Date End Date Taking? Authorizing Provider  ?ferrous sulfate 325 (65 FE) MG tablet Take 325 mg by mouth daily with breakfast.    [provider]  ?NIFEdipine (ADALAT CC) 30 MG 24 hr tablet Take 1 tablet (30 mg total) by mouth daily. 12/16/20   Truett Mainland, DO  ?norethindrone (MICRONOR) 0.35 MG tablet Take 1 tablet (0.35 mg total) by mouth daily. 01/22/21   Truett Mainland, DO  ?Prenatal Vit-Fe Fumarate-FA (PRENATAL VITAMINS PO) Take by mouth.    [provider]  ?citalopram (CELEXA) 20 MG tablet Take 1 tablet (20 mg total) by mouth daily. 11/23/18 04/01/19  Truett Mainland, DO  ?   ? ?Allergies    ?Patient has no known allergies.   ? ?Review of Systems   ?Review of Systems  ?Constitutional:  Negative for chills,  diaphoresis, fatigue and fever.  ?HENT:  Negative for congestion.   ?Eyes:  Negative for visual disturbance.  ?Respiratory:  Positive for shortness of breath. Negative for cough, chest tightness and wheezing.   ?Cardiovascular:  Positive for chest pain. Negative for palpitations and leg swelling.  ?Gastrointestinal:  Positive for abdominal pain. Negative for constipation, diarrhea, melena, nausea and vomiting.  ?Genitourinary:  Positive for flank pain and frequency. Negative for dysuria (frequency), vaginal bleeding and vaginal discharge.  ?Musculoskeletal:  Negative for back pain, neck pain and neck stiffness.  ?Skin:  Negative for rash and wound.  ?Neurological:  Negative for light-headedness and headaches.  ?Psychiatric/Behavioral:  Negative for agitation and confusion.   ?All other systems reviewed and are negative. ? ?Physical Exam ?Updated Vital Signs ?BP (!) 144/78 (BP Location: Right Arm)   Pulse 72   Temp 98.5 ?F (36.9 ?C) (Oral)   Resp 14   Ht '5\' 3"'$  (1.6 m)   Wt 79.8 kg   SpO2 100%   BMI 31.18 kg/m?  ?Physical Exam ?Vitals and nursing note reviewed.  ?Constitutional:   ?   General: She is not in acute distress. ?   Appearance: She is well-developed. She is not ill-appearing, toxic-appearing or diaphoretic.  ?HENT:  ?   Head: Normocephalic and atraumatic.  ?Eyes:  ?   Extraocular Movements: Extraocular movements intact.  ?   Conjunctiva/sclera: Conjunctivae  normal.  ?Cardiovascular:  ?   Rate and Rhythm: Normal rate and regular rhythm.  ?   Heart sounds: No murmur heard. ?Pulmonary:  ?   Effort: Pulmonary effort is normal. No respiratory distress.  ?   Breath sounds: Normal breath sounds. No wheezing, rhonchi or rales.  ?Chest:  ?   Chest wall: Tenderness present.  ? ? ?Abdominal:  ?   General: Abdomen is flat. Bowel sounds are normal. There is no distension.  ?   Palpations: Abdomen is soft.  ?   Tenderness: There is abdominal tenderness in the left upper quadrant and left lower quadrant.   ? ? ?Musculoskeletal:     ?   General: No swelling.  ?   Cervical back: Neck supple.  ?Skin: ?   General: Skin is warm and dry.  ?   Capillary Refill: Capillary refill takes less than 2 seconds.  ?Neurological:  ?   Mental Status: She is alert.  ?Psychiatric:     ?   Mood and Affect: Mood normal.  ? ? ?ED Results / Procedures / Treatments   ?Labs ?(all labs ordered are listed, but only abnormal results are displayed) ?Labs Reviewed  ?COMPREHENSIVE METABOLIC PANEL - Abnormal; Notable for the following components:  ?    Result Value  ? AST 43 (*)   ? ALT 45 (*)   ? All other components within normal limits  ?URINALYSIS, ROUTINE W REFLEX MICROSCOPIC - Abnormal; Notable for the following components:  ? Color, Urine STRAW (*)   ? Hgb urine dipstick SMALL (*)   ? Bacteria, UA RARE (*)   ? All other components within normal limits  ?URINE CULTURE  ?CBC WITH DIFFERENTIAL/PLATELET  ?LIPASE, BLOOD  ?LACTIC ACID, PLASMA  ?LACTIC ACID, PLASMA  ?I-STAT BETA HCG BLOOD, ED ([facility], [facility], [facility] ONLY)  ?TROPONIN I (HIGH SENSITIVITY)  ?TROPONIN I (HIGH SENSITIVITY)  ? ? ?EKG ?EKG Interpretation ? ?Date/Time:  Friday April 17 2021 09:42:08 EDT ?Ventricular Rate:  61 ?PR Interval:  144 ?QRS Duration: 101 ?QT Interval:  425 ?QTC Calculation: 429 ?R Axis:   50 ?Text Interpretation: Sinus rhythm when compared to prior, similar appearance. No STEMI Confirmed by Antony Blackbird [phone]) on 04/17/2021 10:25:21 AM ? ?Radiology ?CT Angio Chest PE W and/or Wo Contrast ? ?Result Date: 04/17/2021 ?CLINICAL DATA:  Pulmonary embolism (PE) suspected, high prob Recent diagnosis of papillary carcinoma, new pleuritic left-sided chest pain and left abdominal pain Rule out PE or diverticulitis. EXAM: CT ANGIOGRAPHY CHEST CT ABDOMEN AND PELVIS WITH CONTRAST TECHNIQUE: Multidetector CT imaging of the chest was performed using the standard protocol during bolus administration of intravenous contrast. Multiplanar CT image reconstructions and MIPs were obtained to  evaluate the vascular anatomy. Multidetector CT imaging of the abdomen and pelvis was performed using the standard protocol during bolus administration of intravenous contrast. RADIATION DOSE REDUCTION: This exam was performed according to the departmental dose-optimization program which includes automated exposure control, adjustment of the mA and/or kV according to patient size and/or use of iterative reconstruction technique. CONTRAST:  16m OMNIPAQUE IOHEXOL 350 MG/ML SOLN COMPARISON:  [place] 07[place]. IR ultrasound biopsy, 02/12/2021. Chest XR, 02/07/2020. FINDINGS: Suboptimal evaluation, secondary to motion degradation. CTA CHEST FINDINGS Cardiovascular: Satisfactory opacification of the pulmonary arteries to the segmental level. No evidence of segmental or larger pulmonary embolus. Normal heart size. No pericardial effusion. Mediastinum/Nodes: No enlarged mediastinal, hilar, or axillary lymph nodes. Anterior mediastinal soft tissue. LEFT thyroid gland enlargement, and 2.2 cm thyroid nodule with calcification.  Trachea, and esophagus demonstrate no significant findings. Lungs/Pleura: Lungs are clear. No suspicious pulmonary nodule or mass. No pleural effusion or pneumothorax. Musculoskeletal: No chest wall abnormality. Review of the MIP images confirms the above findings. CT ABDOMEN and PELVIS FINDINGS Hepatobiliary: No focal liver abnormality is seen. No gallstones, gallbladder wall thickening, or biliary dilatation. Pancreas: No pancreatic ductal dilatation or surrounding inflammatory changes. Spleen: Normal in size without focal abnormality. Adrenals/Urinary Tract: Adrenal glands are unremarkable. 4.5 cm RIGHT renal cyst. Kidneys are otherwise normal, without renal calculi or hydronephrosis. Bladder is unremarkable. Stomach/Bowel: Stomach is within normal limits. Appendix appears normal. No evidence of bowel wall thickening, distention, or inflammatory changes. Vascular/Lymphatic: No significant  vascular findings are present. No enlarged abdominal or pelvic lymph nodes. Reproductive: Uterus and bilateral adnexa are unremarkable. Other: No abdominal wall hernia or abnormality. No abdominopelvic ascites. Musculoskeletal: No acute

## 2021-04-17 NOTE — ED Notes (Signed)
Pt tolerated fluids, PO challenge passed.  ?

## 2021-04-18 LAB — URINE CULTURE: Culture: <10000 — AB

## 2021-04-24 NOTE — Patient Instructions (Addendum)
DUE TO COVID-19 ONLY ONE VISITOR  (aged 27 and older)  IS ALLOWED TO COME WITH YOU AND STAY IN THE WAITING ROOM ONLY DURING PRE OP AND PROCEDURE.   ?**NO VISITORS ARE ALLOWED IN THE SHORT STAY AREA OR RECOVERY ROOM!!** ? ?IF YOU WILL BE ADMITTED INTO THE HOSPITAL YOU ARE ALLOWED ONLY TWO SUPPORT PEOPLE DURING VISITATION HOURS ONLY (7 AM -8PM)   ?The support person(s) must pass our screening, gel in and out, and wear a mask at all times, including in the patient?s room. ?Patients must also wear a mask when staff or their support person are in the room. ?Visitors GUEST BADGE MUST BE WORN VISIBLY  ?One adult visitor may remain with you overnight and MUST be in the room by 8 P.M. ?  ? ? Your procedure is scheduled on: 05/08/21 ? ? Report to [facility] Main Entrance ? ?  Report to Short stay at : 5:15 AM ? ? Call this number if you have problems the morning of surgery [phone] ? ? Do not eat food :After Midnight. ? ? After Midnight you may have the following liquids until : 4:30 AM  DAY OF SURGERY ? ?Water ?Black Coffee (sugar ok, NO MILK/CREAM OR CREAMERS)  ?Tea (sugar ok, NO MILK/CREAM OR CREAMERS) regular and decaf                             ?Plain Jell-O (NO RED)                                           ?Fruit ices (not with fruit pulp, NO RED)                                     ?Popsicles (NO RED)                                                                  ?Juice: apple, WHITE grape, WHITE cranberry ?Sports drinks like Gatorade (NO RED) ?Clear broth(vegetable,chicken,beef) ?  ?NO SOLID FOOD AFTER MIDNIGHT THE NIGHT PRIOR TO SURGERY. NOTHING BY MOUTH EXCEPT CLEAR LIQUIDS UNTIL . PLEASE FINISH ENSURE DRINK PER SURGEON ORDER  WHICH NEEDS TO BE COMPLETED AT .  ? ?Oral Hygiene is also important to reduce your risk of infection.                                    ?Remember - BRUSH YOUR TEETH THE MORNING OF SURGERY WITH YOUR REGULAR TOOTHPASTE ? ? Do NOT smoke after Midnight ? ? Take these medicines  the morning of surgery with A SIP OF WATER: N/A ? ?DO NOT TAKE ANY ORAL DIABETIC MEDICATIONS DAY OF YOUR SURGERY ? ?Bring CPAP mask and tubing day of surgery. ?                  ?           You may not have any metal on  your body including hair pins, jewelry, and body piercing ? ?           Do not wear make-up, lotions, powders, perfumes/cologne, or deodorant ? ?Do not wear nail polish including gel and S&S, artificial/acrylic nails, or any other type of covering on natural nails including finger and toenails. If you have artificial nails, gel coating, etc. that needs to be removed by a nail salon please have this removed prior to surgery or surgery may need to be canceled/ delayed if the surgeon/ anesthesia feels like they are unable to be safely monitored.  ? ?Do not shave  48 hours prior to surgery. ? ? Do not bring valuables to the hospital. [facility] NOT ?            RESPONSIBLE   FOR VALUABLES. ? ? Contacts, dentures or bridgework may not be worn into surgery. ? ? Bring small overnight bag day of surgery. ?  ? Patients discharged on the day of surgery will not be allowed to drive home.  Someone NEEDS to stay with you for the first 24 hours after anesthesia. ? ? Special Instructions: Bring a copy of your healthcare power of attorney and living will documents         the day of surgery if you haven't scanned them before. ? ?            Please read over the following fact sheets you were given: IF [place] [phone] ? ?   [facility] - Preparing for Surgery ?Before surgery, you can play an important role.  Because skin is not sterile, your skin needs to be as free of germs as possible.  You can reduce the number of germs on your skin by washing with CHG (chlorahexidine gluconate) soap before surgery.  CHG is an antiseptic cleaner which kills germs and bonds with the skin to continue killing germs even after washing. ?Please DO NOT use if you have an  allergy to CHG or antibacterial soaps.  If your skin becomes reddened/irritated stop using the CHG and inform your nurse when you arrive at Short Stay. ?Do not shave (including legs and underarms) for at least 48 hours prior to the first CHG shower.  You may shave your face/neck. ?Please follow these instructions carefully: ? 1.  Shower with CHG Soap the night before surgery and the  morning of Surgery. ? 2.  If you choose to wash your hair, wash your hair first as usual with your  normal  shampoo. ? 3.  After you shampoo, rinse your hair and body thoroughly to remove the  shampoo.                           4.  Use CHG as you would any other liquid soap.  You can apply chg directly  to the skin and wash  ?                     Gently with a scrungie or clean washcloth. ? 5.  Apply the CHG Soap to your body ONLY FROM THE NECK DOWN.   Do not use on face/ open      ?                     Wound or open sores. Avoid contact with eyes, ears mouth and genitals (private parts).  ?  Wash face,  Genitals (private parts) with your normal soap. ?            6.  Wash thoroughly, paying special attention to the area where your surgery  will be performed. ? 7.  Thoroughly rinse your body with warm water from the neck down. ? 8.  DO NOT shower/wash with your normal soap after using and rinsing off  the CHG Soap. ?               9.  Pat yourself dry with a clean towel. ?           10.  Wear clean pajamas. ?           11.  Place clean sheets on your bed the night of your first shower and do not  sleep with pets. ?Day of Surgery : ?Do not apply any lotions/deodorants the morning of surgery.  Please wear clean clothes to the hospital/surgery center. ? ?FAILURE TO FOLLOW THESE INSTRUCTIONS MAY RESULT IN THE CANCELLATION OF YOUR SURGERY ?PATIENT SIGNATURE_________________________________ ? ?NURSE SIGNATURE__________________________________ ? ?________________________________________________________________________  ?

## 2021-04-27 ENCOUNTER — Encounter (HOSPITAL_COMMUNITY)
Admission: RE | Admit: 2021-04-27 | Discharge: 2021-04-27 | Disposition: A | Discharge status: Home / Self Care | Payer: Managed Care, Other (non HMO) | Source: Ambulatory Visit | Attending: Surgery | Admitting: Surgery

## 2021-04-27 ENCOUNTER — Ambulatory Visit (HOSPITAL_COMMUNITY)
Admission: RE | Admit: 2021-04-27 | Discharge: 2021-04-27 | Disposition: A | Discharge status: Home / Self Care | Payer: Managed Care, Other (non HMO) | Source: Ambulatory Visit | Attending: Anesthesiology | Admitting: Anesthesiology

## 2021-04-27 ENCOUNTER — Encounter (HOSPITAL_COMMUNITY): Payer: Self-pay

## 2021-04-27 ENCOUNTER — Other Ambulatory Visit: Payer: Self-pay

## 2021-04-27 VITALS — BP 120/92 | HR 109 | Temp 98.3°F | Ht 63.0 in | Wt 175.0 lb

## 2021-04-27 DIAGNOSIS — Z01818 Encounter for other preprocedural examination: Secondary | ICD-10-CM | POA: Insufficient documentation

## 2021-04-27 DIAGNOSIS — R7303 Prediabetes: Secondary | ICD-10-CM | POA: Insufficient documentation

## 2021-04-27 HISTORY — DX: Prediabetes: R73.03

## 2021-04-27 HISTORY — DX: Malignant (primary) neoplasm, unspecified: C80.1

## 2021-04-27 HISTORY — DX: Cyst of kidney, acquired: N28.1

## 2021-04-27 HISTORY — DX: Essential (primary) hypertension: I10

## 2021-04-27 LAB — CBC
HCT: 40.5 % (ref 36.0–46.0)
Hemoglobin: 13.3 g/dL (ref 12.0–15.0)
MCH: 28.7 pg (ref 26.0–34.0)
MCHC: 32.8 g/dL (ref 30.0–36.0)
MCV: 87.5 fL (ref 80.0–100.0)
Platelets: 230 10*3/uL (ref 150–400)
RBC: 4.63 MIL/uL (ref 3.87–5.11)
RDW: 13.9 % (ref 11.5–15.5)
WBC: 4.8 10*3/uL (ref 4.0–10.5)
nRBC: 0 % (ref 0.0–0.2)

## 2021-04-27 LAB — BASIC METABOLIC PANEL
Anion gap: 7 (ref 5–15)
BUN: 17 mg/dL (ref 6–20)
CO2: 22 mmol/L (ref 22–32)
Calcium: 9.3 mg/dL (ref 8.9–10.3)
Chloride: 107 mmol/L (ref 98–111)
Creatinine, Ser: 0.6 mg/dL (ref 0.44–1.00)
GFR, Estimated: >60 mL/min (ref >60)
Glucose, Bld: 92 mg/dL (ref 70–99)
Potassium: 3.8 mmol/L (ref 3.5–5.1)
Sodium: 136 mmol/L (ref 135–145)

## 2021-04-27 LAB — GLUCOSE, CAPILLARY: Glucose-Capillary: 90 mg/dL (ref 70–99)

## 2021-04-27 LAB — TYPE AND SCREEN
ABO/RH(D): A POS
Antibody Screen: NEGATIVE

## 2021-04-27 NOTE — Progress Notes (Signed)
For Short Stay: ?[facility] appointment date: N/A ?Date of COVID positive in last 90 days: N/A ?COVID Vaccine: NO ?Bowel Prep reminder: ? ? ?For Anesthesia: ?PCP - DO: Nolene Ebbs ?Cardiologist -  ? ?Chest x-ray -  ?EKG - 04/20/21 ?Stress Test -  ?ECHO -  ?Cardiac Cath -  ?Pacemaker/ICD device last checked: ?Pacemaker orders received: ?Device Rep notified: ? ?Spinal Cord Stimulator: ? ?Sleep Study -  ?CPAP -  ? ?Fasting Blood Sugar -  ?Checks Blood Sugar _____ times a day ?Date and result of last Hgb A1c- ? ?Blood Thinner Instructions: ?Aspirin Instructions: ?Last Dose: ? ?Activity level: Can go up a flight of stairs and activities of daily living without stopping and without chest pain and/or shortness of breath ?  Able to exercise without chest pain and/or shortness of breath ?  Unable to go up a flight of stairs without chest pain and/or shortness of breath ?   ? ?Anesthesia review:  ? ?Patient denies shortness of breath, fever, cough and chest pain at PAT appointment ? ? ?Patient verbalized understanding of instructions that were given to them at the PAT appointment. Patient was also instructed that they will need to review over the PAT instructions again at home before surgery.  ?

## 2021-04-28 LAB — HEMOGLOBIN A1C
Hgb A1c MFr Bld: 5.6 % (ref 4.8–5.6)
Mean Plasma Glucose: 114 mg/dL

## 2021-05-01 ENCOUNTER — Encounter (HOSPITAL_COMMUNITY): Payer: Self-pay | Admitting: Surgery

## 2021-05-01 DIAGNOSIS — C73 Malignant neoplasm of thyroid gland: Secondary | ICD-10-CM | POA: Diagnosis present

## 2021-05-01 NOTE — H&P (Signed)
? ? ? ? ?REFERRING PHYSICIAN: Shamleffer, Ibtehal Jar* ? ?PROVIDER: Nason Conradt Charlotta Newton, MD ? ?Chief Complaint: New Consultation (Papillary thyroid carcinoma - Dr. Kelton Pillar) ? ? ?History of Present Illness: ? ?Patient is referred by Dr. Vivia Ewing for surgical evaluation and management of newly diagnosed papillary thyroid carcinoma. Patient was originally diagnosed with a left-sided thyroid nodule in 2018. She had been evaluated at [facility]. She had undergone fine-needle aspiration biopsy with some atypia. A decision was made to monitor. Patient had sequential ultrasound studies in which the nodule remained essentially unchanged. Patient was seen by Dr. Kelton Pillar in the fall 2022. Previous ultrasound in July 2022 had shown a stable 2.2 cm nodule in the left thyroid lobe. Since previous fine-needle aspiration biopsy results were somewhat inconclusive, a repeat biopsy was performed on February 12, 2021. This demonstrated findings consistent with papillary thyroid carcinoma, Bethesda category VI. Patient has no prior history of head or neck surgery. She has never been on thyroid medication. There is no family history of thyroid cancer or other endocrine neoplasm. Patient is the mother of 3 children age 40 and under. ? ?Review of Systems: ?A complete review of systems was obtained from the patient. I have reviewed this information and discussed as appropriate with the patient. See HPI as well for other ROS. ? ?Review of Systems  ?Constitutional: Negative.  ?HENT: Negative.  ?Eyes: Negative.  ?Respiratory: Negative.  ?Cardiovascular: Negative.  ?Gastrointestinal: Negative.  ?Genitourinary: Negative.  ?Musculoskeletal: Negative.  ?Skin: Negative.  ?Neurological: Negative.  ?Endo/Heme/Allergies: Negative.  ?Psychiatric/Behavioral: Negative.  ? ? ?Medical History: ?Past Medical History:  ?Diagnosis Date  ? Anemia  ? Anxiety  ? GERD (gastroesophageal reflux disease)  ? History of cancer  ? ?Patient Active Problem  List  ?Diagnosis  ? Papillary thyroid carcinoma (CMS-HCC)  ? Thyroid nodule  ? ?History reviewed. No pertinent surgical history.  ? ?No Known Allergies ? ?Current Outpatient Medications on File Prior to Visit  ?Medication Sig Dispense Refill  ? hydrOXYzine (ATARAX) 25 MG tablet Take 25 mg by mouth once daily as needed  ? norethindrone (MICRONOR) 0.35 mg tablet  ? omeprazole (PRILOSEC) 10 MG DR capsule Take 10 mg by mouth once daily  ? prenatal 74/BSWH fum/folic/dha (PRENATAL-1 ORAL) Take by mouth  ? ?No current facility-administered medications on file prior to visit.  ? ?Family History  ?Problem Relation Age of Onset  ? High blood pressure (Hypertension) Mother  ? Hyperlipidemia (Elevated cholesterol) Father  ? High blood pressure (Hypertension) Father  ? ? ?Social History  ? ?Tobacco Use  ?Smoking Status Never  ?Smokeless Tobacco Never  ? ? ?Social History  ? ?Socioeconomic History  ? Marital status: Married  ?Tobacco Use  ? Smoking status: Never  ? Smokeless tobacco: Never  ?Substance and Sexual Activity  ? Alcohol use: Yes  ? Drug use: Yes  ?Types: Marijuana  ? ?Objective:  ? ?Vitals:  ?BP: 120/86  ?Pulse: 89  ?Temp: 36.8 ?C (98.2 ?F)  ?SpO2: 98%  ?Weight: 78.8 kg (173 lb 12.8 oz)  ?Height: 160 cm ('5\' 3"'$ )  ? ?Body mass index is 30.79 kg/m?. ? ?Physical Exam  ? ?GENERAL APPEARANCE ?Development: normal ?Nutritional status: normal ?Gross deformities: none ? ?SKIN ?Rash, lesions, ulcers: none ?Induration, erythema: none ?Nodules: none palpable ? ?EYES ?Conjunctiva and lids: normal ?Pupils: equal and reactive ?Iris: normal bilaterally ? ?EARS, NOSE, MOUTH, THROAT ?External ears: no lesion or deformity ?External nose: no lesion or deformity ?Hearing: grossly normal ?Due to Covid-19 pandemic, patient is wearing  a mask. ? ?NECK ?Symmetric: yes ?Trachea: midline ?Thyroid: The right thyroid lobe is without palpable abnormality. Palpation of the left thyroid lobe shows a firm discrete 3 cm nodule located centrally. It is  mobile with swallowing. It is nontender. There is no associated palpable lymphadenopathy. ? ?CHEST ?Respiratory effort: normal ?Retraction or accessory muscle use: no ?Breath sounds: normal bilaterally ?Rales, rhonchi, wheeze: none ? ?CARDIOVASCULAR ?Auscultation: regular rhythm, normal rate ?Murmurs: none ?Pulses: radial pulse 2+ palpable ?Lower extremity edema: none ? ?MUSCULOSKELETAL ?Station and gait: normal ?Digits and nails: no clubbing or cyanosis ?Muscle strength: grossly normal all extremities ?Range of motion: grossly normal all extremities ?Deformity: none ? ?LYMPHATIC ?Cervical: none palpable ?Supraclavicular: none palpable ? ?PSYCHIATRIC ?Oriented to person, place, and time: yes ?Mood and affect: normal for situation ?Judgment and insight: appropriate for situation ? ? ?Assessment and Plan:  ? ?Papillary thyroid carcinoma (CMS-HCC) ? ?Thyroid nodule ? ? ?Patient is referred by Dr. Vivia Ewing for surgical evaluation and management of newly diagnosed papillary thyroid carcinoma. ? ?Patient provided with a copy of "The Thyroid Book: Medical and Surgical Treatment of Thyroid Problems", published by [employer], 16 pages. Book reviewed and explained to patient during visit today. ? ?Patient has a 2.2 cm papillary thyroid carcinoma in the left thyroid lobe. I have recommended proceeding with total thyroidectomy with limited central compartment lymph node dissection for definitive management and diagnosis. We discussed the procedure. We discussed the size and location of the surgical incision. We discussed potential complications including recurrent laryngeal nerve injury and injury to parathyroid glands. We discussed the hospital stay to be anticipated. We discussed her postoperative recovery and return to work and activities. We discussed the need for lifelong thyroid hormone replacement. We discussed the potential need for radioactive iodine treatment. The patient understands and wishes to proceed with  surgery in the near future. ? ?The risks and benefits of the procedure have been discussed at length with the patient. The patient understands the proposed procedure, potential alternative treatments, and the course of recovery to be expected. All of the patient's questions have been answered at this time. The patient wishes to proceed with surgery.  ? ?Armandina Gemma, MD ?[facility] ?A [facility] practice ?Office: [phone] ? ?

## 2021-05-08 ENCOUNTER — Encounter (HOSPITAL_COMMUNITY)
Admission: RE | Disposition: A | Discharge status: Home / Self Care | Payer: Self-pay | Source: Ambulatory Visit | Attending: Surgery

## 2021-05-08 ENCOUNTER — Encounter (HOSPITAL_COMMUNITY): Payer: Self-pay | Admitting: Surgery

## 2021-05-08 ENCOUNTER — Other Ambulatory Visit: Payer: Self-pay

## 2021-05-08 ENCOUNTER — Ambulatory Visit (HOSPITAL_COMMUNITY): Payer: Medicaid Other | Admitting: Anesthesiology

## 2021-05-08 ENCOUNTER — Ambulatory Visit (HOSPITAL_COMMUNITY)
Admission: RE | Admit: 2021-05-08 | Discharge: 2021-05-09 | Disposition: A | Discharge status: Home / Self Care | Payer: Medicaid Other | Source: Ambulatory Visit | Attending: Surgery | Admitting: Surgery

## 2021-05-08 ENCOUNTER — Ambulatory Visit (HOSPITAL_BASED_OUTPATIENT_CLINIC_OR_DEPARTMENT_OTHER): Payer: Medicaid Other | Admitting: Anesthesiology

## 2021-05-08 ENCOUNTER — Telehealth: Payer: Self-pay | Admitting: Internal Medicine

## 2021-05-08 DIAGNOSIS — E0590 Thyrotoxicosis, unspecified without thyrotoxic crisis or storm: Secondary | ICD-10-CM | POA: Insufficient documentation

## 2021-05-08 DIAGNOSIS — F419 Anxiety disorder, unspecified: Secondary | ICD-10-CM | POA: Insufficient documentation

## 2021-05-08 DIAGNOSIS — E041 Nontoxic single thyroid nodule: Secondary | ICD-10-CM | POA: Diagnosis present

## 2021-05-08 DIAGNOSIS — I10 Essential (primary) hypertension: Secondary | ICD-10-CM | POA: Diagnosis not present

## 2021-05-08 DIAGNOSIS — C73 Malignant neoplasm of thyroid gland: Secondary | ICD-10-CM

## 2021-05-08 DIAGNOSIS — Z79899 Other long term (current) drug therapy: Secondary | ICD-10-CM | POA: Insufficient documentation

## 2021-05-08 DIAGNOSIS — E065 Other chronic thyroiditis: Secondary | ICD-10-CM | POA: Diagnosis not present

## 2021-05-08 DIAGNOSIS — Z01818 Encounter for other preprocedural examination: Secondary | ICD-10-CM

## 2021-05-08 HISTORY — PX: THYROIDECTOMY: SHX17

## 2021-05-08 HISTORY — PX: LYMPH NODE DISSECTION: SHX5087

## 2021-05-08 LAB — GLUCOSE, CAPILLARY: Glucose-Capillary: 127 mg/dL — ABNORMAL HIGH (ref 70–99)

## 2021-05-08 LAB — PREGNANCY, URINE: Preg Test, Ur: NEGATIVE

## 2021-05-08 SURGERY — THYROIDECTOMY
Anesthesia: General | Site: Neck

## 2021-05-08 MED ORDER — ONDANSETRON HCL 4 MG/2ML IJ SOLN
INTRAMUSCULAR | Status: AC
  Filled 2021-05-08: qty 2

## 2021-05-08 MED ORDER — FENTANYL CITRATE (PF) 100 MCG/2ML IJ SOLN
INTRAMUSCULAR | Status: AC
Start: 2021-05-08 — End: ?
  Filled 2021-05-08: qty 2

## 2021-05-08 MED ORDER — ONDANSETRON 4 MG PO TBDP
4.0000 mg | ORAL_TABLET | Freq: Four times a day (QID) | ORAL | Status: DC | PRN
Start: 2021-05-08 — End: 2021-05-09

## 2021-05-08 MED ORDER — LIDOCAINE HCL (PF) 2 % IJ SOLN
INTRAMUSCULAR | Status: DC | PRN
  Administered 2021-05-08: 1.5 mg/kg/h via INTRADERMAL

## 2021-05-08 MED ORDER — ONDANSETRON HCL 4 MG/2ML IJ SOLN: 4.0000 mg | Freq: Once | INTRAMUSCULAR | Status: DC | PRN

## 2021-05-08 MED ORDER — OXYCODONE HCL 5 MG/5ML PO SOLN: 5.0000 mg | Freq: Once | ORAL | Status: DC | PRN

## 2021-05-08 MED ORDER — LIDOCAINE HCL (CARDIAC) PF 100 MG/5ML IV SOSY
PREFILLED_SYRINGE | INTRAVENOUS | Status: DC | PRN
  Administered 2021-05-08: 100 mg via INTRAVENOUS

## 2021-05-08 MED ORDER — TRAMADOL HCL 50 MG PO TABS: 50.0000 mg | ORAL_TABLET | Freq: Four times a day (QID) | ORAL | 0 refills | Status: DC | PRN

## 2021-05-08 MED ORDER — ACETAMINOPHEN 650 MG RE SUPP: 650.0000 mg | Freq: Four times a day (QID) | RECTAL | Status: DC | PRN

## 2021-05-08 MED ORDER — ACETAMINOPHEN 325 MG PO TABS: 650.0000 mg | ORAL_TABLET | Freq: Four times a day (QID) | ORAL | Status: DC | PRN

## 2021-05-08 MED ORDER — LEVOTHYROXINE SODIUM 100 MCG PO TABS: 100.0000 ug | ORAL_TABLET | Freq: Every day | ORAL | 3 refills | Status: DC

## 2021-05-08 MED ORDER — LABETALOL HCL 5 MG/ML IV SOLN
INTRAVENOUS | Status: DC | PRN
  Administered 2021-05-08: 5 mg via INTRAVENOUS

## 2021-05-08 MED ORDER — PROPOFOL 10 MG/ML IV BOLUS
INTRAVENOUS | Status: DC | PRN
  Administered 2021-05-08: 20 mg via INTRAVENOUS
  Administered 2021-05-08: 30 mg via INTRAVENOUS
  Administered 2021-05-08: 150 mg via INTRAVENOUS

## 2021-05-08 MED ORDER — DEXAMETHASONE SODIUM PHOSPHATE 10 MG/ML IJ SOLN
INTRAMUSCULAR | Status: DC | PRN
  Administered 2021-05-08: 10 mg via INTRAVENOUS

## 2021-05-08 MED ORDER — ORAL CARE MOUTH RINSE: 15.0000 mL | Freq: Once | OROMUCOSAL | Status: AC

## 2021-05-08 MED ORDER — OXYCODONE HCL 5 MG PO TABS: 5.0000 mg | ORAL_TABLET | ORAL | Status: DC | PRN

## 2021-05-08 MED ORDER — ROCURONIUM BROMIDE 10 MG/ML (PF) SYRINGE
PREFILLED_SYRINGE | INTRAVENOUS | Status: AC
  Filled 2021-05-08: qty 10

## 2021-05-08 MED ORDER — CALCIUM CARBONATE 1250 (500 CA) MG PO TABS
2.0000 | ORAL_TABLET | Freq: Three times a day (TID) | ORAL | Status: DC
  Administered 2021-05-08 – 2021-05-09 (×2): 1000 mg via ORAL
  Filled 2021-05-08 (×2): qty 1

## 2021-05-08 MED ORDER — NORETHINDRONE 0.35 MG PO TABS
1.0000 | ORAL_TABLET | Freq: Every day | ORAL | Status: DC
  Administered 2021-05-09: 0.35 mg via ORAL

## 2021-05-08 MED ORDER — ONDANSETRON HCL 4 MG/2ML IJ SOLN
INTRAMUSCULAR | Status: DC | PRN
  Administered 2021-05-08: 4 mg via INTRAVENOUS

## 2021-05-08 MED ORDER — LIDOCAINE HCL 2 % IJ SOLN
INTRAMUSCULAR | Status: AC
  Filled 2021-05-08: qty 20

## 2021-05-08 MED ORDER — AMISULPRIDE (ANTIEMETIC) 5 MG/2ML IV SOLN: 10.0000 mg | Freq: Once | INTRAVENOUS | Status: DC | PRN

## 2021-05-08 MED ORDER — CALCIUM CARBONATE ANTACID 500 MG PO CHEW: 2.0000 | CHEWABLE_TABLET | Freq: Three times a day (TID) | ORAL | 1 refills | Status: DC

## 2021-05-08 MED ORDER — DEXAMETHASONE SODIUM PHOSPHATE 10 MG/ML IJ SOLN
INTRAMUSCULAR | Status: AC
  Filled 2021-05-08: qty 1

## 2021-05-08 MED ORDER — 0.9 % SODIUM CHLORIDE (POUR BTL) OPTIME
TOPICAL | Status: DC | PRN
  Administered 2021-05-08: 1000 mL

## 2021-05-08 MED ORDER — TRAMADOL HCL 50 MG PO TABS
50.0000 mg | ORAL_TABLET | Freq: Four times a day (QID) | ORAL | Status: DC | PRN
  Administered 2021-05-08: 50 mg via ORAL
  Filled 2021-05-08: qty 1

## 2021-05-08 MED ORDER — ROCURONIUM BROMIDE 10 MG/ML (PF) SYRINGE
PREFILLED_SYRINGE | INTRAVENOUS | Status: DC | PRN
  Administered 2021-05-08 (×2): 20 mg via INTRAVENOUS
  Administered 2021-05-08: 50 mg via INTRAVENOUS

## 2021-05-08 MED ORDER — SODIUM CHLORIDE 0.45 % IV SOLN: INTRAVENOUS | Status: DC

## 2021-05-08 MED ORDER — PROPOFOL 10 MG/ML IV BOLUS
INTRAVENOUS | Status: AC
  Filled 2021-05-08: qty 20

## 2021-05-08 MED ORDER — FENTANYL CITRATE (PF) 100 MCG/2ML IJ SOLN
INTRAMUSCULAR | Status: DC | PRN
  Administered 2021-05-08: 100 ug via INTRAVENOUS
  Administered 2021-05-08 (×4): 50 ug via INTRAVENOUS

## 2021-05-08 MED ORDER — ACETAMINOPHEN 500 MG PO TABS
1000.0000 mg | ORAL_TABLET | Freq: Once | ORAL | Status: AC
Start: 2021-05-08 — End: 2021-05-08
  Administered 2021-05-08: 1000 mg via ORAL
  Filled 2021-05-08: qty 2

## 2021-05-08 MED ORDER — CHLORHEXIDINE GLUCONATE CLOTH 2 % EX PADS
6.0000 | MEDICATED_PAD | Freq: Once | CUTANEOUS | Status: DC
Start: 2021-05-08 — End: 2021-05-08

## 2021-05-08 MED ORDER — HEMOSTATIC AGENTS (NO CHARGE) OPTIME
TOPICAL | Status: DC | PRN
  Administered 2021-05-08: 1 via TOPICAL

## 2021-05-08 MED ORDER — SCOPOLAMINE 1 MG/3DAYS TD PT72
1.0000 | MEDICATED_PATCH | Freq: Once | TRANSDERMAL | Status: DC
  Administered 2021-05-08: 1.5 mg via TRANSDERMAL
  Filled 2021-05-08: qty 1

## 2021-05-08 MED ORDER — HYDROMORPHONE HCL 1 MG/ML IJ SOLN
1.0000 mg | INTRAMUSCULAR | Status: DC | PRN
  Administered 2021-05-08: 1 mg via INTRAVENOUS
  Filled 2021-05-08: qty 1

## 2021-05-08 MED ORDER — LIDOCAINE HCL (PF) 2 % IJ SOLN
INTRAMUSCULAR | Status: AC
  Filled 2021-05-08: qty 5

## 2021-05-08 MED ORDER — IBUPROFEN 400 MG PO TABS: 600.0000 mg | ORAL_TABLET | Freq: Four times a day (QID) | ORAL | Status: DC | PRN

## 2021-05-08 MED ORDER — CHLORHEXIDINE GLUCONATE 0.12 % MT SOLN
15.0000 mL | Freq: Once | OROMUCOSAL | Status: AC
  Administered 2021-05-08: 15 mL via OROMUCOSAL

## 2021-05-08 MED ORDER — FENTANYL CITRATE PF 50 MCG/ML IJ SOSY
PREFILLED_SYRINGE | INTRAMUSCULAR | Status: AC
  Filled 2021-05-08: qty 2

## 2021-05-08 MED ORDER — ONDANSETRON HCL 4 MG/2ML IJ SOLN
4.0000 mg | Freq: Four times a day (QID) | INTRAMUSCULAR | Status: DC | PRN
  Administered 2021-05-08: 4 mg via INTRAVENOUS
  Filled 2021-05-08: qty 2

## 2021-05-08 MED ORDER — FENTANYL CITRATE (PF) 100 MCG/2ML IJ SOLN
INTRAMUSCULAR | Status: AC
  Filled 2021-05-08: qty 2

## 2021-05-08 MED ORDER — CHLORHEXIDINE GLUCONATE CLOTH 2 % EX PADS: 6.0000 | MEDICATED_PAD | Freq: Once | CUTANEOUS | Status: DC

## 2021-05-08 MED ORDER — SUGAMMADEX SODIUM 200 MG/2ML IV SOLN
INTRAVENOUS | Status: DC | PRN
  Administered 2021-05-08: 200 mg via INTRAVENOUS

## 2021-05-08 MED ORDER — MIDAZOLAM HCL 2 MG/2ML IJ SOLN
INTRAMUSCULAR | Status: AC
  Filled 2021-05-08: qty 2

## 2021-05-08 MED ORDER — FENTANYL CITRATE PF 50 MCG/ML IJ SOSY
25.0000 ug | PREFILLED_SYRINGE | INTRAMUSCULAR | Status: DC | PRN
  Administered 2021-05-08 (×2): 50 ug via INTRAVENOUS

## 2021-05-08 MED ORDER — OXYCODONE HCL 5 MG PO TABS: 5.0000 mg | ORAL_TABLET | Freq: Once | ORAL | Status: DC | PRN

## 2021-05-08 MED ORDER — LACTATED RINGERS IV SOLN
INTRAVENOUS | Status: DC
Start: 2021-05-08 — End: 2021-05-08

## 2021-05-08 MED ORDER — MIDAZOLAM HCL 5 MG/5ML IJ SOLN
INTRAMUSCULAR | Status: DC | PRN
  Administered 2021-05-08: 2 mg via INTRAVENOUS

## 2021-05-08 MED ORDER — CEFAZOLIN SODIUM-DEXTROSE 2-4 GM/100ML-% IV SOLN
2.0000 g | INTRAVENOUS | Status: AC
  Administered 2021-05-08: 2 g via INTRAVENOUS
  Filled 2021-05-08: qty 100

## 2021-05-08 MED ORDER — DEXMEDETOMIDINE (PRECEDEX) IN NS 20 MCG/5ML (4 MCG/ML) IV SYRINGE
PREFILLED_SYRINGE | INTRAVENOUS | Status: DC | PRN
  Administered 2021-05-08 (×2): 8 ug via INTRAVENOUS
  Administered 2021-05-08: 4 ug via INTRAVENOUS

## 2021-05-08 SURGICAL SUPPLY — 60 items
ADH SKN CLS APL DERMABOND .7 (GAUZE/BANDAGES/DRESSINGS) ×1
APL PRP STRL LF DISP 70% ISPRP (MISCELLANEOUS) ×2
ATTRACTOMAT 16X20 MAGNETIC DRP (DRAPES) ×2 IMPLANT
BAG COUNTER SPONGE SURGICOUNT (BAG) ×1 IMPLANT
BAG SPNG CNTER NS LX DISP (BAG)
BLADE SURG 15 STRL LF DISP TIS (BLADE) ×1 IMPLANT
BLADE SURG 15 STRL SS (BLADE) ×2
BLADE SURG SZ10 CARB STEEL (BLADE) IMPLANT
CANISTER SUCT 3000ML PPV (MISCELLANEOUS) IMPLANT
CHLORAPREP W/TINT 26 (MISCELLANEOUS) ×4 IMPLANT
CLIP TI MEDIUM 6 (CLIP) ×5 IMPLANT
CLIP TI WIDE RED SMALL 6 (CLIP) ×6 IMPLANT
COVER BACK TABLE 60X90IN (DRAPES) ×2 IMPLANT
COVER MAYO STAND STRL (DRAPES) ×2 IMPLANT
COVER SURGICAL LIGHT HANDLE (MISCELLANEOUS) ×2 IMPLANT
DERMABOND ADVANCED (GAUZE/BANDAGES/DRESSINGS) ×1
DERMABOND ADVANCED .7 DNX12 (GAUZE/BANDAGES/DRESSINGS) ×1 IMPLANT
DRAPE LAPAROTOMY T 98X78 PEDS (DRAPES) ×2 IMPLANT
DRAPE UTILITY XL STRL (DRAPES) ×2 IMPLANT
ELECT COATED BLADE 2.86 ST (ELECTRODE) ×2 IMPLANT
ELECT PENCIL ROCKER SW 15FT (MISCELLANEOUS) ×2 IMPLANT
ELECT REM PT RETURN 15FT ADLT (MISCELLANEOUS) ×2 IMPLANT
GAUZE 4X4 16PLY ~~LOC~~+RFID DBL (SPONGE) ×2 IMPLANT
GLOVE BIOGEL PI IND STRL 8 (GLOVE) ×1 IMPLANT
GLOVE BIOGEL PI INDICATOR 8 (GLOVE) ×1
GLOVE SURG ORTHO 8.0 STRL STRW (GLOVE) ×2 IMPLANT
GLOVE SURG SYN 7.5  E (GLOVE) ×6
GLOVE SURG SYN 7.5 E (GLOVE) ×3 IMPLANT
GLOVE SURG SYN 7.5 PF PI (GLOVE) ×3 IMPLANT
GOWN STRL REUS W/ TWL XL LVL3 (GOWN DISPOSABLE) ×2 IMPLANT
GOWN STRL REUS W/TWL 2XL LVL3 (GOWN DISPOSABLE) ×2 IMPLANT
GOWN STRL REUS W/TWL XL LVL3 (GOWN DISPOSABLE) ×4
HEMOSTAT SURGICEL 2X4 FIBR (HEMOSTASIS) ×2 IMPLANT
ILLUMINATOR WAVEGUIDE N/F (MISCELLANEOUS) ×2 IMPLANT
KIT BASIN OR (CUSTOM PROCEDURE TRAY) ×2 IMPLANT
KIT TURNOVER KIT A (KITS) ×1 IMPLANT
NDL HYPO 25X1 1.5 SAFETY (NEEDLE) ×1 IMPLANT
NEEDLE HYPO 25X1 1.5 SAFETY (NEEDLE) ×2 IMPLANT
NS IRRIG 1000ML POUR BTL (IV SOLUTION) ×2 IMPLANT
PACK BASIC VI WITH GOWN DISP (CUSTOM PROCEDURE TRAY) ×2 IMPLANT
PENCIL SMOKE EVACUATOR (MISCELLANEOUS) IMPLANT
SHEARS HARMONIC 9CM CVD (BLADE) ×2 IMPLANT
SLEEVE SCD COMPRESS KNEE MED (STOCKING) IMPLANT
STAPLER VISISTAT 35W (STAPLE) IMPLANT
SUT MNCRL AB 4-0 PS2 18 (SUTURE) ×2 IMPLANT
SUT MON AB 3-0 SH 27 (SUTURE)
SUT MON AB 3-0 SH27 (SUTURE) IMPLANT
SUT MON AB 5-0 PS2 18 (SUTURE) ×2 IMPLANT
SUT SILK 3 0 SH 30 (SUTURE) IMPLANT
SUT VIC AB 3-0 SH 18 (SUTURE) ×4 IMPLANT
SUT VIC AB 3-0 SH 27 (SUTURE) ×2
SUT VIC AB 3-0 SH 27X BRD (SUTURE) ×1 IMPLANT
SUT VIC AB 4-0 BRD 54 (SUTURE) IMPLANT
SYR BULB EAR ULCER 3OZ GRN STR (SYRINGE) IMPLANT
SYR BULB IRRIG 60ML STRL (SYRINGE) ×2 IMPLANT
SYR CONTROL 10ML LL (SYRINGE) ×2 IMPLANT
TOWEL OR 17X26 10 PK STRL BLUE (TOWEL DISPOSABLE) ×4 IMPLANT
TOWEL OR NON WOVEN STRL DISP B (DISPOSABLE) ×2 IMPLANT
TUBING CONNECTING 10 (TUBING) ×2 IMPLANT
YANKAUER SUCT BULB TIP NO VENT (SUCTIONS) IMPLANT

## 2021-05-08 NOTE — Anesthesia Procedure Notes (Signed)
Procedure Name: Intubation ?Date/Time: 05/08/2021 7:22 AM ?Performed by: Lind Covert, CRNA ?Pre-anesthesia Checklist: Patient identified, Emergency Drugs available, Suction available, Patient being monitored and Timeout performed ?Patient Re-evaluated:Patient Re-evaluated prior to induction ?Oxygen Delivery Method: Circle system utilized ?Preoxygenation: Pre-oxygenation with 100% oxygen ?Induction Type: IV induction ?Ventilation: Mask ventilation without difficulty ?Laryngoscope Size: Mac and 3 ?Grade View: Grade I ?Tube type: Oral ?Tube size: 7.0 mm ?Number of attempts: 1 ?Airway Equipment and Method: Stylet ?Placement Confirmation: ETT inserted through vocal cords under direct vision, positive ETCO2 and breath sounds checked- equal and bilateral ?Secured at: 21 cm ?Tube secured with: Tape ?Dental Injury: Teeth and Oropharynx as per pre-operative assessment  ? ? ? ? ?

## 2021-05-08 NOTE — Progress Notes (Signed)
?  Transition of Care (TOC) Screening Note ? ? ?Patient Details  ?Name: Megan Webb ?Date of Birth: 1995/07/12 ? ? ?Transition of Care (TOC) CM/SW Contact:    ?Donzella Carrol, LCSW ?Phone Number: ?05/08/2021, 10:48 AM ? ? ? ?Transition of Care Department [facility]) has reviewed patient and no TOC needs have been identified at this time. We will continue to monitor patient advancement through interdisciplinary progression rounds. If new patient transition needs arise, please place a TOC consult. ? ? ?

## 2021-05-08 NOTE — Op Note (Signed)
Procedure Note ? ?Pre-operative Diagnosis:  papillary thyroid carcinoma ? ?Post-operative Diagnosis:  same ? ?Surgeon:  Armandina Gemma, MD ? ?Assistant:  Johnathan Hausen, MD  ? ?Procedure:  Total thyroidectomy with limited central compartment lymph node dissection (Zone VI) ? ?Anesthesia:  General ? ?Estimated Blood Loss:  minimal ? ?Drains: none ?        ?Specimen: thyroid to pathology ? ?Indications:  Patient is referred by Dr. Vivia Ewing for surgical evaluation and management of newly diagnosed papillary thyroid carcinoma. Patient was originally diagnosed with a left-sided thyroid nodule in 2018. She had been evaluated at [facility]. She had undergone fine-needle aspiration biopsy with some atypia. A decision was made to monitor. Patient had sequential ultrasound studies in which the nodule remained essentially unchanged. Patient was seen by Dr. Kelton Pillar in the fall 2022. Previous ultrasound in July 2022 had shown a stable 2.2 cm nodule in the left thyroid lobe. Since previous fine-needle aspiration biopsy results were somewhat inconclusive, a repeat biopsy was performed on February 12, 2021. This demonstrated findings consistent with papillary thyroid carcinoma, Bethesda category VI.  Patient now comes to surgery for thyroidectomy. ? ?Procedure Details: Procedure was done in OR #1 at the [facility]. The patient was brought to the operating room and placed in a supine position on the operating room table. Following administration of general anesthesia, the patient was positioned and then prepped and draped in the usual aseptic fashion. After ascertaining that an adequate level of anesthesia had been achieved, a small Kocher incision was made with #15 blade. Dissection was carried through subcutaneous tissues and platysma.Hemostasis was achieved with the electrocautery. Skin flaps were elevated cephalad and caudad from the thyroid notch to the sternal notch. A [place] self-retaining retractor was  placed for exposure. Strap muscles were incised in the midline and dissection was begun on the left side.  Strap muscles were reflected laterally.  Left thyroid lobe was mildly enlarged with a dominant nodule in the inferior pole which was quite firm.  The left lobe was gently mobilized with blunt dissection. Superior pole vessels were dissected out and divided individually between small and medium ligaclips with the harmonic scalpel. The thyroid lobe was rolled anteriorly. Branches of the inferior thyroid artery were divided between small ligaclips with the harmonic scalpel. Inferior venous tributaries were divided between ligaclips. Both the superior and inferior parathyroid glands were identified and preserved on their vascular pedicles. The recurrent laryngeal nerve was identified and preserved along its course. The ligament of Gwenlyn Found was released with the electrocautery and the gland was mobilized onto the anterior trachea. Isthmus was mobilized across the midline. There was no significant pyramidal lobe present. Dry pack was placed in the left neck. ? ?The right thyroid lobe was gently mobilized with blunt dissection. Right thyroid lobe was mildly enlarged. Superior pole vessels were dissected out and divided between small and medium ligaclips with the Harmonic scalpel. Superior parathyroid was identified and preserved. Inferior venous tributaries were divided between medium ligaclips with the harmonic scalpel. The right thyroid lobe was rolled anteriorly and the branches of the inferior thyroid artery divided between small ligaclips. The right recurrent laryngeal nerve was identified and preserved along its course. The ligament of Gwenlyn Found was released with the electrocautery. The right thyroid lobe was mobilized onto the anterior trachea and the remainder of the thyroid was dissected off the anterior trachea and the thyroid was completely excised. A suture was used to mark the left lobe. The entire thyroid gland  was submitted to pathology for review. ? ?The neck was palpated for any evidence of lymphadenopathy.  There was no significant palpable adenopathy along either jugular chains.  There was no palpable adenopathy within the central compartment.  Central compartment dissection was performed using the electrocautery for hemostasis.  Care was taken to avoid the parathyroid glands and the recurrent nerve.  There appeared to be significantly more lymph nodes present to the right of midline.  Tissue was excised and submitted separately labeled as central compartment lymph nodes, Zone VI. ? ?The neck was irrigated with warm saline. Fibrillar was placed throughout the operative field. Strap muscles were approximated in the midline with interrupted 3-0 Vicryl sutures. Platysma was closed with interrupted 3-0 Vicryl sutures. Skin was closed with a running 4-0 Monocryl subcuticular suture. Wound was washed and Dermabond was applied. The patient was awakened from anesthesia and brought to the recovery room. The patient tolerated the procedure well. ? ? ?Armandina Gemma, MD ?[facility] ?Office: [phone]  ?

## 2021-05-08 NOTE — Anesthesia Postprocedure Evaluation (Signed)
Anesthesia Post Note ? ?Patient: Adithi Gammon ? ?Procedure(s) Performed: THYROIDECTOMY (Neck) ?LIMITED LYMPH NODE DISSECTION (Neck) ? ?  ? ?Patient location during evaluation: PACU ?Anesthesia Type: General ?Level of consciousness: awake ?Pain management: pain level controlled ?Vital Signs Assessment: post-procedure vital signs reviewed and stable ?Respiratory status: spontaneous breathing and respiratory function stable ?Cardiovascular status: stable ?Postop Assessment: no apparent nausea or vomiting ?Anesthetic complications: no ? ? ?No notable events documented. ? ?Last Vitals:  ?Vitals:  ? 05/08/21 1030 05/08/21 1049  ?BP: (!) 142/99 (!) 147/101  ?Pulse: 83 80  ?Resp: 16 16  ?Temp:  37.3 ?C  ?SpO2: 97% 100%  ?  ?Last Pain:  ?Vitals:  ? 05/08/21 1049  ?TempSrc: Oral  ?PainSc: 8   ? ? ?  ?  ?  ?  ?  ?  ? ?Merlinda Frederick ? ? ? ? ?

## 2021-05-08 NOTE — Transfer of Care (Signed)
Immediate Anesthesia Transfer of Care Note ? ?Patient: Megan Webb ? ?Procedure(s) Performed: THYROIDECTOMY (Neck) ?LIMITED LYMPH NODE DISSECTION (Neck) ? ?Patient Location: PACU ? ?Anesthesia Type:General ? ?Level of Consciousness: sedated ? ?Airway & Oxygen Therapy: Patient Spontanous Breathing and Patient connected to face mask oxygen ? ?Post-op Assessment: Report given to RN and Post -op Vital signs reviewed and stable ? ?Post vital signs: Reviewed and stable ? ?Last Vitals:  ?Vitals Value Taken Time  ?BP 145/91 05/08/21 0943  ?Temp    ?Pulse 95 05/08/21 0944  ?Resp 17 05/08/21 0944  ?SpO2 100 % 05/08/21 0944  ?Vitals shown include unvalidated device data. ? ?Last Pain:  ?Vitals:  ? 05/08/21 0608  ?TempSrc:   ?PainSc: 0-No pain  ?   ? ?Patients Stated Pain Goal: 3 (05/08/21 4818) ? ?Complications: No notable events documented. ?

## 2021-05-08 NOTE — Anesthesia Preprocedure Evaluation (Signed)
Anesthesia Evaluation  ?Patient identified by MRN, date of birth, ID band ?Patient awake ? ? ? ?Reviewed: ?Allergy & Precautions, NPO status , Patient's Chart, lab work & pertinent test results ? ?Airway ?Mallampati: I ? ?TM Distance: >3 FB ?Neck ROM: Full ? ? ? Dental ?no notable dental hx. ? ?  ?Pulmonary ?neg pulmonary ROS,  ?  ?Pulmonary exam normal ?breath sounds clear to auscultation ? ? ? ? ? ? Cardiovascular ?hypertension, Pt. on medications ?negative cardio ROS ?Normal cardiovascular exam ?Rhythm:Regular Rate:Normal ? ? ?  ?Neuro/Psych ? Headaches, PSYCHIATRIC DISORDERS Anxiety Depression   ? GI/Hepatic ?negative GI ROS, Neg liver ROS,   ?Endo/Other  ?Hyperthyroidism  ? Renal/GU ?negative Renal ROS  ?negative genitourinary ?  ?Musculoskeletal ?negative musculoskeletal ROS ?(+)  ? Abdominal ?  ?Peds ?negative pediatric ROS ?(+)  Hematology ? ?(+) Blood dyscrasia, anemia ,   ?Anesthesia Other Findings ? ? Reproductive/Obstetrics ?negative OB ROS ? ?  ? ? ? ? ? ? ? ? ? ? ? ? ? ?  ?  ? ? ? ? ? ? ? ? ?Anesthesia Physical ?Anesthesia Plan ? ?ASA: 2 ? ?Anesthesia Plan: General  ? ?Post-op Pain Management:   ? ?Induction: Intravenous ? ?PONV Risk Score and Plan: 3 and Treatment may vary due to age or medical condition, Ondansetron, Scopolamine patch - Pre-op, Dexamethasone and Midazolam ? ?Airway Management Planned: Oral ETT ? ?Additional Equipment:  ? ?Intra-op Plan:  ? ?Post-operative Plan: Extubation in OR ? ?Informed Consent: I have reviewed the patients History and Physical, chart, labs and discussed the procedure including the risks, benefits and alternatives for the proposed anesthesia with the patient or authorized representative who has indicated his/her understanding and acceptance.  ? ? ? ?Dental advisory given ? ?Plan Discussed with: CRNA, Anesthesiologist and Surgeon ? ?Anesthesia Plan Comments:   ? ? ? ? ? ? ?Anesthesia Quick Evaluation ? ?

## 2021-05-08 NOTE — Telephone Encounter (Signed)
Patient recently had thyroid cancer surgery ? ? ?I will need to see her by  mid  June please ? ? ? ?Thanks ?

## 2021-05-08 NOTE — Discharge Instructions (Signed)
[facility] - Dr. Radley Barto  THYROID & PARATHYROID SURGERY:  POST-OP INSTRUCTIONS  Always review the instruction sheet provided by the hospital nurse at discharge.  A prescription for pain medication may be sent to your pharmacy at the time of discharge.  Take your pain medication as prescribed.  If narcotic pain medicine is not needed, then you may take acetaminophen (Tylenol) or ibuprofen (Advil) as needed for pain or soreness.  Take your normal home medications as prescribed unless otherwise directed.  If you need a refill on your pain medication, please contact the office during regular business hours.  Prescriptions will not be processed by the office after 5:00PM or on weekends.  Start with a light diet upon arrival home, such as soup and crackers or toast.  Be sure to drink plenty of fluids.  Resume your normal diet the day after surgery.  Most patients will experience some swelling and bruising on the chest and neck area.  Ice packs will help for the first 48 hours after arriving home.  Swelling and bruising will take several days to resolve.   It is common to experience some constipation after surgery.  Increasing fluid intake and taking a stool softener (Colace) will usually help to prevent this problem.  A mild laxative (Milk of Magnesia or Miralax) should be taken according to package directions if there has been no bowel movement after 48 hours.  Dermabond glue covers your incision. This seals the wound and you may shower at any time. The Dermabond will remain in place for about a week.  You may gradually remove the glue when it loosens around the edges.  If you need to loosen the Dermabond for removal, apply a layer of Vaseline to the wound for 15 minutes and then remove with a Kleenex. Your sutures are under the skin and will not show - they will dissolve on their own.  You may resume light daily activities beginning the day after discharge (such as self-care,  walking, climbing stairs), gradually increasing activities as tolerated. You may have sexual intercourse when it is comfortable. Refrain from any heavy lifting or straining until approved by your doctor. You may drive when you no longer are taking prescription pain medication, you can comfortably wear a seatbelt, and you can safely maneuver your car and apply the brakes.  You will see your doctor in the office for a follow-up appointment approximately three weeks after your surgery.  Make sure that you call for this appointment within a day or two after you arrive home to insure a convenient appointment time. Please have any requested laboratory tests performed a few days prior to your office visit so that the results will be available at your follow up appointment.  WHEN TO CALL THE [facility] OFFICE: -- Fever greater than 101.5 -- Inability to urinate -- Nausea and/or vomiting - persistent -- Extreme swelling or bruising -- Continued bleeding from incision -- Increased pain, redness, or drainage from the incision -- Difficulty swallowing or breathing -- Muscle cramping or spasms -- Numbness or tingling in hands or around lips  The clinic staff is available to answer your questions during regular business hours.  Please don't hesitate to call and ask to speak to one of the nurses if you have concerns.  [facility] OFFICE: [phone] (24 hours)  Please sign up for MyChart accounts. This will allow you to communicate directly with my nurse or myself without having to call the office. It will also allow you   to view your test results. You will need to enroll in MyChart for my office ([facility]) and for the hospital ([facility]).  Khrystal Jeanmarie, MD [facility] A [facility] practice 

## 2021-05-08 NOTE — Interval H&P Note (Signed)
History and Physical Interval Note: ? ?05/08/2021 ?6:59 AM ? ?Megan Webb  has presented today for surgery, with the diagnosis of PAPILLARY THYROID CARCINOMA.  The various methods of treatment have been discussed with the patient and family. After consideration of risks, benefits and other options for treatment, the patient has consented to  ? ? Procedure(s): ?THYROIDECTOMY (N/A) ?LIMITED LYMPH NODE DISSECTION (N/A) as a surgical intervention.   ? ?The patient's history has been reviewed, patient examined, no change in status, stable for surgery.  I have reviewed the patient's chart and labs.  Questions were answered to the patient's satisfaction.   ? ?Armandina Gemma, MD ?[facility] ?A [facility] practice ?Office: [phone] ? ? ?Armandina Gemma ? ? ?

## 2021-05-09 ENCOUNTER — Encounter (HOSPITAL_COMMUNITY): Payer: Self-pay | Admitting: Surgery

## 2021-05-09 DIAGNOSIS — C73 Malignant neoplasm of thyroid gland: Secondary | ICD-10-CM | POA: Diagnosis not present

## 2021-05-09 LAB — BASIC METABOLIC PANEL
Anion gap: 12 (ref 5–15)
BUN: 12 mg/dL (ref 6–20)
CO2: 22 mmol/L (ref 22–32)
Calcium: 9.1 mg/dL (ref 8.9–10.3)
Chloride: 107 mmol/L (ref 98–111)
Creatinine, Ser: 0.76 mg/dL (ref 0.44–1.00)
GFR, Estimated: >60 mL/min (ref >60)
Glucose, Bld: 136 mg/dL — ABNORMAL HIGH (ref 70–99)
Potassium: 3.4 mmol/L — ABNORMAL LOW (ref 3.5–5.1)
Sodium: 141 mmol/L (ref 135–145)

## 2021-05-09 NOTE — Discharge Summary (Signed)
Patient ID: ?[name] ?MRN: [id number] ?DOB/AGE: 29-May-1995 26 y.o. ? ?Admit date: 05/08/2021 ?Discharge date: 05/09/2021 ? ?Discharge Diagnoses ?Patient Active Problem List  ? Diagnosis Date Noted  ? Papillary thyroid carcinoma ([place]) 05/01/2021  ? Pre-eclampsia, mild 12/15/2020  ? Migraine without aura and without status migrainosus, not intractable 04/14/2019  ? Anemia 03/12/2018  ? Umbilical hernia 85/92/9244  ? H/O pre-eclampsia in prior pregnancy, currently pregnant 08/19/2017  ? Thyroid nodule 08/19/2017  ? Cannabis abuse 08/16/2011  ? History of suicide attempt 08/15/2011  ? Anxiety 08/15/2011  ? Benzodiazepine (tranquilizer) overdose 08/14/2011  ? ? ?Consultants ?None ? ?Procedures ?OR 05/08/21 -  ?Total thyroidectomy with limited central compartment lymph node dissection (Zone VI) ? ?Hospital Course: Admitted postoperatively and recovered uneventfully. Calcium stable and normal. On POD#1 she is comfortable with and stable for discharge home. Expectations reviewed with her, questions answered. Follow-up arranged in our office. ? ? ?Allergies as of 05/09/2021   ?No Known Allergies ?  ? ?  ?Medication List  ?  ? ?STOP taking these medications   ? ?cyclobenzaprine 10 MG tablet ?Commonly known as: FLEXERIL ?  ?lidocaine 5 % ?Commonly known as: Lidoderm ?  ?NIFEdipine 30 MG 24 hr tablet ?Commonly known as: ADALAT CC ?  ? ?  ? ?TAKE these medications   ? ?calcium carbonate 500 MG chewable tablet ?Commonly known as: Tums ?Chew 2 tablets (400 mg of elemental calcium total) by mouth 3 (three) times daily. ?  ?ferrous sulfate 325 (65 FE) MG tablet ?Take 325 mg by mouth daily with breakfast. ?  ?hydrOXYzine 25 MG tablet ?Commonly known as: ATARAX ?Take 25 mg by mouth daily as needed. ?  ?levothyroxine 100 MCG tablet ?Commonly known as: Synthroid ?Take 1 tablet (100 mcg total) by mouth daily before breakfast. ?  ?norethindrone 0.35 MG tablet ?Commonly known as: MICRONOR ?Take 1 tablet (0.35 mg total) by mouth  daily. ?  ?PRENATAL VITAMINS PO ?Take by mouth. ?  ?traMADol 50 MG tablet ?Commonly known as: ULTRAM ?Take 1-2 tablets (50-100 mg total) by mouth every 6 (six) hours as needed. ?  ? ?  ? ? ? ? Follow-up Information   ? ? Armandina Gemma, MD. Schedule an appointment as soon as possible for a visit in 3 week(s).   ?Specialty: General Surgery ?Why: For wound re-check ?Contact information: ?[address] ?[address] ?[place] [address] ?[phone] ? ? ?  ?  ? ?  ?  ? ?  ? ? ?Sharon Mt. Dema Severin, M.D. ?[facility] ? ?

## 2021-05-09 NOTE — Progress Notes (Signed)
?  Subjective ?No acute events. Feeling well. Soreness as expected. Eating and drinking without trouble. Normal voice ? ?Objective: ?Vital signs in last 24 hours: ?Temp:  [97.9 ?F (36.6 ?C)-99.1 ?F (37.3 ?C)] 98.9 ?F (37.2 ?C) (04/15 0160) ?Pulse Rate:  [60-95] 71 (04/15 0927) ?Resp:  [14-20] 18 (04/15 0927) ?BP: (116-161)/(69-101) 116/69 (04/15 1093) ?SpO2:  [97 %-100 %] 99 % (04/15 0927) ?Last BM Date : 05/08/21 ? ?Intake/Output from previous day: ?04/14 0701 - 04/15 0700 ?In: 2030.1 [P.O.:240; I.V.:1790.1] ?Out: 1375 [ATFTD:3220] ?Intake/Output this shift: ?No intake/output data recorded. ? ?Gen: NAD, comfortable, just finished breakfast ?CV: RRR ?Pulm: Normal work of breathing ?Neck: Wound with dermabond in place. No drainage. Expected mild swelling. No hematoma or significant bruising. ? ?Lab Results: ?CBC  ?No results for input(s): WBC, HGB, HCT, PLT in the last 72 hours. ?BMET ?Recent Labs  ?  05/09/21 ?0502  ?NA 141  ?K 3.4*  ?CL 107  ?CO2 22  ?GLUCOSE 136*  ?BUN 12  ?CREATININE 0.76  ?CALCIUM 9.1  ? ?PT/INR ?No results for input(s): LABPROT, INR in the last 72 hours. ?ABG ?No results for input(s): PHART, HCO3 in the last 72 hours. ? ?Invalid input(s): PCO2, PO2 ? ?Studies/Results: ? ?Anti-infectives: ?Anti-infectives (From admission, onward)  ? ? Start     Dose/Rate Route Frequency Ordered Stop  ? 05/08/21 0600  ceFAZolin (ANCEF) IVPB 2g/100 mL premix       ? 2 g ?200 mL/hr over 30 Minutes Intravenous On call to O.R. 05/08/21 2542 05/08/21 0723  ? ?  ? ? ? ?Assessment/Plan: ?Patient Active Problem List  ? Diagnosis Date Noted  ? Papillary thyroid carcinoma ([place]) 05/01/2021  ? Pre-eclampsia, mild 12/15/2020  ? Migraine without aura and without status migrainosus, not intractable 04/14/2019  ? Anemia 03/12/2018  ? Umbilical hernia 70/62/3762  ? H/O pre-eclampsia in prior pregnancy, currently pregnant 08/19/2017  ? Thyroid nodule 08/19/2017  ? Cannabis abuse 08/16/2011  ? History of suicide attempt 08/15/2011   ? Anxiety 08/15/2011  ? Benzodiazepine (tranquilizer) overdose 08/14/2011  ? ?s/p Procedure(s): ?THYROIDECTOMY ?LIMITED LYMPH NODE DISSECTION 05/08/2021 ? ?Ca stable at 9.1 ?Recovering well ?Discussed things to watch out for moving forward ?Comfortable with and stable for discharge home today ? ? LOS: 0 days  ? ?Nadeen Landau, MD FACS ?[facility], A [facility] ? ?

## 2021-05-11 LAB — SURGICAL PATHOLOGY

## 2021-05-12 NOTE — Progress Notes (Signed)
Path shows the tumor at the size expected with negative margins (good news).  Really good news is all lymph nodes were negative!  Will forward to Dr. Kelton Pillar for review. ? ?Armandina Gemma, MD ?[facility] ?A [facility] practice ?Office: [phone] ? ?

## 2021-05-28 ENCOUNTER — Telehealth: Payer: Medicaid Other | Admitting: Physician Assistant

## 2021-05-28 DIAGNOSIS — Z20818 Contact with and (suspected) exposure to other bacterial communicable diseases: Secondary | ICD-10-CM | POA: Diagnosis not present

## 2021-05-28 DIAGNOSIS — J029 Acute pharyngitis, unspecified: Secondary | ICD-10-CM

## 2021-05-28 MED ORDER — AMOXICILLIN 500 MG PO TABS: 500.0000 mg | ORAL_TABLET | Freq: Two times a day (BID) | ORAL | 0 refills | Status: AC

## 2021-05-28 NOTE — Progress Notes (Signed)
I have spent 5 minutes in review of e-visit questionnaire, review and updating patient chart, medical decision making and response to patient.   Rachele Lamaster Cody Massiah Minjares, PA-C    

## 2021-05-28 NOTE — Progress Notes (Signed)

## 2021-06-12 ENCOUNTER — Encounter: Payer: Self-pay | Admitting: Family Medicine

## 2021-06-12 ENCOUNTER — Ambulatory Visit: Payer: Medicaid Other | Admitting: Family Medicine

## 2021-06-12 ENCOUNTER — Ambulatory Visit (INDEPENDENT_AMBULATORY_CARE_PROVIDER_SITE_OTHER): Payer: Medicaid Other | Admitting: Family Medicine

## 2021-06-12 VITALS — BP 118/70 | HR 64 | Temp 98.0°F | Ht 63.0 in | Wt 176.0 lb

## 2021-06-12 DIAGNOSIS — R052 Subacute cough: Secondary | ICD-10-CM | POA: Diagnosis not present

## 2021-06-12 MED ORDER — NORGESTIMATE-ETH ESTRADIOL 0.25-35 MG-MCG PO TABS: 1.0000 | ORAL_TABLET | Freq: Every day | ORAL | 11 refills | Status: DC

## 2021-06-12 MED ORDER — BENZONATATE 200 MG PO CAPS: 200.0000 mg | ORAL_CAPSULE | Freq: Two times a day (BID) | ORAL | 0 refills | Status: DC | PRN

## 2021-06-12 MED ORDER — FLUTICASONE PROPIONATE HFA 110 MCG/ACT IN AERO: 2.0000 | INHALATION_SPRAY | Freq: Two times a day (BID) | RESPIRATORY_TRACT | 0 refills | Status: DC

## 2021-06-12 NOTE — Progress Notes (Signed)
Chief Complaint  Patient presents with   Follow-up   Cough    Megan Webb here for URI complaints.  Duration: 3 days  Associated symptoms:  sometimes a productive cough, clear sputum Denies: sinus congestion, sinus pain, rhinorrhea, itchy watery eyes, ear pain, ear drainage, wheezing, shortness of breath, myalgia, and fevers Treatment to date: Amox for presumed strep that improved Sick contacts: Yes- daughter had strep  Past Medical History:  Diagnosis Date   Anemia    on Iron supplements   Anxiety    in the past; not current   Cancer ([place])    Cyst of right kidney    Depression    in the past; not current   Headache    has migraines, takes Extra Strength Tylenol with minimal relief   Hypertension    Hyperthyroidism    nodule on thyroid; but not diagnosed with hyperthyroidism   Pre-diabetes    Umbilical hernia 2683    Objective BP 118/70   Pulse 64   Temp 98 F (36.7 C) (Oral)   Ht '5\' 3"'$  (1.6 m)   Wt 176 lb (79.8 kg)   SpO2 99%   BMI 31.18 kg/m  General: Awake, alert, appears stated age HEENT: AT, [place], ears patent b/l and TM's neg, nares patent w/o discharge, pharynx pink and without exudates, MMM Neck: No masses or asymmetry Heart: RRR Lungs: CTAB, no accessory muscle use Psych: Age appropriate judgment and insight, normal mood and affect  Subacute cough - Plan: benzonatate (TESSALON) 200 MG capsule, fluticasone (FLOVENT HFA) 110 MCG/ACT inhaler  Could be post-infectious cough.  If no improvement next 4 to 6 weeks will consider PFTs versus pulmonology referral.  In the meantime, Flovent twice daily in addition to [employer] as needed.  Continue to push fluids, practice good hand hygiene, cover mouth when coughing. F/u prn. If starting to experience fevers, shaking, or shortness of breath, seek immediate care. Pt voiced understanding and agreement to the plan.  [place], DO 06/12/21 11:59 AM

## 2021-06-12 NOTE — Patient Instructions (Addendum)
OK to use Debrox (peroxide) in the ear to loosen up wax. Also recommend using a bulb syringe (for removing boogers from baby's noses) to flush through warm water and vinegar (3-4:1 ratio). An alternative, though more expensive, is an elephant ear washer wax removal kit. Do not use Q-tips as this can impact wax further.  Continue to push fluids, practice good hand hygiene, and cover your mouth if you cough.  If you start having fevers, shaking or shortness of breath, seek immediate care.  This should take a total of 6-8 weeks to clear.   Let us know if you need anything.

## 2021-06-22 ENCOUNTER — Telehealth: Payer: Medicaid Other | Admitting: Physician Assistant

## 2021-06-22 DIAGNOSIS — B3731 Acute candidiasis of vulva and vagina: Secondary | ICD-10-CM

## 2021-06-22 MED ORDER — FLUCONAZOLE 150 MG PO TABS: 150.0000 mg | ORAL_TABLET | ORAL | 0 refills | Status: DC | PRN

## 2021-06-22 NOTE — Progress Notes (Signed)

## 2021-07-03 ENCOUNTER — Ambulatory Visit (INDEPENDENT_AMBULATORY_CARE_PROVIDER_SITE_OTHER): Payer: Medicaid Other | Admitting: Internal Medicine

## 2021-07-03 ENCOUNTER — Encounter: Payer: Self-pay | Admitting: Internal Medicine

## 2021-07-03 VITALS — BP 130/82 | HR 94 | Ht 63.0 in | Wt 173.0 lb

## 2021-07-03 DIAGNOSIS — E890 Postprocedural hypothyroidism: Secondary | ICD-10-CM

## 2021-07-03 DIAGNOSIS — C73 Malignant neoplasm of thyroid gland: Secondary | ICD-10-CM | POA: Diagnosis not present

## 2021-07-03 NOTE — Progress Notes (Unsigned)
Name: Megan Webb  MRN/ DOB: [id number], 1995-10-12    Age/ Sex: 26 y.o., female    PCP: Shelda Pal, DO   Reason for Endocrinology Evaluation: Thyroid nodule      Date of Initial Endocrinology Evaluation: 10/15/2020    HPI: Megan Webb is a 26 y.o. female with a past medical history of Thyroid nodule . The patient presented for initial endocrinology clinic visit on 10/15/2020 for consultative assistance with her Thyroid nodule .      HISTORICAL SUMMARY:   n review of her records she has been diagnosed with left thyroid nodule 2.2 cm in 2018 and an FNA was recommended. I am unable to find records of FNA of the thyroid nodule but per pt she already had an FNA  through [facility] .  In 2021 repeat ultrasound confirmed left thyroid nodule and FNA was again recommended. 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  No extracapsular invasion   No family history of thyroid disease  SUBJECTIVE:     Today (07/03/21):  Megan Webb is here for a follow up on postoperative hypothyroidism and PTC  She has recovered well from the sx  Her appetite has decreased  She has alternating heat/cold intolerance  Has occasional palpitations  Has had occasional diarrhea  Not nursing  She is COC     Levothyroxine 100 mcg daily    HISTORY:  Past Medical History:  Past Medical History:  Diagnosis Date   Anemia    on Iron supplements    Anxiety    in the past; not current   Cancer ([place])    Cyst of right kidney    Depression    in the past; not current   Headache    has migraines, takes Extra Strength Tylenol with minimal relief   Hypertension    Hyperthyroidism    nodule on thyroid; but not diagnosed with hyperthyroidism   Pre-diabetes    Umbilical hernia [id number]   Past Surgical History:  Past Surgical History:  Procedure Laterality Date   BUNIONECTOMY     LYMPH NODE DISSECTION N/A 05/08/2021   Procedure: LIMITED LYMPH NODE DISSECTION;  Surgeon: Armandina Gemma, MD;  Location: [facility];  Service: General;  Laterality: N/A;   THYROIDECTOMY N/A 05/08/2021   Procedure: THYROIDECTOMY;  Surgeon: Armandina Gemma, MD;  Location: [facility];  Service: General;  Laterality: N/A;    Social History:  reports that she has never smoked. She has never used smokeless tobacco. She reports current alcohol use. She reports that she does not currently use drugs after having used the following drugs: Marijuana. Frequency: 1.00 time per week. Family History: family history includes Anemia in her mother; Arthritis in her maternal grandfather, maternal grandmother, and paternal grandfather; Asthma in her brother; Cancer in her maternal aunt; Cataracts in her father; Diabetes in her maternal aunt and maternal uncle; Hypertension in her maternal  grandfather, maternal grandmother, and mother; Kidney disease in her mother; Mental illness in her father and mother; Stroke in her maternal grandfather, maternal grandmother, and paternal grandfather.   HOME MEDICATIONS: Allergies as of 07/03/2021   No Known Allergies      Medication List        Accurate as of July 03, 2021  3:28 PM. If you have any questions, ask your nurse or doctor.          benzonatate 200 MG capsule Commonly known as: TESSALON Take 1 capsule (200 mg total) by mouth 2 (two) times daily as needed for cough.   ferrous sulfate 325 (65 FE) MG tablet Take 325 mg by mouth daily with  breakfast.   fluconazole 150 MG tablet Commonly known as: DIFLUCAN Take 1 tablet (150 mg total) by mouth every 3 (three) days as needed.   fluticasone 110 MCG/ACT inhaler Commonly known as: Flovent HFA Inhale 2 puffs into the lungs in the morning and at bedtime. Rinse mouth out after use.   hydrOXYzine 25 MG tablet Commonly known as: ATARAX Take 25 mg by mouth daily as needed.   levothyroxine 100 MCG tablet Commonly known as: Synthroid Take 1 tablet (100 mcg total) by mouth daily before breakfast.   norgestimate-ethinyl estradiol 0.25-35 MG-MCG tablet Commonly known as: ORTHO-CYCLEN Take 1 tablet by mouth daily.          REVIEW OF SYSTEMS: A comprehensive ROS was conducted with the patient and is negative except as per HPI    OBJECTIVE:  VS: BP 130/82 (BP Location: Left Arm, Patient Position: Sitting, Cuff Size: Normal)   Pulse 94   Ht '5\' 3"'  (1.6 m)   Wt 173 lb (78.5 kg)   SpO2 98%   BMI 30.65 kg/m    Wt Readings from Last 3 Encounters:  07/03/21 173 lb (78.5 kg)  06/12/21 176 lb (79.8 kg)  05/08/21 175 lb (79.4 kg)     EXAM: General: Pt appears well and is in NAD  Neck: General: Supple without adenopathy. Thyroid: NO nodule appreciated.  Lungs: Clear with good BS bilat with no rales, rhonchi, or wheezes  Heart: Auscultation: RRR.  Abdomen: Normoactive bowel sounds, soft, nontender, without masses or organomegaly palpable  Extremities:  BL LE: No pretibial edema normal ROM and strength.  Mental Status: Judgment, insight: Intact Orientation: Oriented to time, place, and person Mood and affect: No depression, anxiety, or agitation     DATA REVIEWED:  ****    Pathology report 05/08/2021   SURGICAL PATHOLOGY  CASE: [id number]  PATIENT: Megan Webb  Surgical Pathology Report      Clinical History: Papillary thyroid carcinoma (crm)      FINAL MICROSCOPIC DIAGNOSIS:   A. THYROID, TOTAL THYROIDECTOMY (26 g):  Papillary  carcinoma, classic subtype, involving the left lobe  Tumor limited to thyroid and measures 2.2 x 2.1 x 1.6 cm (pT2)  Background chronic thyroiditis   B. LYMPH NODE, CENTRAL COMPARTMENT, LEVEL VI, RESECTION:  Eleven benign reactive lymph nodes, negative for carcinoma (0/11, pN0)   ONCOLOGY TABLE:   THYROID GLAND, CARCINOMA: Resection   Procedure: Total thyroidectomy  Tumor Focality: Unifocal  Tumor Site: Left lobe  Tumor Size: 2.2 x 2.1 x 1.6 cm  Histologic Type: Papillary carcinoma, classic subtype  Angioinvasion: Not identified  Lymphatic Invasion: Not identified  Extrathyroidal Extension: Not identified  Margin Status: All margins negative for invasive carcinoma  Regional Lymph Node Status:       Number of Lymph Nodes  with Tumor: 0       Number of Lymph Nodes Examined: 11       Nodal Level(s) Examined: Level VI  Distant Metastasis: Not applicable  Pathologic Stage Classification (pTNM, AJCC 8th Edition): pT2, pN0  Ancillary Studies: Available upon request  Representative Tumor Block: A2  Comment(s): None     ASSESSMENT/PLAN/RECOMMENDATIONS:   PTC :Stage I   - S/P total thyroidectomy 04/2021 with 2.2 cm PTC  -Patient is at low risk for recurrence -Indeterminate treatment response at this time -We will proceed with TG, TG AB -Patient will be due for a repeat thyroid bed ultrasound October 2023 -TSH goal 0.5-2.0 u IU/mL   2.  Postoperative Hypothyroidism:  -Patient with nonspecific symptoms -- Pt educated extensively on the correct way to take levothyroxine (first thing in the morning with water, 30 minutes before eating or taking other medications). - Pt encouraged to double dose the following day if she were to miss a dose given long half-life of levothyroxine.  Medication Levothyroxine 100 mcg daily   Follow-up in 4 months  Signed electronically by: Mack Guise, MD  [facility] Endocrinology  [address] [place], [place] [address] Phone: [phone] FAX: [phone]   CC: Shelda Pal, [address] [place] [address] Phone: [phone] Fax: [phone]   Return to Endocrinology clinic as below: Future Appointments  Date Time Provider [place]  08/05/2021  1:00 PM [facility] [place] [facility]  12/14/2021  9:45 AM Nani Ravens, Crosby Oyster, DO [facility]  02/10/2022  3:40 PM Sherylann Vangorden, Melanie Crazier, MD [facility] None

## 2021-07-03 NOTE — Patient Instructions (Signed)

## 2021-07-06 LAB — THYROGLOBULIN LEVEL: Thyroglobulin: 0.2 ng/mL — ABNORMAL LOW

## 2021-07-06 LAB — BASIC METABOLIC PANEL
BUN: 21 mg/dL (ref 7–25)
CO2: 21 mmol/L (ref 20–32)
Calcium: 9.4 mg/dL (ref 8.6–10.2)
Chloride: 110 mmol/L (ref 98–110)
Creat: 0.72 mg/dL (ref 0.50–0.96)
Glucose, Bld: 91 mg/dL (ref 65–99)
Potassium: 4 mmol/L (ref 3.5–5.3)
Sodium: 137 mmol/L (ref 135–146)

## 2021-07-06 LAB — THYROGLOBULIN ANTIBODY: Thyroglobulin Ab: 1 IU/mL (ref <=1)

## 2021-07-06 LAB — ALBUMIN: Albumin: 4.6 g/dL (ref 3.6–5.1)

## 2021-07-06 LAB — TSH: TSH: 6.12 mIU/L — ABNORMAL HIGH

## 2021-07-06 MED ORDER — LEVOTHYROXINE SODIUM 125 MCG PO TABS: 125.0000 ug | ORAL_TABLET | Freq: Every day | ORAL | 3 refills | Status: DC

## 2021-07-10 ENCOUNTER — Other Ambulatory Visit: Payer: Self-pay | Admitting: Family Medicine

## 2021-07-10 DIAGNOSIS — R052 Subacute cough: Secondary | ICD-10-CM

## 2021-07-30 ENCOUNTER — Encounter: Payer: Self-pay | Admitting: Family Medicine

## 2021-07-30 ENCOUNTER — Other Ambulatory Visit: Payer: Self-pay

## 2021-07-30 ENCOUNTER — Other Ambulatory Visit: Payer: Self-pay | Admitting: Family Medicine

## 2021-07-30 DIAGNOSIS — B3731 Acute candidiasis of vulva and vagina: Secondary | ICD-10-CM

## 2021-07-30 DIAGNOSIS — B379 Candidiasis, unspecified: Secondary | ICD-10-CM

## 2021-07-30 MED ORDER — FLUCONAZOLE 150 MG PO TABS: ORAL_TABLET | ORAL | 1 refills | Status: DC

## 2021-07-30 MED ORDER — FLUCONAZOLE 150 MG PO TABS: 150.0000 mg | ORAL_TABLET | ORAL | 0 refills | Status: DC | PRN

## 2021-07-30 NOTE — Progress Notes (Signed)
Patient requested diflucan per My chart message. Kathrene Alu RN

## 2021-07-30 NOTE — Progress Notes (Signed)
Pt sent Mychart message requesting a Rx for Diflucan. Pt states she is having thick discharge with vaginal itching and she thinks she has a yeast infection. Diflucan 150 mg PO once was sent to her pharmacy. Nyisha Clippard l Nyeemah Jennette, CMA

## 2021-08-05 ENCOUNTER — Other Ambulatory Visit: Payer: Managed Care, Other (non HMO)

## 2021-10-16 ENCOUNTER — Telehealth: Payer: Medicaid Other | Admitting: Physician Assistant

## 2021-10-16 DIAGNOSIS — B3731 Acute candidiasis of vulva and vagina: Secondary | ICD-10-CM | POA: Diagnosis not present

## 2021-10-16 MED ORDER — CLOTRIMAZOLE 3 2 % VA CREA: 1.0000 | TOPICAL_CREAM | Freq: Every day | VAGINAL | 0 refills | Status: DC

## 2021-10-16 MED ORDER — FLUCONAZOLE 150 MG PO TABS: 150.0000 mg | ORAL_TABLET | ORAL | 0 refills | Status: DC | PRN

## 2021-10-16 NOTE — Progress Notes (Signed)

## 2021-10-17 ENCOUNTER — Encounter: Payer: Self-pay | Admitting: Internal Medicine

## 2021-11-02 ENCOUNTER — Ambulatory Visit: Payer: Medicaid Other | Admitting: Internal Medicine

## 2021-11-02 NOTE — Progress Notes (Deleted)
Name: Megan Webb  MRN/ DOB: [id number], 11/27/1995    Age/ Sex: 26 y.o., female    PCP: Shelda Pal, DO   Reason for Endocrinology Evaluation: Thyroid nodule      Date of Initial Endocrinology Evaluation: 10/15/2020    HPI: [name] is a 26 y.o. female with a past medical history of Thyroid nodule . The patient presented for initial endocrinology clinic visit on 10/15/2020 for consultative assistance with her Thyroid nodule .      HISTORICAL SUMMARY:   n review of her records she has been diagnosed with left thyroid nodule 2.2 cm in 2018 and an FNA was recommended. I am unable to find records of FNA of the thyroid nodule but per pt she already had an FNA  through [facility] .  In 2021 repeat ultrasound confirmed left thyroid nodule and FNA was again recommended. 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  No extracapsular invasion   No family history of thyroid disease  SUBJECTIVE:     Today (11/02/21):  [name] is here for a follow up on postoperative hypothyroidism and PTC  She has recovered well from the sx  Her appetite has decreased  She has alternating heat/cold intolerance  Has occasional palpitations  Has had occasional diarrhea  Not nursing  She is COC     Levothyroxine 125 mcg daily    HISTORY:  Past Medical History:  Past Medical History:  Diagnosis Date   Anemia    on Iron supplements    Anxiety    in the past; not current   Cancer ([place])    Cyst of right kidney    Depression    in the past; not current   Headache    has migraines, takes Extra Strength Tylenol with minimal relief   Hypertension    Hyperthyroidism    nodule on thyroid; but not diagnosed with hyperthyroidism   Pre-diabetes    Umbilical hernia 3646   Past Surgical History:  Past Surgical History:  Procedure Laterality Date   BUNIONECTOMY     LYMPH NODE DISSECTION N/A 05/08/2021   Procedure: LIMITED LYMPH NODE DISSECTION;  Surgeon: Armandina Gemma, MD;  [facility];  Service: General;  Laterality: N/A;   THYROIDECTOMY N/A 05/08/2021   Procedure: THYROIDECTOMY;  Surgeon: Armandina Gemma, MD;  Location: [facility];  Service: General;  Laterality: N/A;    Social History:  reports that she has never smoked. She has never used smokeless tobacco. She reports current alcohol use. She reports that she does not currently use drugs after having used the following drugs: Marijuana. Frequency: 1.00 time per week. Family History: family history includes Anemia in her mother; Arthritis in her maternal grandfather, maternal grandmother, and paternal grandfather; Asthma in her brother; Cancer in her maternal aunt; Cataracts in her father; Diabetes in her maternal aunt and maternal uncle; Hypertension in her maternal  grandfather, maternal grandmother, and mother; Kidney disease in her mother; Mental illness in her father and mother; Stroke in her maternal grandfather, maternal grandmother, and paternal grandfather.   HOME MEDICATIONS: Allergies as of 11/02/2021   No Known Allergies      Medication List        Accurate as of November 02, 2021 10:58 AM. If you have any questions, ask your nurse or doctor.          benzonatate 200 MG capsule Commonly known as: TESSALON Take 1 capsule (200 mg total) by mouth 2 (two) times daily as needed for cough.   Clotrimazole 3 2 % vaginal cream Generic drug: clotrimazole Place 1  Applicatorful vaginally at bedtime.   ferrous sulfate 325 (65 FE) MG tablet Take 325 mg by mouth daily with breakfast.   Flovent HFA 110 MCG/ACT inhaler Generic drug: fluticasone INHALE 2 PUFFS INTO THE LUNGS IN THE MORNING AND AT BEDTIME. RINSE MOUTH AFTER USE   fluconazole 150 MG tablet Commonly known as: DIFLUCAN Take 1 tablet (150 mg total) by mouth every 3 (three) days as needed.   hydrOXYzine 25 MG tablet Commonly known as: ATARAX Take 25 mg by mouth daily as needed.   levothyroxine 125 MCG tablet Commonly known as: SYNTHROID Take 1 tablet (125 mcg total) by mouth daily.   norgestimate-ethinyl estradiol 0.25-35 MG-MCG tablet Commonly known as: ORTHO-CYCLEN Take 1 tablet by mouth daily.          REVIEW OF SYSTEMS: A comprehensive ROS was conducted with the patient and is negative except as per HPI    OBJECTIVE:  VS: There were no vitals taken for this visit.   Wt Readings from Last 3 Encounters:  07/03/21 173 lb (78.5 kg)  06/12/21 176 lb (79.8 kg)  05/08/21 175 lb (79.4 kg)     EXAM: General: Pt appears well and is in NAD  Neck: General: Supple without adenopathy. Thyroid: NO nodule appreciated.  Lungs: Clear with good BS bilat with no rales, rhonchi, or wheezes  Heart: Auscultation: RRR.  Abdomen: Normoactive bowel sounds, soft, nontender, without masses or organomegaly palpable  Extremities:  BL LE: No pretibial edema normal ROM and strength.  Mental Status: Judgment, insight: Intact Orientation: Oriented to time, place, and person Mood and affect: No depression, anxiety, or agitation     DATA REVIEWED:   Latest Reference Range & Units 07/03/21 15:33  Sodium 135 - 146 mmol/L 137  Potassium 3.5 - 5.3 mmol/L 4.0  Chloride 98 - 110 mmol/L 110  CO2 20 - 32 mmol/L 21  Glucose 65 - 99 mg/dL 91  BUN 7 - 25 mg/dL 21  Creatinine 0.50 - 0.96 mg/dL 0.72  Calcium 8.6 - 10.2 mg/dL 9.4  BUN/Creatinine Ratio 6 - 22 (calc) NOT APPLICABLE  TSH mIU/L 4.09  (H)  (H): Data is abnormally high   Pathology report 05/08/2021   SURGICAL PATHOLOGY  CASE: [id number]  PATIENT: Megan Webb  Surgical Pathology Report      Clinical History: Papillary thyroid carcinoma (crm)      FINAL MICROSCOPIC DIAGNOSIS:   A. THYROID, TOTAL THYROIDECTOMY (26 g):  Papillary carcinoma, classic subtype, involving the left lobe  Tumor limited to thyroid and measures 2.2 x 2.1 x 1.6 cm (pT2)  Background chronic thyroiditis   B. LYMPH NODE, CENTRAL COMPARTMENT, LEVEL VI, RESECTION:  Eleven benign reactive lymph nodes, negative for carcinoma (0/11, pN0)   ONCOLOGY TABLE:   THYROID GLAND, CARCINOMA: Resection   Procedure: Total thyroidectomy  Tumor Focality: Unifocal  Tumor Site: Left lobe  Tumor Size: 2.2 x 2.1 x 1.6 cm  Histologic Type: Papillary carcinoma, classic subtype  Angioinvasion: Not identified  Lymphatic Invasion: Not identified  Extrathyroidal Extension: Not identified  Margin Status: All margins negative for invasive carcinoma  Regional Lymph Node Status:       Number of Lymph Nodes with Tumor: 0       Number of Lymph Nodes Examined: 11       Nodal Level(s) Examined: Level VI  Distant Metastasis: Not applicable  Pathologic Stage Classification (pTNM, AJCC 8th Edition): pT2, pN0  Ancillary Studies: Available upon request  Representative Tumor Block: A2  Comment(s): None     ASSESSMENT/PLAN/RECOMMENDATIONS:   PTC :Stage I   - S/P total thyroidectomy 04/2021 with 2.2 cm PTC  -Patient is at low risk for recurrence -Indeterminate treatment response at this time -We will proceed with TG, TG AB -Patient will be due for a repeat thyroid bed ultrasound October 2023 -TSH goal 0.5-2.0 u IU/mL - BMP is normal but TSH above goal, will increase as below    2.  Postoperative Hypothyroidism:  -Patient with nonspecific symptoms -- Pt educated extensively on the correct way to take levothyroxine (first thing in the  morning with water, 30 minutes before eating or taking other medications). - Pt encouraged to double dose the following day if she were to miss a dose given long half-life of levothyroxine. - TSh above goal , will increase       Medication Stop Levothyroxine 100 mcg daily Start Levothyroxine 125 mcg daily    Follow-up in 4 months  Signed electronically by: Mack Guise, MD  [facility] Endocrinology  [address] [place], [place] [address] Phone: [phone] FAX: [phone]   CC: Shelda Pal, [address] [place] [address] Phone: [phone] Fax: [phone]   Return to Endocrinology clinic as below: Future Appointments  Date Time Provider [place]  11/02/2021  2:20 PM Antonius Hartlage, Melanie Crazier, MD [facility] None  12/14/2021  9:45 AM Nani Ravens, Crosby Oyster, DO [facility] [place]

## 2021-11-15 ENCOUNTER — Other Ambulatory Visit: Payer: Self-pay | Admitting: Family Medicine

## 2021-12-06 ENCOUNTER — Telehealth: Payer: Medicaid Other | Admitting: Emergency Medicine

## 2021-12-06 DIAGNOSIS — B3731 Acute candidiasis of vulva and vagina: Secondary | ICD-10-CM | POA: Diagnosis not present

## 2021-12-06 MED ORDER — FLUCONAZOLE 150 MG PO TABS: 150.0000 mg | ORAL_TABLET | ORAL | 0 refills | Status: DC | PRN

## 2021-12-06 NOTE — Progress Notes (Signed)
E-Visit for Vaginal Symptoms  We are sorry that you are not feeling well. Here is how we plan to help! Based on what you shared with me it looks like you: May have a yeast vaginosis  Please talk with your regular healthcare provider about your frequent yeast infections.   Vaginosis is an inflammation of the vagina that can result in discharge, itching and pain. The cause is usually a change in the normal balance of vaginal bacteria or an infection. Vaginosis can also result from reduced estrogen levels after menopause.  The most common causes of vaginosis are:   Bacterial vaginosis which results from an overgrowth of one on several organisms that are normally present in your vagina.   Yeast infections which are caused by a naturally occurring fungus called candida.   Vaginal atrophy (atrophic vaginosis) which results from the thinning of the vagina from reduced estrogen levels after menopause.   Trichomoniasis which is caused by a parasite and is commonly transmitted by sexual intercourse.  Factors that increase your risk of developing vaginosis include: Medications, such as antibiotics and steroids Uncontrolled diabetes Use of hygiene products such as bubble bath, vaginal spray or vaginal deodorant Douching Wearing damp or tight-fitting clothing Using an intrauterine device (IUD) for birth control Hormonal changes, such as those associated with pregnancy, birth control pills or menopause Sexual activity Having a sexually transmitted infection  Your treatment plan is A single Diflucan (fluconazole) '150mg'$  tablet once.  I have electronically sent this prescription into the pharmacy that you have chosen. I have also sent in a 2nd dose.   Be sure to take all of the medication as directed. Stop taking any medication if you develop a rash, tongue swelling or shortness of breath. Mothers who are breast feeding should consider pumping and discarding their breast milk while on these  antibiotics. However, there is no consensus that infant exposure at these doses would be harmful.  Remember that medication creams can weaken latex condoms. Marland Kitchen   HOME CARE:  Good hygiene may prevent some types of vaginosis from recurring and may relieve some symptoms:  Avoid baths, hot tubs and whirlpool spas. Rinse soap from your outer genital area after a shower, and dry the area well to prevent irritation. Don't use scented or harsh soaps, such as those with deodorant or antibacterial action. Avoid irritants. These include scented tampons and pads. Wipe from front to back after using the toilet. Doing so avoids spreading fecal bacteria to your vagina.  Other things that may help prevent vaginosis include:  Don't douche. Your vagina doesn't require cleansing other than normal bathing. Repetitive douching disrupts the normal organisms that reside in the vagina and can actually increase your risk of vaginal infection. Douching won't clear up a vaginal infection. Use a latex condom. Both female and female latex condoms may help you avoid infections spread by sexual contact. Wear cotton underwear. Also wear pantyhose with a cotton crotch. If you feel comfortable without it, skip wearing underwear to bed. Yeast thrives in [employer] Your symptoms should improve in the next day or two.  GET HELP RIGHT AWAY IF:  You have pain in your lower abdomen ( pelvic area or over your ovaries) You develop nausea or vomiting You develop a fever Your discharge changes or worsens You have persistent pain with intercourse You develop shortness of breath, a rapid pulse, or you faint.  These symptoms could be signs of problems or infections that need to be evaluated by a medical provider  now.  MAKE SURE YOU   Understand these instructions. Will watch your condition. Will get help right away if you are not doing well or get worse.  Thank you for choosing an e-visit.  Your e-visit answers were  reviewed by a board certified advanced clinical practitioner to complete your personal care plan. Depending upon the condition, your plan could have included both over the counter or prescription medications.  Please review your pharmacy choice. Make sure the pharmacy is open so you can pick up prescription now. If there is a problem, you may contact your provider through [employer] and have the prescription routed to another pharmacy.  Your safety is important to [place]. If you have drug allergies check your prescription carefully.   For the next 24 hours you can use MyChart to ask questions about today's visit, request a non-urgent call back, or ask for a work or school excuse. You will get an email in the next two days asking about your experience. I hope that your e-visit has been valuable and will speed your recovery.  I have spent 5 minutes in review of e-visit questionnaire, review and updating patient chart, medical decision making and response to patient.   Willeen Cass, PhD, FNP-BC

## 2021-12-14 ENCOUNTER — Encounter: Payer: Medicaid Other | Admitting: Family Medicine

## 2021-12-23 ENCOUNTER — Encounter: Payer: Medicaid Other | Admitting: Family Medicine

## 2022-01-20 ENCOUNTER — Encounter: Payer: Self-pay | Admitting: Family Medicine

## 2022-01-20 ENCOUNTER — Other Ambulatory Visit: Payer: Self-pay

## 2022-01-21 ENCOUNTER — Ambulatory Visit: Payer: Medicaid Other | Admitting: Family Medicine

## 2022-01-21 VITALS — BP 142/80 | HR 71 | Ht 63.0 in | Wt 159.0 lb

## 2022-01-21 DIAGNOSIS — N939 Abnormal uterine and vaginal bleeding, unspecified: Secondary | ICD-10-CM | POA: Diagnosis not present

## 2022-01-21 MED ORDER — DROSPIRENONE-ETHINYL ESTRADIOL 3-0.03 MG PO TABS: 1.0000 | ORAL_TABLET | Freq: Every day | ORAL | 3 refills | Status: DC

## 2022-01-21 NOTE — Progress Notes (Signed)
   Subjective:    Patient ID: Megan Webb, female    DOB: 07/14/1995, 26 y.o.   MRN: [id number]  HPI  Patient experiencing 3 weeks of bleeding. Currently on ortho-cyclen - has been on this for about 6-7 months without problems. Has continued to take the COCs. Had possible lump in lower left vaginal wall.  Did have thyroidectomy for thyroid carcinoma. Is currently on levothyroxine - still adjusting dose.  Review of Systems     Objective:   Physical Exam Vitals reviewed. Exam conducted with a chaperone present.  Constitutional:      Appearance: Normal appearance.  Genitourinary:    Labia:        Right: No rash, tenderness or lesion.        Left: No rash, tenderness or lesion.      Comments: Slight fullness on the left vulva. No distinct mass currently. No pain. Neurological:     General: No focal deficit present.     Mental Status: She is alert.  Psychiatric:        Mood and Affect: Mood normal.        Behavior: Behavior normal.        Thought Content: Thought content normal.        Judgment: Judgment normal.        Assessment & Plan:  1. Abnormal uterine bleeding (AUB) Stop COCs now. Will restart yasmin in 10 days when she is supposed to restart. If she continues to have bleeding, will do COC taper.

## 2022-02-10 ENCOUNTER — Ambulatory Visit: Payer: Managed Care, Other (non HMO) | Admitting: Internal Medicine

## 2022-03-24 ENCOUNTER — Telehealth: Payer: Medicaid Other | Admitting: Nurse Practitioner

## 2022-03-24 DIAGNOSIS — B3731 Acute candidiasis of vulva and vagina: Secondary | ICD-10-CM

## 2022-03-24 MED ORDER — FLUCONAZOLE 150 MG PO TABS: ORAL_TABLET | ORAL | 0 refills | Status: DC

## 2022-03-24 NOTE — Progress Notes (Signed)

## 2022-04-05 ENCOUNTER — Encounter: Payer: Self-pay | Admitting: Internal Medicine

## 2022-04-05 ENCOUNTER — Ambulatory Visit: Payer: Medicaid Other | Admitting: Internal Medicine

## 2022-04-05 VITALS — BP 122/70 | HR 69 | Ht 63.0 in | Wt 163.0 lb

## 2022-04-05 DIAGNOSIS — C73 Malignant neoplasm of thyroid gland: Secondary | ICD-10-CM | POA: Diagnosis not present

## 2022-04-05 DIAGNOSIS — E890 Postprocedural hypothyroidism: Secondary | ICD-10-CM

## 2022-04-05 DIAGNOSIS — L299 Pruritus, unspecified: Secondary | ICD-10-CM

## 2022-04-05 LAB — TSH: TSH: 4.23 u[IU]/mL (ref 0.35–5.50)

## 2022-04-05 NOTE — Patient Instructions (Signed)

## 2022-04-05 NOTE — Progress Notes (Unsigned)
Name: Megan Webb  MRN/ DOB: [id number], 1995-03-01    Age/ Sex: 27 y.o., female    PCP: Shelda Pal, DO   Reason for Endocrinology Evaluation: Thyroid nodule      Date of Initial Endocrinology Evaluation: 10/15/2020    HPI: [name] is a 27 y.o. female with a past medical history of Thyroid nodule . The patient presented for initial endocrinology clinic visit on 10/15/2020 for consultative assistance with her Thyroid nodule .      HISTORICAL SUMMARY:   n review of her records she has been diagnosed with left thyroid nodule 2.2 cm in 2018 and an FNA was recommended. I am unable to find records of FNA of the thyroid nodule but per pt she already had an FNA  through [facility] .  In 2021 repeat ultrasound confirmed left thyroid nodule and FNA was again recommended. 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  No extracapsular invasion   No family history of thyroid disease  SUBJECTIVE:     Today (04/05/22):  [name] is here for a follow up on postoperative hypothyroidism and PTC  Weight has been stable  No local swelling  She gets itchy skin and hives occasionally  Has noted occasional constipation  Has occasional palpitations  She is COC   Levothyroxine 125 mcg daily    HISTORY:  Past Medical History:  Past Medical History:  Diagnosis Date   Anemia    on Iron supplements   Anxiety    in the past; not  current   Cancer ([place])    Cyst of right kidney    Depression    in the past; not current   Headache    has migraines, takes Extra Strength Tylenol with minimal relief   Hypertension    Hyperthyroidism    nodule on thyroid; but not diagnosed with hyperthyroidism   Pre-diabetes    Umbilical hernia [id number]   Past Surgical History:  Past Surgical History:  Procedure Laterality Date   BUNIONECTOMY     LYMPH NODE DISSECTION N/A 05/08/2021   Procedure: LIMITED LYMPH NODE DISSECTION;  Surgeon: Armandina Gemma, MD;  Location: [facility];  Service: General;  Laterality: N/A;   THYROIDECTOMY N/A 05/08/2021   Procedure: THYROIDECTOMY;  Surgeon: Armandina Gemma, MD;  Location: [facility];  Service: General;  Laterality: N/A;    Social History:  reports that she has never smoked. She has never used smokeless tobacco. She reports current alcohol use. She reports that she does not currently use drugs after having used the following drugs: Marijuana. Frequency: 1.00 time per week. Family History: family history includes Anemia in her mother; Arthritis in her maternal grandfather, maternal grandmother, and paternal grandfather; Asthma in her brother; Cancer in her maternal aunt; Cataracts in her father; Diabetes in her maternal aunt and maternal uncle; Hypertension in her maternal grandfather, maternal grandmother, and mother; Kidney disease  in her mother; Mental illness in her father and mother; Stroke in her maternal grandfather, maternal grandmother, and paternal grandfather.   HOME MEDICATIONS: Allergies as of 04/05/2022   No Known Allergies      Medication List        Accurate as of April 05, 2022  2:12 PM. If you have any questions, ask your nurse or doctor.          STOP taking these medications    benzonatate 200 MG capsule Commonly known as: TESSALON Stopped by: Dorita Sciara, MD   fluconazole 150 MG tablet Commonly known as: Diflucan Stopped by: Dorita Sciara, MD       TAKE  these medications    drospirenone-ethinyl estradiol 3-0.03 MG tablet Commonly known as: Yasmin 28 Take 1 tablet by mouth daily.   ferrous sulfate 325 (65 FE) MG tablet Take 325 mg by mouth daily with breakfast.   levothyroxine 125 MCG tablet Commonly known as: SYNTHROID Take 1 tablet (125 mcg total) by mouth daily.          REVIEW OF SYSTEMS: A comprehensive ROS was conducted with the patient and is negative except as per HPI    OBJECTIVE:  VS: BP 122/70 (BP Location: Left Arm, Patient Position: Sitting, Cuff Size: Small)   Pulse 69   Ht '5\' 3"'$  (1.6 m)   Wt 163 lb (73.9 kg)   SpO2 99%   BMI 28.87 kg/m    Wt Readings from Last 3 Encounters:  04/05/22 163 lb (73.9 kg)  01/21/22 159 lb (72.1 kg)  07/03/21 173 lb (78.5 kg)     EXAM: General: Pt appears well and is in NAD  Neck: General: Supple without adenopathy. Thyroid: NO nodule appreciated.  Lungs: Clear with good BS bilat with no rales, rhonchi, or wheezes  Heart: Auscultation: RRR.  Abdomen: Normoactive bowel sounds, soft, nontender, without masses or organomegaly palpable  Extremities:  BL LE: No pretibial edema normal ROM and strength.  Mental Status: Judgment, insight: Intact Orientation: Oriented to time, place, and person Mood and affect: No depression, anxiety, or agitation     DATA REVIEWED:   Latest Reference Range & Units 07/03/21 15:33  Sodium 135 - 146 mmol/L 137  Potassium 3.5 - 5.3 mmol/L 4.0  Chloride 98 - 110 mmol/L 110  CO2 20 - 32 mmol/L 21  Glucose 65 - 99 mg/dL 91  BUN 7 - 25 mg/dL 21  Creatinine 0.50 - 0.96 mg/dL 0.72  Calcium 8.6 - 10.2 mg/dL 9.4  BUN/Creatinine Ratio 6 - 22 (calc) NOT APPLICABLE  TSH mIU/L 99991111 (H)  (H): Data is abnormally high   Pathology report 05/08/2021   SURGICAL PATHOLOGY  CASE: [id number]  PATIENT: Megan Webb  Surgical Pathology Report      Clinical History: Papillary thyroid carcinoma (crm)      FINAL MICROSCOPIC  DIAGNOSIS:   A. THYROID, TOTAL THYROIDECTOMY (26 g):  Papillary carcinoma, classic subtype, involving the left lobe  Tumor limited to thyroid and measures 2.2 x 2.1 x 1.6 cm (pT2)  Background chronic thyroiditis   B. LYMPH NODE, CENTRAL COMPARTMENT, LEVEL VI, RESECTION:  Eleven benign reactive lymph nodes, negative for carcinoma (0/11, pN0)   ONCOLOGY TABLE:   THYROID GLAND, CARCINOMA: Resection   Procedure: Total thyroidectomy  Tumor Focality: Unifocal  Tumor Site: Left lobe  Tumor Size: 2.2 x 2.1 x 1.6 cm  Histologic Type: Papillary carcinoma, classic subtype  Angioinvasion: Not identified  Lymphatic Invasion: Not identified  Extrathyroidal  Extension: Not identified  Margin Status: All margins negative for invasive carcinoma  Regional Lymph Node Status:       Number of Lymph Nodes with Tumor: 0       Number of Lymph Nodes Examined: 11       Nodal Level(s) Examined: Level VI  Distant Metastasis: Not applicable  Pathologic Stage Classification (pTNM, AJCC 8th Edition): pT2, pN0  Ancillary Studies: Available upon request  Representative Tumor Block: A2  Comment(s): None     ASSESSMENT/PLAN/RECOMMENDATIONS:   PTC :Stage I   - S/P total thyroidectomy 04/2021 with 2.2 cm PTC  -Patient is at low risk for recurrence -Indeterminate treatment response at this time -We will proceed with TG, TG AB -Patient will be due for a repeat thyroid bed ultrasound October 2023 -TSH goal 0.5-2.0 u IU/mL - BMP is normal but TSH above goal, will increase as below    2.  Postoperative Hypothyroidism:  -Patient with nonspecific symptoms -- Pt educated extensively on the correct way to take levothyroxine (first thing in the morning with water, 30 minutes before eating or taking other medications). - Pt encouraged to double dose the following day if she were to miss a dose given long half-life of levothyroxine. - TSh above goal , will increase       Medication Stop Levothyroxine 100  mcg daily Start Levothyroxine 125 mcg daily    Follow-up in 4 months  Signed electronically by: Mack Guise, MD  [facility] Endocrinology  [address] [place], [place] [address] Phone: [phone] FAX: [phone]   CC: Shelda Pal, [address] [place] [address] Phone: [phone] Fax: [phone]   Return to Endocrinology clinic as below: Future Appointments  Date Time Provider [place]  04/19/2022  2:30 PM Shelda Pal, DO [facility] [place]

## 2022-04-06 LAB — THYROGLOBULIN LEVEL: Thyroglobulin: 0.1 ng/mL — ABNORMAL LOW

## 2022-04-06 LAB — THYROGLOBULIN ANTIBODY: Thyroglobulin Ab: 2 IU/mL — ABNORMAL HIGH (ref <=1)

## 2022-04-06 MED ORDER — LEVOTHYROXINE SODIUM 150 MCG PO TABS: 150.0000 ug | ORAL_TABLET | Freq: Every day | ORAL | 3 refills | Status: DC

## 2022-04-07 ENCOUNTER — Encounter: Payer: Self-pay | Admitting: Internal Medicine

## 2022-04-19 ENCOUNTER — Ambulatory Visit (INDEPENDENT_AMBULATORY_CARE_PROVIDER_SITE_OTHER): Payer: Medicaid Other | Admitting: Family Medicine

## 2022-04-19 ENCOUNTER — Ambulatory Visit (HOSPITAL_BASED_OUTPATIENT_CLINIC_OR_DEPARTMENT_OTHER): Payer: Medicaid Other

## 2022-04-19 ENCOUNTER — Encounter: Payer: Self-pay | Admitting: Family Medicine

## 2022-04-19 VITALS — BP 108/62 | HR 85 | Temp 98.7°F | Ht 63.0 in | Wt 157.1 lb

## 2022-04-19 DIAGNOSIS — Z0000 Encounter for general adult medical examination without abnormal findings: Secondary | ICD-10-CM

## 2022-04-19 NOTE — Progress Notes (Signed)
Chief Complaint  Patient presents with   Annual Exam     Well Woman Megan Webb is here for a complete physical.   Her last physical was >1 year ago.  Current diet: in general, trying to keep a healthy diet. Current exercise: walking, lifting wts, cardio. Fatigue out of ordinary? No Seatbelt? Yes Advanced directive? No  Health Maintenance Pap/HPV- Yes Tetanus- Yes HIV screening- Yes Hep C screening- Yes  Past Medical History:  Diagnosis Date   Anemia    on Iron supplements   Anxiety    in the past; not current   Cancer ([place])    Cyst of right kidney    Depression    in the past; not current   Headache    has migraines, takes Extra Strength Tylenol with minimal relief   Hypertension    Hyperthyroidism    nodule on thyroid; but not diagnosed with hyperthyroidism   Pre-diabetes    Umbilical hernia [id number]     Past Surgical History:  Procedure Laterality Date   BUNIONECTOMY     LYMPH NODE DISSECTION N/A 05/08/2021   Procedure: LIMITED LYMPH NODE DISSECTION;  Surgeon: Armandina Gemma, MD;  [facility];  Service: General;  Laterality: N/A;   THYROIDECTOMY N/A 05/08/2021   Procedure: THYROIDECTOMY;  Surgeon: Armandina Gemma, MD;  Location: [facility];  Service: General;  Laterality: N/A;    Medications  Current Outpatient Medications on File Prior to Visit  Medication Sig Dispense Refill   drospirenone-ethinyl estradiol (YASMIN 28) 3-0.03 MG tablet Take 1 tablet by mouth daily. 84 tablet 3   ferrous sulfate 325 (65 FE) MG tablet Take 325 mg by mouth daily with breakfast.     levothyroxine (SYNTHROID) 150 MCG tablet Take 1 tablet (150 mcg total) by mouth daily. 90 tablet 3   Allergies No Known Allergies  Review of Systems: Constitutional:  no unexpected weight changes Eye:  no recent significant change in vision Ear/Nose/Mouth/Throat:  Ears:  no tinnitus or vertigo and no recent change in hearing Nose/Mouth/Throat:  no complaints of nasal congestion, no sore  throat Cardiovascular: no chest pain Respiratory:  no cough and no shortness of breath Gastrointestinal:  no abdominal pain, no change in bowel habits GU:  Female: negative for dysuria or pelvic pain Musculoskeletal/Extremities:  no pain of the joints Integumentary (Skin/Breast):  no abnormal skin lesions reported Neurologic:  no headaches Endocrine:  denies fatigue Hematologic/Lymphatic:  No areas of easy bleeding  Exam BP 108/62 (BP Location: Left Arm, Patient Position: Sitting, Cuff Size: Normal)   Pulse 85   Temp 98.7 F (37.1 C) (Oral)   Ht 5\' 3"  (1.6 m)   Wt 157 lb 2 oz (71.3 kg)   SpO2 98%   BMI 27.83 kg/m  General:  well developed, well nourished, in no apparent distress Skin:  no significant moles, warts, or growths Head:  no masses, lesions, or tenderness Eyes:  pupils equal and round, sclera anicteric without injection Ears:  canals without lesions, TMs shiny without retraction, no obvious effusion, no erythema Nose:  nares patent, mucosa normal, and no drainage  Throat/Pharynx:  lips and gingiva without lesion; tongue and uvula midline; non-inflamed pharynx; no exudates or postnasal drainage Neck: neck supple without adenopathy, thyromegaly, or masses Lungs:  clear to auscultation, breath sounds equal bilaterally, no respiratory distress Cardio:  regular rate and rhythm, no bruits, no LE edema Abdomen:  abdomen soft, nontender; bowel sounds normal; no masses or organomegaly Genital: Defer to GYN Musculoskeletal:  symmetrical  muscle groups noted without atrophy or deformity Extremities:  no clubbing, cyanosis, or edema, no deformities, no skin discoloration Neuro:  gait normal; deep tendon reflexes normal and symmetric Psych: well oriented with normal range of affect and appropriate judgment/insight  Assessment and Plan  Well adult exam - Plan: CBC, Comprehensive metabolic panel, Lipid panel   Well 27 y.o. female. Counseled on diet and exercise. Advanced  directive form provided today.  Other orders as above. Follow up in 1 yr. The patient voiced understanding and agreement to the plan.  [place], DO 04/19/22 2:43 PM

## 2022-04-19 NOTE — Patient Instructions (Addendum)
Give us 2-3 business days to get the results of your labs back.  ? ?Keep the diet clean and stay active. ? ?Please get me a copy of your advanced directive form at your convenience.  ? ?OK to use Debrox (peroxide) in the ear to loosen up wax. Also recommend using a bulb syringe (for removing boogers from baby's noses) to flush through warm water and vinegar (3-4:1 ratio). An alternative, though more expensive, is an elephant ear washer wax removal kit. Do not use Q-tips as this can impact wax further. ? ?Let us know if you need anything. ?

## 2022-04-20 ENCOUNTER — Other Ambulatory Visit: Payer: Self-pay | Admitting: Family Medicine

## 2022-04-20 ENCOUNTER — Other Ambulatory Visit: Payer: Medicaid Other

## 2022-04-20 DIAGNOSIS — E611 Iron deficiency: Secondary | ICD-10-CM

## 2022-04-20 LAB — LIPID PANEL
Cholesterol: 172 mg/dL (ref 0–200)
HDL: 57.5 mg/dL (ref >39.00)
LDL Cholesterol: 83 mg/dL (ref 0–99)
NonHDL: 114.18
Total CHOL/HDL Ratio: 3
Triglycerides: 158 mg/dL — ABNORMAL HIGH (ref 0.0–149.0)
VLDL: 31.6 mg/dL (ref 0.0–40.0)

## 2022-04-20 LAB — COMPREHENSIVE METABOLIC PANEL
ALT: 13 U/L (ref 0–35)
AST: 15 U/L (ref 0–37)
Albumin: 4.4 g/dL (ref 3.5–5.2)
Alkaline Phosphatase: 42 U/L (ref 39–117)
BUN: 15 mg/dL (ref 6–23)
CO2: 25 mEq/L (ref 19–32)
Calcium: 9.3 mg/dL (ref 8.4–10.5)
Chloride: 106 mEq/L (ref 96–112)
Creatinine, Ser: 0.64 mg/dL (ref 0.40–1.20)
GFR: 120.52 mL/min (ref >60.00)
Glucose, Bld: 84 mg/dL (ref 70–99)
Potassium: 4.1 mEq/L (ref 3.5–5.1)
Sodium: 139 mEq/L (ref 135–145)
Total Bilirubin: 0.4 mg/dL (ref 0.2–1.2)
Total Protein: 7.3 g/dL (ref 6.0–8.3)

## 2022-04-20 LAB — CBC
HCT: 36.2 % (ref 36.0–46.0)
Hemoglobin: 12 g/dL (ref 12.0–15.0)
MCHC: 33.1 g/dL (ref 30.0–36.0)
MCV: 83.3 fl (ref 78.0–100.0)
Platelets: 279 10*3/uL (ref 150.0–400.0)
RBC: 4.34 Mil/uL (ref 3.87–5.11)
RDW: 15.7 % — ABNORMAL HIGH (ref 11.5–15.5)
WBC: 6.2 10*3/uL (ref 4.0–10.5)

## 2022-04-21 ENCOUNTER — Other Ambulatory Visit: Payer: Self-pay | Admitting: Family Medicine

## 2022-04-21 DIAGNOSIS — E611 Iron deficiency: Secondary | ICD-10-CM

## 2022-04-21 LAB — IRON,TIBC AND FERRITIN PANEL
%SAT: 7 % (calc) — ABNORMAL LOW (ref 16–45)
Ferritin: 2 ng/mL — ABNORMAL LOW (ref 16–154)
Iron: 42 ug/dL (ref 40–190)
TIBC: 563 mcg/dL (calc) — ABNORMAL HIGH (ref 250–450)

## 2022-04-26 ENCOUNTER — Ambulatory Visit (HOSPITAL_BASED_OUTPATIENT_CLINIC_OR_DEPARTMENT_OTHER)
Admission: RE | Admit: 2022-04-26 | Discharge: 2022-04-26 | Disposition: A | Discharge status: Home / Self Care | Payer: Medicaid Other | Source: Ambulatory Visit | Attending: Internal Medicine | Admitting: Internal Medicine

## 2022-04-26 ENCOUNTER — Encounter: Payer: Self-pay | Admitting: Family Medicine

## 2022-04-26 DIAGNOSIS — C73 Malignant neoplasm of thyroid gland: Secondary | ICD-10-CM | POA: Diagnosis not present

## 2022-05-03 ENCOUNTER — Ambulatory Visit: Payer: Medicaid Other | Admitting: Family Medicine

## 2022-05-03 ENCOUNTER — Encounter: Payer: Self-pay | Admitting: Family Medicine

## 2022-05-03 ENCOUNTER — Ambulatory Visit (INDEPENDENT_AMBULATORY_CARE_PROVIDER_SITE_OTHER): Payer: Medicaid Other | Admitting: Family Medicine

## 2022-05-03 VITALS — BP 130/66 | HR 78 | Temp 98.3°F | Ht 63.0 in | Wt 156.0 lb

## 2022-05-03 DIAGNOSIS — N644 Mastodynia: Secondary | ICD-10-CM | POA: Diagnosis not present

## 2022-05-03 NOTE — Patient Instructions (Signed)
Ultrasound ordered - they will be calling you to schedule

## 2022-05-03 NOTE — Progress Notes (Signed)
   Acute Office Visit  Subjective:     Patient ID: Megan Webb, female    DOB: Apr 29, 1995, 27 y.o.   MRN: [id number]  Chief Complaint  Patient presents with   Breast Pain    HPI   Breast discomfort: Patient presents for evaluation of  breast discomfort. Change was noted 1 month ago. Patient does routinely do self breast exams.  Patient has noted a change on breast exam. Breast cancer risk factors include family hx on mother's side (aunt, 64's).   Age of menarche was 56. Last menstrual period was 2 weeks ago. Patient reports hormonal therapy - birth control for the past 4 months. Patient is G3P3. Age of first live birth was 27 years old. Patient did breast feed. Patient denies nipple discharge. Patient reports history of right breast discomfort and she had an ultrasound which was normal. Patient denies a personal history of breast cancer. Reports right breast feels full, sore/uncomfortable, and more swollen with some nodules to bilateral lower quadrants. No nipple discharge, but areola feels very itchy.  States it reminds her of the feeling of having fullness when breastfeeding and needing to nurse/pump. She has not breastfed in over a year.    ROS All review of systems negative except what is listed in the HPI      Objective:    BP 130/66   Pulse 78   Temp 98.3 F (36.8 C) (Oral)   Ht 5\' 3"  (1.6 m)   Wt 156 lb (70.8 kg)   SpO2 100%   BMI 27.63 kg/m    Physical Exam Vitals reviewed.  Constitutional:      Appearance: Normal appearance.  Chest:     Chest wall: No mass or tenderness.  Breasts:    Right: No nipple discharge, skin change or tenderness.     Left: Normal. No nipple discharge, skin change or tenderness.     Comments: Right breast fibrous tissue throughout  Lymphadenopathy:     Upper Body:     Right upper body: No supraclavicular, axillary or pectoral adenopathy.     Left upper body: No supraclavicular, axillary or pectoral adenopathy.  Skin:     General: Skin is warm and dry.     Findings: No bruising, erythema or rash.  Neurological:     General: No focal deficit present.     Mental Status: She is alert and oriented to person, place, and time. Mental status is at baseline.  Psychiatric:        Mood and Affect: Mood normal.        Behavior: Behavior normal.        Thought Content: Thought content normal.        Judgment: Judgment normal.         No results found for any visits on 05/03/22.      Assessment & Plan:   Problem List Items Addressed This Visit   None Visit Diagnoses     Breast pain in female    -  Primary No alarm findings on exam. Given duration of symptoms and family history. Ultrasound ordered.  Patient aware of signs/symptoms requiring further/urgent evaluation.     Relevant Orders   US BREAST COMPLETE UNI RIGHT INC AXILLA       No orders of the defined types were placed in this encounter.   Return if symptoms worsen or fail to improve.  Clayborne Dana, NP

## 2022-05-15 ENCOUNTER — Ambulatory Visit
Admission: EM | Admit: 2022-05-15 | Discharge: 2022-05-15 | Disposition: A | Discharge status: Home / Self Care | Payer: Medicaid Other | Attending: Internal Medicine | Admitting: Internal Medicine

## 2022-05-15 DIAGNOSIS — J020 Streptococcal pharyngitis: Secondary | ICD-10-CM

## 2022-05-15 LAB — POCT RAPID STREP A (OFFICE): Rapid Strep A Screen: POSITIVE — AB

## 2022-05-15 MED ORDER — AMOXICILLIN 500 MG PO CAPS: 500.0000 mg | ORAL_CAPSULE | Freq: Two times a day (BID) | ORAL | 0 refills | Status: AC

## 2022-05-15 NOTE — ED Provider Notes (Signed)
[facility]    CSN: [id number] Arrival date & time: 05/15/22  4098      History   Chief Complaint Chief Complaint  Patient presents with   Sore Throat         HPI Megan Webb is a 27 y.o. female comes to the urgent care with severe sore throat which started yesterday.  Patient denies any runny nose or nasal congestion.  She has pain on swallowing.  Pain radiates to the right ear.  No nausea, vomiting or diarrhea.  No rash.  Patient's daughter was recently diagnosed with strep throat.Marland Kitchen   HPI  Past Medical History:  Diagnosis Date   Anemia    on Iron supplements   Anxiety    in the past; not current   Cancer    Cyst of right kidney    Depression    in the past; not current   Headache    has migraines, takes Extra Strength Tylenol with minimal relief   Hypertension    Hyperthyroidism    nodule on thyroid; but not diagnosed with hyperthyroidism   Pre-diabetes    Umbilical hernia 2020    Patient Active Problem List   Diagnosis Date Noted   Papillary thyroid carcinoma 05/01/2021   Pre-eclampsia, mild 12/15/2020   Migraine without aura and without status migrainosus, not intractable 04/14/2019   Anemia 03/12/2018   Umbilical hernia 12/21/2017   H/O pre-eclampsia in prior pregnancy, currently pregnant 08/19/2017   Thyroid nodule 08/19/2017   Cannabis abuse 08/16/2011   History of suicide attempt 08/15/2011   Anxiety 08/15/2011   Benzodiazepine (tranquilizer) overdose 08/14/2011    Past Surgical History:  Procedure Laterality Date   BUNIONECTOMY     LYMPH NODE DISSECTION N/A 05/08/2021   Procedure: LIMITED LYMPH NODE DISSECTION;  Surgeon: Darnell Level, MD;  Location: [facility];  Service: General;  Laterality: N/A;   THYROIDECTOMY N/A 05/08/2021   Procedure: THYROIDECTOMY;  Surgeon: Darnell Level, MD;  Location: [facility];  Service: General;  Laterality: N/A;    OB History     Gravida  3   Para  3   Term  3   Preterm  0   AB  0    Living  3      SAB  0   IAB  0   Ectopic  0   Multiple  0   Live Births  3            Home Medications    Prior to Admission medications   Medication Sig Start Date End Date Taking? Authorizing Provider  amoxicillin (AMOXIL) 500 MG capsule Take 1 capsule (500 mg total) by mouth 2 (two) times daily for 10 days. 05/15/22 05/25/22 Yes Randell Detter, Britta Mccreedy, MD  drospirenone-ethinyl estradiol (YASMIN 28) 3-0.03 MG tablet Take 1 tablet by mouth daily. 01/21/22   Levie Heritage, DO  ferrous sulfate 325 (65 FE) MG tablet Take 325 mg by mouth daily with breakfast.    [provider]  levothyroxine (SYNTHROID) 150 MCG tablet Take 1 tablet (150 mcg total) by mouth daily. 04/06/22   Shamleffer, Konrad Dolores, MD    Family History Family History  Problem Relation Age of Onset   Mental illness Mother        anxiety and depression   Hypertension Mother    Kidney disease Mother    Anemia Mother    Mental illness Father    Cataracts Father    Asthma Brother  Diabetes Maternal Aunt    Cancer Maternal Aunt        breast   Diabetes Maternal Uncle    Arthritis Maternal Grandmother    Stroke Maternal Grandmother    Hypertension Maternal Grandmother    Arthritis Maternal Grandfather    Hypertension Maternal Grandfather    Stroke Maternal Grandfather    Arthritis Paternal Grandfather    Stroke Paternal Grandfather     Social History Social History   Tobacco Use   Smoking status: Never   Smokeless tobacco: Never  Vaping Use   Vaping Use: Never used  Substance Use Topics   Alcohol use: Yes    Comment: occ   Drug use: Not Currently    Frequency: 1.0 times per week    Types: Marijuana    Comment: not while preg     Allergies   Patient has no known allergies.   Review of Systems Review of Systems  HENT:  Positive for sore throat.   Respiratory: Negative.    Cardiovascular: Negative.   Gastrointestinal: Negative.      Physical Exam Triage Vital  Signs ED Triage Vitals  Enc Vitals Group     BP 05/15/22 1011 (!) 149/84     Pulse Rate 05/15/22 1011 82     Resp 05/15/22 1011 16     Temp 05/15/22 1011 99.2 F (37.3 C)     Temp Source 05/15/22 1011 Oral     SpO2 05/15/22 1011 98 %     Weight --      Height --      Head Circumference --      Peak Flow --      Pain Score 05/15/22 1049 0     Pain Loc --      Pain Edu? --      Excl. in GC? --    No data found.  Updated Vital Signs BP (!) 149/84 (BP Location: Left Arm)   Pulse 82   Temp 99.2 F (37.3 C) (Oral)   Resp 16   LMP 05/14/2022 (Exact Date)   SpO2 98%   Visual Acuity Right Eye Distance:   Left Eye Distance:   Bilateral Distance:    Right Eye Near:   Left Eye Near:    Bilateral Near:     Physical Exam Vitals and nursing note reviewed.  Constitutional:      General: She is not in acute distress.    Appearance: She is well-developed. She is not ill-appearing.  HENT:     Right Ear: Tympanic membrane normal.     Left Ear: Tympanic membrane normal.     Mouth/Throat:     Mouth: Mucous membranes are moist.     Pharynx: Posterior oropharyngeal erythema present. No pharyngeal swelling.     Tonsils: Tonsillar exudate present. 1+ on the right. 1+ on the left.  Cardiovascular:     Rate and Rhythm: Normal rate and regular rhythm.  Neurological:     Mental Status: She is alert.      UC Treatments / Results  Labs (all labs ordered are listed, but only abnormal results are displayed) Labs Reviewed  POCT RAPID STREP A (OFFICE) - Abnormal; Notable for the following components:      Result Value   Rapid Strep A Screen Positive (*)    All other components within normal limits    EKG   Radiology No results found.  Procedures Procedures (including critical care time)  Medications Ordered in UC Medications -  No data to display  Initial Impression / Assessment and Plan / UC Course  I have reviewed the triage vital signs and the nursing  notes.  Pertinent labs & imaging results that were available during my care of the patient were reviewed by me and considered in my medical decision making (see chart for details).     1.  Streptococcal pharyngitis: Point-of-care strep is positive Amoxicillin 500 mg twice daily for 10 days Tylenol as needed for pain and/or fever Return precautions given Final Clinical Impressions(s) / UC Diagnoses   Final diagnoses:  Streptococcal pharyngitis     Discharge Instructions      Warm salt water gargle Please take antibiotics as directed Please complete the course of antibiotics Tylenol or ibuprofen as needed for pain and/or fever If you have worsening symptoms please return to urgent care to be reevaluated.   ED Prescriptions     Medication Sig Dispense Auth. Provider   amoxicillin (AMOXIL) 500 MG capsule Take 1 capsule (500 mg total) by mouth 2 (two) times daily for 10 days. 20 capsule Babetta Paterson, Britta Mccreedy, MD      PDMP not reviewed this encounter.   Merrilee Jansky, MD 05/15/22 1106

## 2022-05-15 NOTE — Discharge Instructions (Addendum)
Warm salt water gargle Please take antibiotics as directed Please complete the course of antibiotics Tylenol or ibuprofen as needed for pain and/or fever If you have worsening symptoms please return to urgent care to be reevaluated.

## 2022-05-15 NOTE — ED Triage Notes (Signed)
Pt presents with c/o sore throat since last night. Pt states her daughter recently had strep.

## 2022-05-17 ENCOUNTER — Other Ambulatory Visit (INDEPENDENT_AMBULATORY_CARE_PROVIDER_SITE_OTHER): Payer: Medicaid Other

## 2022-05-17 DIAGNOSIS — E611 Iron deficiency: Secondary | ICD-10-CM

## 2022-05-17 LAB — CBC
HCT: 36 % (ref 36.0–46.0)
Hemoglobin: 11.8 g/dL — ABNORMAL LOW (ref 12.0–15.0)
MCHC: 32.9 g/dL (ref 30.0–36.0)
MCV: 84.6 fl (ref 78.0–100.0)
Platelets: 235 10*3/uL (ref 150.0–400.0)
RBC: 4.26 Mil/uL (ref 3.87–5.11)
RDW: 16 % — ABNORMAL HIGH (ref 11.5–15.5)
WBC: 3.8 10*3/uL — ABNORMAL LOW (ref 4.0–10.5)

## 2022-05-18 LAB — IRON,TIBC AND FERRITIN PANEL
%SAT: 5 % (calc) — ABNORMAL LOW (ref 16–45)
Ferritin: 10 ng/mL — ABNORMAL LOW (ref 16–154)
Iron: 21 ug/dL — ABNORMAL LOW (ref 40–190)
TIBC: 426 mcg/dL (calc) (ref 250–450)

## 2022-05-19 ENCOUNTER — Encounter: Payer: Self-pay | Admitting: Family Medicine

## 2022-05-19 DIAGNOSIS — D509 Iron deficiency anemia, unspecified: Secondary | ICD-10-CM

## 2022-05-28 ENCOUNTER — Telehealth: Payer: Medicaid Other | Admitting: Nurse Practitioner

## 2022-05-28 DIAGNOSIS — N760 Acute vaginitis: Secondary | ICD-10-CM | POA: Diagnosis not present

## 2022-05-28 MED ORDER — FLUCONAZOLE 150 MG PO TABS
ORAL_TABLET | ORAL | 0 refills | Status: DC
Start: 2022-05-28 — End: 2022-07-30

## 2022-05-28 NOTE — Progress Notes (Signed)

## 2022-05-31 ENCOUNTER — Inpatient Hospital Stay: Payer: Medicaid Other | Attending: Hematology & Oncology

## 2022-05-31 ENCOUNTER — Inpatient Hospital Stay: Payer: Medicaid Other | Admitting: Family

## 2022-06-02 ENCOUNTER — Ambulatory Visit
Admission: RE | Admit: 2022-06-02 | Discharge: 2022-06-02 | Disposition: A | Discharge status: Home / Self Care | Payer: Medicaid Other | Source: Ambulatory Visit | Attending: Family Medicine | Admitting: Family Medicine

## 2022-06-02 DIAGNOSIS — N644 Mastodynia: Secondary | ICD-10-CM

## 2022-07-30 ENCOUNTER — Telehealth: Payer: Medicaid Other | Admitting: Nurse Practitioner

## 2022-07-30 DIAGNOSIS — B3731 Acute candidiasis of vulva and vagina: Secondary | ICD-10-CM

## 2022-07-30 MED ORDER — CLOTRIMAZOLE 1 % VA CREA: 1.0000 | TOPICAL_CREAM | Freq: Every day | VAGINAL | 0 refills | Status: DC

## 2022-07-30 MED ORDER — FLUCONAZOLE 150 MG PO TABS
ORAL_TABLET | ORAL | 0 refills | Status: DC
Start: 2022-07-30 — End: 2022-09-23

## 2022-07-30 NOTE — Progress Notes (Signed)
E-Visit for Vaginal Symptoms  Megan Webb- This appears to be a chronic problem, we advise follow up with your OBGYN or primary care to discuss recurrent need for treatment after your period. We will provide management today given this is a holiday weekend. The next time you have symptoms we will recommend an in person visit. Thank you for understanding this is in an effort to get you the best treatment plan possible.   We are sorry that you are not feeling well. Here is how we plan to help! Based on what you shared with me it looks like you: May have a yeast vaginosis  Your treatment plan is :  Meds ordered this encounter  Medications   clotrimazole (GYNE-LOTRIMIN) 1 % vaginal cream    Sig: Place 1 Applicatorful vaginally at bedtime.    Dispense:  45 g    Refill:  0   fluconazole (DIFLUCAN) 150 MG tablet    Sig: Take one tablet on days 1,4 and 7. (Every 72 hours x3)    Dispense:  3 tablet    Refill:  0     Vaginosis is an inflammation of the vagina that can result in discharge, itching and pain. The cause is usually a change in the normal balance of vaginal bacteria or an infection. Vaginosis can also result from reduced estrogen levels after menopause.  The most common causes of vaginosis are:   Bacterial vaginosis which results from an overgrowth of one on several organisms that are normally present in your vagina.   Yeast infections which are caused by a naturally occurring fungus called candida.   Vaginal atrophy (atrophic vaginosis) which results from the thinning of the vagina from reduced estrogen levels after menopause.   Trichomoniasis which is caused by a parasite and is commonly transmitted by sexual intercourse.  Factors that increase your risk of developing vaginosis include: Medications, such as antibiotics and steroids Uncontrolled diabetes Use of hygiene products such as bubble bath, vaginal spray or vaginal deodorant Douching Wearing damp or tight-fitting  clothing Using an intrauterine device (IUD) for birth control Hormonal changes, such as those associated with pregnancy, birth control pills or menopause Sexual activity Having a sexually transmitted infection   Be sure to take all of the medication as directed. Stop taking any medication if you develop a rash, tongue swelling or shortness of breath. Mothers who are breast feeding should consider pumping and discarding their breast milk while on these antibiotics. However, there is no consensus that infant exposure at these doses would be harmful.  Remember that medication creams can weaken latex condoms. Marland Kitchen   HOME CARE:  Good hygiene may prevent some types of vaginosis from recurring and may relieve some symptoms:  Avoid baths, hot tubs and whirlpool spas. Rinse soap from your outer genital area after a shower, and dry the area well to prevent irritation. Don't use scented or harsh soaps, such as those with deodorant or antibacterial action. Avoid irritants. These include scented tampons and pads. Wipe from front to back after using the toilet. Doing so avoids spreading fecal bacteria to your vagina.  Other things that may help prevent vaginosis include:  Don't douche. Your vagina doesn't require cleansing other than normal bathing. Repetitive douching disrupts the normal organisms that reside in the vagina and can actually increase your risk of vaginal infection. Douching won't clear up a vaginal infection. Use a latex condom. Both female and female latex condoms may help you avoid infections spread by sexual contact. Wear cotton  underwear. Also wear pantyhose with a cotton crotch. If you feel comfortable without it, skip wearing underwear to bed. Yeast thrives in [employer] Your symptoms should improve in the next day or two.  GET HELP RIGHT AWAY IF:  You have pain in your lower abdomen ( pelvic area or over your ovaries) You develop nausea or vomiting You develop a fever Your  discharge changes or worsens You have persistent pain with intercourse You develop shortness of breath, a rapid pulse, or you faint.  These symptoms could be signs of problems or infections that need to be evaluated by a medical provider now.  MAKE SURE YOU   Understand these instructions. Will watch your condition. Will get help right away if you are not doing well or get worse.  Thank you for choosing an e-visit.  Your e-visit answers were reviewed by a board certified advanced clinical practitioner to complete your personal care plan. Depending upon the condition, your plan could have included both over the counter or prescription medications.  Please review your pharmacy choice. Make sure the pharmacy is open so you can pick up prescription now. If there is a problem, you may contact your provider through [employer] and have the prescription routed to another pharmacy.  Your safety is important to [place]. If you have drug allergies check your prescription carefully.   For the next 24 hours you can use MyChart to ask questions about today's visit, request a non-urgent call back, or ask for a work or school excuse. You will get an email in the next two days asking about your experience. I hope that your e-visit has been valuable and will speed your recovery.   I spent approximately 5 minutes reviewing the patient's history, current symptoms and coordinating their care today.

## 2022-09-23 ENCOUNTER — Other Ambulatory Visit: Payer: Self-pay

## 2022-09-23 ENCOUNTER — Encounter: Payer: Self-pay | Admitting: Family Medicine

## 2022-09-23 DIAGNOSIS — B3731 Acute candidiasis of vulva and vagina: Secondary | ICD-10-CM

## 2022-09-23 MED ORDER — FLUCONAZOLE 150 MG PO TABS
ORAL_TABLET | ORAL | 0 refills | Status: DC
Start: 2022-09-23 — End: 2023-05-05

## 2022-10-11 ENCOUNTER — Ambulatory Visit: Payer: Medicaid Other | Admitting: Internal Medicine

## 2022-11-06 IMAGING — DX DG CHEST 2V
2 series · 2 of 2 positions shown · non-contrast
Comparison: CTA chest 04/17/2021

CLINICAL DATA: Thyroidectomy planned for 05/08/2021, preoperative
chest.

EXAM:
CHEST - 2 VIEW

[chest pa]
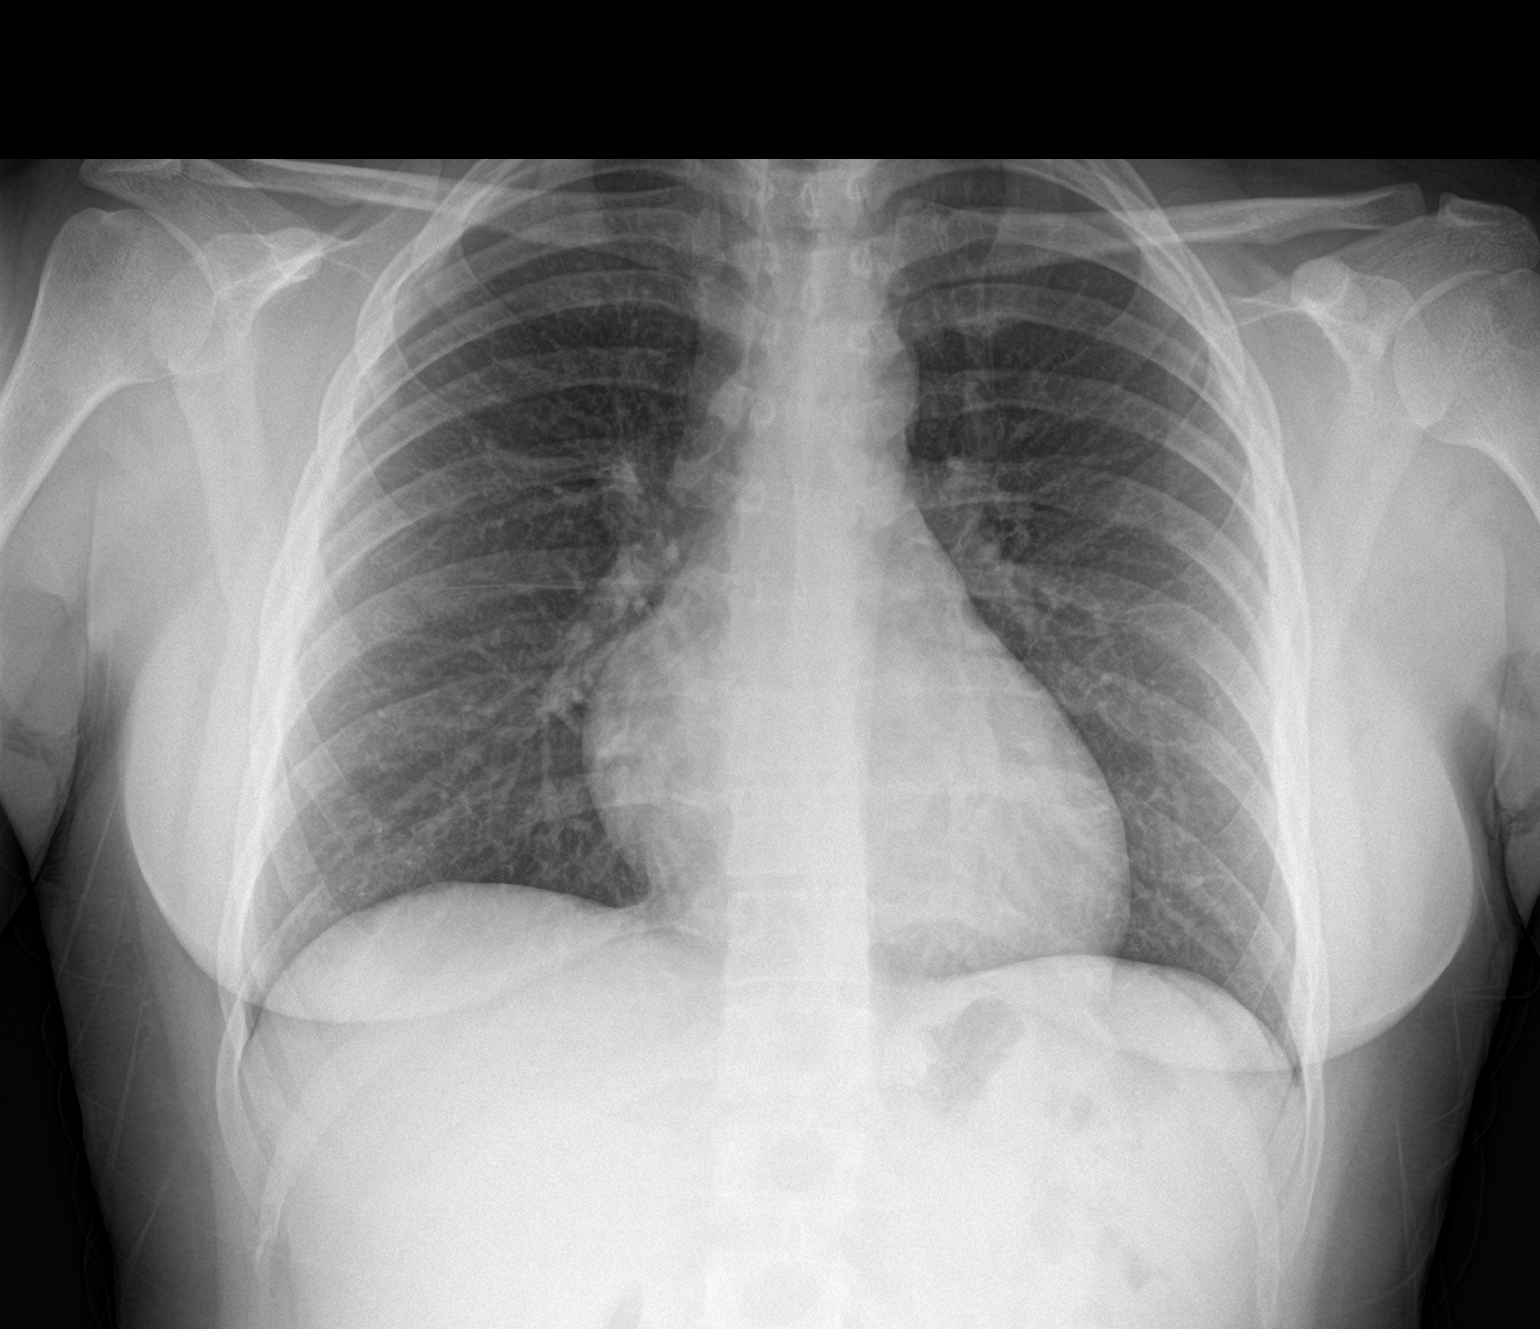

[chest lat]
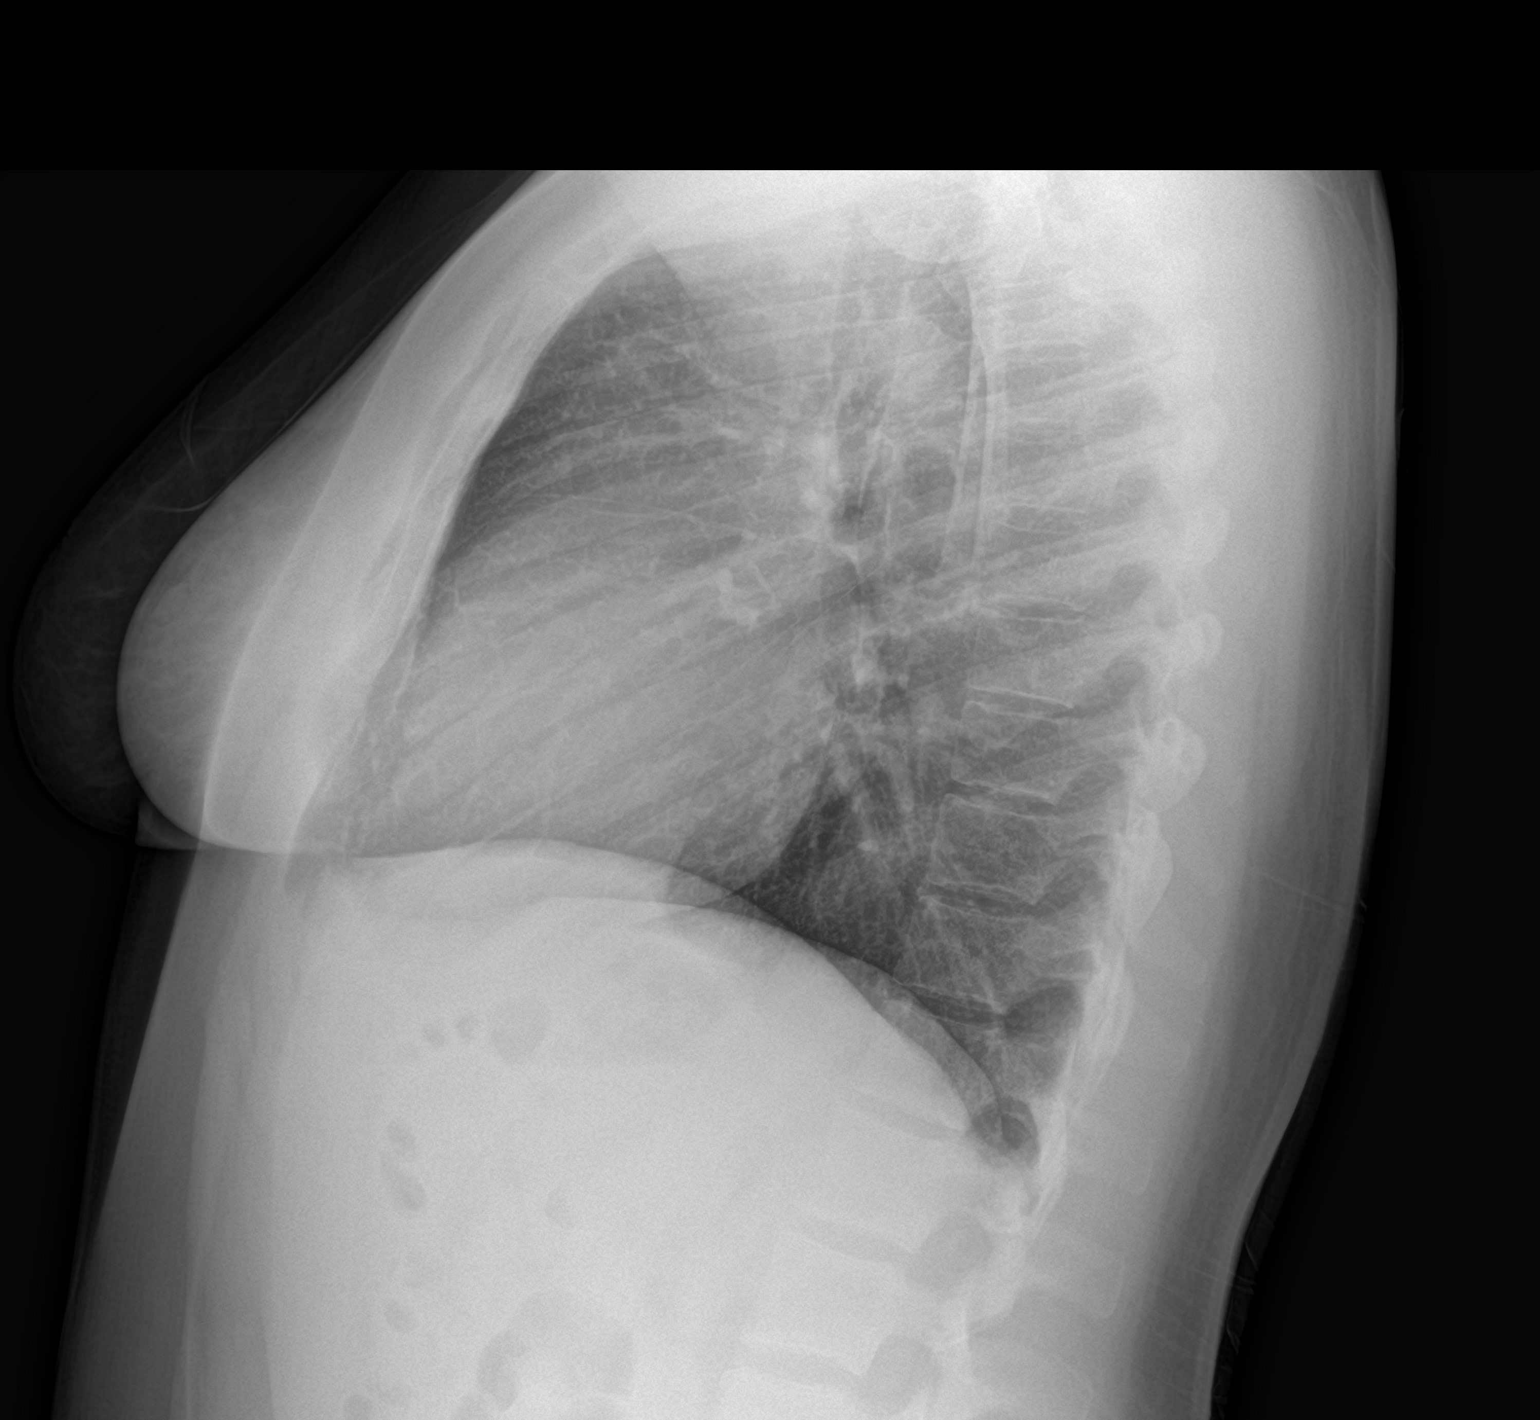

[2 of 2 positions shown; findings below may reference images not displayed]

FINDINGS: The heart size and mediastinal contours are within normal limits.
Both lungs are clear. The visualized skeletal structures are
unremarkable.
IMPRESSION: No active cardiopulmonary disease.

## 2022-12-20 ENCOUNTER — Other Ambulatory Visit: Payer: Self-pay

## 2022-12-20 ENCOUNTER — Encounter: Payer: Self-pay | Admitting: Family Medicine

## 2022-12-20 DIAGNOSIS — B379 Candidiasis, unspecified: Secondary | ICD-10-CM

## 2022-12-20 MED ORDER — FLUCONAZOLE 150 MG PO TABS
ORAL_TABLET | ORAL | 1 refills | Status: DC
Start: 2022-12-20 — End: 2023-05-05

## 2022-12-20 NOTE — Progress Notes (Signed)
Patient sent Mychart message requesing a Rx for yeast. Patient states she is itching and having some clumpy cottage cheese discharge. Diflucan 150 mg one tablet PO was sent to her pharmacy. Amen Dargis l Hser Belanger, CMA

## 2022-12-22 ENCOUNTER — Encounter: Payer: Self-pay | Admitting: Family Medicine

## 2022-12-22 ENCOUNTER — Other Ambulatory Visit: Payer: Self-pay

## 2022-12-22 DIAGNOSIS — Z3040 Encounter for surveillance of contraceptives, unspecified: Secondary | ICD-10-CM

## 2022-12-22 MED ORDER — DROSPIRENONE-ETHINYL ESTRADIOL 3-0.03 MG PO TABS
1.0000 | ORAL_TABLET | Freq: Every day | ORAL | 1 refills | Status: DC
Start: 2022-12-22 — End: 2023-05-03

## 2022-12-27 ENCOUNTER — Ambulatory Visit: Payer: Medicaid Other | Admitting: Internal Medicine

## 2022-12-27 ENCOUNTER — Encounter: Payer: Self-pay | Admitting: Internal Medicine

## 2022-12-27 VITALS — BP 120/80 | HR 82 | Ht 63.0 in | Wt 167.0 lb

## 2022-12-27 DIAGNOSIS — Z85850 Personal history of malignant neoplasm of thyroid: Secondary | ICD-10-CM | POA: Diagnosis not present

## 2022-12-27 DIAGNOSIS — E890 Postprocedural hypothyroidism: Secondary | ICD-10-CM | POA: Insufficient documentation

## 2022-12-27 NOTE — Progress Notes (Unsigned)
Name: Megan Webb  MRN/ DOB: [id number], 1995-06-19    Age/ Sex: 27 y.o., female    PCP: Sharlene Dory, DO   Reason for Endocrinology Evaluation: Thyroid nodule      Date of Initial Endocrinology Evaluation: 10/15/2020    HPI: [name] is a 27 y.o. female with a past medical history of Thyroid nodule . The patient presented for initial endocrinology clinic visit on 10/15/2020 for consultative assistance with her Thyroid nodule .      HISTORICAL SUMMARY:   n review of her records she has been diagnosed with left thyroid nodule 2.2 cm in 2018 and an FNA was recommended. I am unable to find records of FNA of the thyroid nodule but per pt she already had an FNA  through [facility] .  In 2021 repeat ultrasound confirmed left thyroid nodule and FNA was again recommended. 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  No extracapsular invasion  04/05/2022: Tg 0.1 ( Ab 1) 07/03/2021: Tg 0.2 (Ab 2)     No family history of thyroid disease  SUBJECTIVE:     Today (12/27/22):  [name] is here for a follow up on postoperative hypothyroidism and PTC  Weight has been fluctuating  No local swelling  Has  occasional constipation but no diarrhea  Continues with  occasional palpitations mainly at night  Has occasional tremors  She is COC, has noted heavy menstruations , has heat intolerance  Has hair loss    Levothyroxine 150  mcg daily    HISTORY:  Past Medical History:  Past Medical History:  Diagnosis Date   Anemia    on Iron supplements   Anxiety    in the past; not current   Cancer (HCC)    Cyst of right kidney    Depression    in the past; not current   Headache    has migraines, takes Extra Strength Tylenol with minimal relief   Hypertension    Hyperthyroidism    nodule on thyroid; but not diagnosed with hyperthyroidism   Pre-diabetes    Umbilical hernia 2020   Past Surgical History:  Past Surgical History:  Procedure Laterality Date   BUNIONECTOMY     LYMPH NODE DISSECTION N/A 05/08/2021   Procedure: LIMITED LYMPH NODE DISSECTION;  Surgeon: Darnell Level, MD;  Location: [facility];  Service: General;  Laterality: N/A;   THYROIDECTOMY N/A 05/08/2021   Procedure: THYROIDECTOMY;  Surgeon: Darnell Level, MD;  Location: [facility];  Service: General;  Laterality: N/A;    Social History:  reports that she has never smoked. She has never used smokeless tobacco. She reports current alcohol use. She reports that she does not currently use drugs after having used the following drugs: Marijuana. Frequency: 1.00 time per week. Family History: family history includes Anemia in her mother; Arthritis in her maternal grandfather, maternal grandmother, and paternal grandfather; Asthma  in her brother; Cancer in her maternal aunt; Cataracts in her father; Diabetes in her maternal aunt and maternal uncle; Hypertension in her maternal grandfather, maternal grandmother, and mother; Kidney disease in her mother; Mental illness in her father and mother; Stroke in her maternal grandfather, maternal grandmother, and paternal grandfather.   HOME MEDICATIONS: Allergies as of 12/27/2022   No Known Allergies      Medication List        Accurate as of December 27, 2022 12:20 PM. If you have any questions, ask your nurse or doctor.          clotrimazole 1 % vaginal cream Commonly known as: GYNE-LOTRIMIN Place 1  Applicatorful vaginally at bedtime.   drospirenone-ethinyl estradiol 3-0.03 MG tablet Commonly known as: Yasmin 28 Take 1 tablet by mouth daily.   ferrous sulfate 325 (65 FE) MG tablet Take 325 mg by mouth daily with breakfast.   fluconazole 150 MG tablet Commonly known as: DIFLUCAN Take one tablet on days 1,4 and 7. (Every 72 hours x3)   fluconazole 150 MG tablet Commonly known as: DIFLUCAN Take 1 tablet by mouth.Repeat in 3 days if symptoms persists.   levothyroxine 150 MCG tablet Commonly known as: SYNTHROID Take 1 tablet (150 mcg total) by mouth daily.          REVIEW OF SYSTEMS: A comprehensive ROS was conducted with the patient and is negative except as per HPI    OBJECTIVE:  VS: BP 120/80 (BP Location: Right Arm, Patient Position: Sitting, Cuff Size: Small)   Pulse 82   Ht 5\' 3"  (1.6 m)   Wt 167 lb (75.8 kg)   SpO2 97%   BMI 29.58 kg/m    Wt Readings from Last 3 Encounters:  12/27/22 167 lb (75.8 kg)  05/03/22 156 lb (70.8 kg)  04/19/22 157 lb 2 oz (71.3 kg)     EXAM: General: Pt appears well and is in NAD  Neck: General: Supple without adenopathy. Thyroid: NO nodule appreciated.  Lungs: Clear with good BS bilat with no rales, rhonchi, or wheezes  Heart: Auscultation: RRR.  Abdomen: Normoactive bowel sounds, soft, nontender, without masses or organomegaly palpable  Extremities:  BL LE: No pretibial edema normal ROM and strength.  Mental Status: Judgment, insight: Intact Orientation: Oriented to time, place, and person Mood and affect: No depression, anxiety, or agitation     DATA REVIEWED:   Latest Reference Range & Units 12/27/22 13:02  TSH mIU/L 0.64        Pathology report 05/08/2021   SURGICAL PATHOLOGY  CASE: [id number]  PATIENT: Megan Webb  Surgical Pathology Report      Clinical History: Papillary thyroid carcinoma (crm)      FINAL MICROSCOPIC DIAGNOSIS:   A. THYROID, TOTAL THYROIDECTOMY (26 g):   Papillary carcinoma, classic subtype, involving the left lobe  Tumor limited to thyroid and measures 2.2 x 2.1 x 1.6 cm (pT2)  Background chronic thyroiditis   B. LYMPH NODE, CENTRAL COMPARTMENT, LEVEL VI, RESECTION:  Eleven benign reactive lymph nodes, negative for carcinoma (0/11, pN0)   ONCOLOGY TABLE:   THYROID GLAND, CARCINOMA: Resection   Procedure: Total thyroidectomy  Tumor Focality: Unifocal  Tumor Site: Left lobe  Tumor Size: 2.2 x 2.1 x 1.6 cm  Histologic Type: Papillary carcinoma, classic subtype  Angioinvasion: Not identified  Lymphatic Invasion: Not identified  Extrathyroidal Extension: Not identified  Margin Status: All margins negative for invasive carcinoma  Regional Lymph Node Status:       Number  of Lymph Nodes with Tumor: 0       Number of Lymph Nodes Examined: 11       Nodal Level(s) Examined: Level VI  Distant Metastasis: Not applicable  Pathologic Stage Classification (pTNM, AJCC 8th Edition): pT2, pN0  Ancillary Studies: Available upon request  Representative Tumor Block: A2  Comment(s): None     Thyroid bed ultrasound 04/26/2022   Post total thyroidectomy without residual or recurrent thyroid tissue within the isthmic or LEFT thyroidectomy bed.   Homogeneously hyperechoic soft tissue within the RIGHT thyroid bed, with vascular flow on Doppler, measuring up to 0.9 x 0.8 x 0.6 cm.   No cervical adenopathy or abnormal fluid collection within the imaged neck.   IMPRESSION: 1. 0.9 cm hypoechoic soft tissue within the RIGHT thyroid bed. Findings suspicious for residual thyroid tissue. See key image 2. No residual or recurrent thyroid tissue within the isthmic of or LEFT thyroidectomy bed.  ASSESSMENT/PLAN/RECOMMENDATIONS:   PTC :Stage I   - S/P total thyroidectomy 04/2021 with 2.2 cm PTC  -Patient is at low risk for recurrence -Patient with excellent biochemical response -Tg, Tg AB pending today, but historically this has been  undetectable -Will proceed with thyroid bed ultrasound  -TSH goal 0.5-2.0 u IU/mL -TSH at goal, no change   2.  Postoperative Hypothyroidism:  -Patient with multiple non-specific symptoms - TSh at  goal , no change   Medication Levothyroxine 150 mcg daily    Follow-up in 6 months  Signed electronically by: Lyndle Herrlich, MD  [facility]  [facility] [address] [place], [place] [address] Phone: [phone] FAX: [phone]   CC: Sharlene Dory, DO [address] [place] [address] Phone: [phone] Fax: [phone]   Return to Endocrinology clinic as below: No future appointments.

## 2022-12-27 NOTE — Patient Instructions (Addendum)
Hold Biotin 2-3 days before any future thyroid blood work

## 2022-12-28 MED ORDER — LEVOTHYROXINE SODIUM 150 MCG PO TABS: 150.0000 ug | ORAL_TABLET | Freq: Every day | ORAL | 3 refills | Status: DC

## 2022-12-29 LAB — THYROGLOBULIN LEVEL: Thyroglobulin: <0.1 ng/mL — ABNORMAL LOW

## 2022-12-29 LAB — TSH: TSH: 0.64 m[IU]/L

## 2022-12-29 LAB — THYROGLOBULIN ANTIBODY: Thyroglobulin Ab: 2 [IU]/mL — ABNORMAL HIGH (ref <=1)

## 2022-12-30 ENCOUNTER — Ambulatory Visit
Admission: RE | Admit: 2022-12-30 | Discharge: 2022-12-30 | Disposition: A | Discharge status: Home / Self Care | Payer: Medicaid Other | Source: Ambulatory Visit | Attending: Internal Medicine | Admitting: Internal Medicine

## 2022-12-30 ENCOUNTER — Other Ambulatory Visit: Payer: Medicaid Other

## 2022-12-30 DIAGNOSIS — E890 Postprocedural hypothyroidism: Secondary | ICD-10-CM

## 2023-05-03 ENCOUNTER — Other Ambulatory Visit (HOSPITAL_COMMUNITY)
Admission: RE | Admit: 2023-05-03 | Discharge: 2023-05-03 | Disposition: A | Discharge status: Home / Self Care | Source: Ambulatory Visit

## 2023-05-03 ENCOUNTER — Ambulatory Visit (INDEPENDENT_AMBULATORY_CARE_PROVIDER_SITE_OTHER)

## 2023-05-03 VITALS — BP 128/78 | HR 60 | Ht 63.0 in | Wt 170.0 lb

## 2023-05-03 DIAGNOSIS — Z01419 Encounter for gynecological examination (general) (routine) without abnormal findings: Secondary | ICD-10-CM | POA: Insufficient documentation

## 2023-05-03 DIAGNOSIS — N898 Other specified noninflammatory disorders of vagina: Secondary | ICD-10-CM

## 2023-05-03 DIAGNOSIS — R3915 Urgency of urination: Secondary | ICD-10-CM | POA: Diagnosis not present

## 2023-05-03 DIAGNOSIS — Z1239 Encounter for other screening for malignant neoplasm of breast: Secondary | ICD-10-CM | POA: Diagnosis not present

## 2023-05-03 DIAGNOSIS — Z124 Encounter for screening for malignant neoplasm of cervix: Secondary | ICD-10-CM

## 2023-05-03 DIAGNOSIS — N393 Stress incontinence (female) (male): Secondary | ICD-10-CM | POA: Diagnosis not present

## 2023-05-03 DIAGNOSIS — Z3040 Encounter for surveillance of contraceptives, unspecified: Secondary | ICD-10-CM

## 2023-05-03 DIAGNOSIS — Z113 Encounter for screening for infections with a predominantly sexual mode of transmission: Secondary | ICD-10-CM

## 2023-05-03 LAB — POCT URINALYSIS DIPSTICK
Bilirubin, UA: NEGATIVE
Glucose, UA: NEGATIVE
Ketones, UA: NEGATIVE
Leukocytes, UA: NEGATIVE
Nitrite, UA: NEGATIVE
Protein, UA: NEGATIVE
Spec Grav, UA: 1.025 (ref 1.010–1.025)
Urobilinogen, UA: 0.2 U/dL
pH, UA: 6 (ref 5.0–8.0)

## 2023-05-03 MED ORDER — DROSPIRENONE-ETHINYL ESTRADIOL 3-0.03 MG PO TABS
1.0000 | ORAL_TABLET | Freq: Every day | ORAL | 3 refills | Status: AC
Start: 2023-05-03 — End: ?

## 2023-05-03 NOTE — Progress Notes (Signed)
 GYNECOLOGY OFFICE VISIT NOTE-WELL WOMAN EXAM  History:   Megan Webb is a 28 year old here today for "pelvic exam."  She reports she thinks she may have a yeast infection d/t itching.  She reports itching started today, but denies discharge.   She denies any abnormal vaginal discharge, bleeding, pelvic pain or other concerns.   Birth Control:  Oral Contraception-Satisfied  Reproductive Concerns Sexually Active: Yes Partners Type: Female; Condom Number of partners in last year: One STD Testing: Full Testing  Obstetrical History: [id number]  Gynecological History: No history of abnormal paps or gyn surgery.  Vaginal/GU Concerns: She also reports some urinary frequency and urgency.  She states she can't hold urine.  Breast Concerns/Exams: She denies breast issues. Reports checking breast daily. Endorses SBA. She reports maternal aunt with breast cancer and maternal cousin with cervical cancer. She denies other family history of breast, uterine, cervical, or ovarian cancer  Medical and Nutrition PCP: None Significant PMx: HTN, Postoperative Hypothyroidism-Takes synthroid 150 mcg daily, Thyroid Carcinoma, Thyroidectomy. Reports she will see oncologist in June Exercise: 3x/ week. Cardio & Strength. 45 minute sessions.  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 Lives with husband and 3 kids.  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   Past Medical History:  Diagnosis Date   Anemia    on Iron supplements   Anxiety    in the past; not current   Cancer [facility])    Cyst of right kidney    Depression    in the past; not current   Headache    has migraines, takes Extra Strength Tylenol with minimal relief   Hypertension    Hyperthyroidism    nodule on thyroid; but not diagnosed with hyperthyroidism   Pre-diabetes     Umbilical hernia 2020    Past Surgical History:  Procedure Laterality Date   BUNIONECTOMY     LYMPH NODE DISSECTION N/A 05/08/2021   Procedure: LIMITED LYMPH NODE DISSECTION;  Surgeon: Darnell Level, MD;  Location: [facility];  Service: General;  Laterality: N/A;   THYROIDECTOMY N/A 05/08/2021   Procedure: THYROIDECTOMY;  Surgeon: Darnell Level, MD;  Location: [facility];  Service: General;  Laterality: N/A;    The following portions of the patient's history were reviewed and updated as appropriate: allergies, current medications, past family history, past medical history, past social history, past surgical history and problem list.   Health Maintenance: Pap: Juen 2021, Normal Results.  Mammogram: N/A.  Colonoscopy: N/A Review of Systems:  Pertinent items noted in HPI and remainder of comprehensive ROS otherwise negative.    Objective:    Physical Exam BP 128/78 (BP Location: Left Arm, Patient Position: Sitting, Cuff Size: Normal)   Pulse 60   Ht 5\' 3"  (1.6 m)   Wt 170 lb (77.1 kg)   LMP 04/29/2023 (Exact Date)   BMI 30.11 kg/m  Physical Exam Vitals reviewed. Exam conducted with a chaperone present [place], [place]).  Constitutional:      Appearance: Normal appearance.  HENT:     Head: Normocephalic and atraumatic.  Eyes:     Conjunctiva/sclera: Conjunctivae normal.  Cardiovascular:     Rate and Rhythm: Normal rate and regular rhythm.     Heart sounds: Normal heart sounds.  Pulmonary:     Effort: Pulmonary effort is normal. No respiratory distress.  Chest:  Breasts:    Right: Normal. No mass, nipple discharge,  skin change or tenderness.     Left: Normal. No mass, nipple discharge, skin change or tenderness.     Comments: CBE completed Abdominal:     General: Bowel sounds are normal.     Palpations: Abdomen is soft.     Tenderness: There is no abdominal tenderness.  Genitourinary:    General: Normal vulva.     Labia:        Right: No tenderness or lesion.        Left: No  tenderness or lesion.      Vagina: Vaginal discharge (Small amt milky whitish yellow d/c noted.) present. No tenderness or bleeding.     Cervix: No cervical motion tenderness, friability, lesion or cervical bleeding.     Uterus: Not enlarged and not tender.      Comments: Pap collected with broom and spatula.  CV collected  Musculoskeletal:        General: Normal range of motion.     Cervical back: Normal range of motion.  Skin:    General: Skin is warm and dry.     Comments: Tattoo noted  Neurological:     Mental Status: She is alert and oriented to person, place, and time.  Psychiatric:        Mood and Affect: Mood normal.        Behavior: Behavior normal.      Labs and Imaging No results found for this or any previous visit (from the past week). No results found.   Assessment & Plan:  28 year old female Well Woman Exam with Pap Breast Exam STD Screen Vaginal Discharge Urinary Incontinence (Stress Type) Urinary Urgency  1. Well woman exam with routine gynecological exam (Primary) -Exam performed and findings discussed. -Encouraged to utilize Mychart for reviewing of results, communication with office, and scheduling of appts. -Encouraged to continue exercise routine.  - Cytology - PAP( [place])  2. Pap smear for cervical cancer screening -Pap collected -Educated on ASCCP guidelines regarding pap smear evaluation and frequency. -Informed of turnover time and provider/clinic policy on releasing results. - Cytology - PAP( [place])  3. Encounter for screening breast examination -CBE completed and normal.  -Educated and encouraged to continue SBE with increased breast awareness including examination of breast for skin changes, moles, tenderness, etc  4. Urinary urgency -UA today -Will send for culture to r/o infection.   5. Stress incontinence of urine -Discussed kegel exercises. Information placed in AVS.  -Reassured that this can be a normal occurrence  after vaginal deliveries.  -Referral placed for pelvic floor therapy.   6. Vaginal discharge -STD screen -Will also test for yeast and BV. -Informed that exam not definitive and will treat accordingly.   7. Screening examination for STD (sexually transmitted disease) -Full testing desired. -Plan to return for lab work at later time.   8. Encounter for refill of prescription for contraception -Refill sent for yasmin.   Routine preventative health maintenance measures emphasized. Please refer to After Visit Summary for other counseling recommendations.   No follow-ups on file.      Cherre Robins, CNM 05/03/2023

## 2023-05-04 LAB — CERVICOVAGINAL ANCILLARY ONLY
Bacterial Vaginitis (gardnerella): NEGATIVE
Candida Glabrata: POSITIVE — AB
Candida Vaginitis: POSITIVE — AB
Chlamydia: NEGATIVE
Comment: NEGATIVE
Comment: NEGATIVE
Comment: NEGATIVE
Comment: NEGATIVE
Comment: NEGATIVE
Comment: NORMAL
Neisseria Gonorrhea: NEGATIVE
Trichomonas: NEGATIVE

## 2023-05-05 ENCOUNTER — Other Ambulatory Visit: Payer: Self-pay

## 2023-05-05 DIAGNOSIS — B379 Candidiasis, unspecified: Secondary | ICD-10-CM

## 2023-05-05 LAB — URINE CULTURE: Organism ID, Bacteria: NO GROWTH

## 2023-05-05 MED ORDER — FLUCONAZOLE 150 MG PO TABS: 150.0000 mg | ORAL_TABLET | ORAL | 1 refills | Status: DC | PRN

## 2023-05-05 MED ORDER — METRONIDAZOLE 500 MG PO TABS: 500.0000 mg | ORAL_TABLET | Freq: Two times a day (BID) | ORAL | 0 refills | Status: DC

## 2023-05-05 NOTE — Progress Notes (Signed)
 Error

## 2023-05-05 NOTE — Addendum Note (Signed)
 Addended by: Gerrit Heck L on: 05/05/2023 08:51 PM   Modules accepted: Orders

## 2023-05-06 LAB — CYTOLOGY - PAP: Diagnosis: NEGATIVE

## 2023-06-13 ENCOUNTER — Other Ambulatory Visit: Payer: Self-pay | Admitting: Internal Medicine

## 2023-06-28 ENCOUNTER — Encounter: Payer: Self-pay | Admitting: Internal Medicine

## 2023-06-28 ENCOUNTER — Ambulatory Visit (INDEPENDENT_AMBULATORY_CARE_PROVIDER_SITE_OTHER): Payer: Medicaid Other | Admitting: Internal Medicine

## 2023-06-28 VITALS — BP 120/70 | HR 73 | Ht 63.0 in | Wt 171.0 lb

## 2023-06-28 DIAGNOSIS — Z85850 Personal history of malignant neoplasm of thyroid: Secondary | ICD-10-CM

## 2023-06-28 DIAGNOSIS — E890 Postprocedural hypothyroidism: Secondary | ICD-10-CM

## 2023-06-28 NOTE — Progress Notes (Unsigned)
 Name: Megan Webb  MRN/ DOB: [id number], 11/18/1995    Age/ Sex: 28 y.o., female    PCP: Jobe Mulder, DO   Reason for Endocrinology Evaluation: Thyroid  nodule      Date of Initial Endocrinology Evaluation: 10/15/2020    HPI: [name] is a 28 y.o. female with a past medical history of Thyroid  nodule . The patient presented for initial endocrinology clinic visit on 10/15/2020 for consultative assistance with her Thyroid  nodule .      HISTORICAL SUMMARY:   n review of her records she has been diagnosed with left thyroid  nodule 2.2 cm in 2018 and an FNA was recommended. I am unable to find records of FNA of the thyroid  nodule but per pt she already had an FNA  through [facility] .  In 2021 repeat ultrasound confirmed left thyroid  nodule and FNA was again recommended. 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  No extracapsular invasion   07/03/2021: Tg 0.2 (Ab 2) 04/05/2022: Tg 0.1 ( Ab 1) 12/27/2022: Tg  <0.1 (Ab 2)   No family history of thyroid  disease  SUBJECTIVE:     Today (06/28/23):  [name] is here for a follow up on postoperative hypothyroidism and PTC  Weight has been fluctuating  No local neck swelling , has noted pain at surgical site  Continues with  occasional palpitations  Continues with  occasional tremors  Has noted recent constipation - took Miralax  She is COC, has noted heavy menstruations ,  has heat intolerance  Has hair loss    Levothyroxine  150 mcg daily    HISTORY:  Past Medical History:  Past Medical History:  Diagnosis Date   Anemia    on Iron supplements   Anxiety    in the past; not current   Cancer (HCC)    Cyst of right kidney    Depression    in the past; not current   Headache    has migraines, takes Extra Strength Tylenol  with minimal relief   Hypertension    Hyperthyroidism    nodule on thyroid ; but not diagnosed with hyperthyroidism   Pre-diabetes    Umbilical hernia 2020   Past Surgical History:  Past Surgical History:  Procedure Laterality Date   BUNIONECTOMY     LYMPH NODE DISSECTION N/A 05/08/2021   Procedure: LIMITED LYMPH NODE DISSECTION;  Surgeon: Oralee Billow, MD;  Location: [facility];  Service: General;  Laterality: N/A;   THYROIDECTOMY N/A 05/08/2021   Procedure: THYROIDECTOMY;  Surgeon: Oralee Billow, MD;  Location: [facility];  Service: General;  Laterality: N/A;    Social History:  reports that she has never smoked. She has never used smokeless tobacco. She reports current alcohol use. She reports that she does not currently use drugs after having used the following drugs: Marijuana. Frequency: 1.00 time per week. Family History: family history includes Anemia in her  mother; Arthritis in her maternal grandfather, maternal grandmother, and paternal grandfather; Asthma in her brother; Cancer in her maternal aunt; Cataracts in her father; Diabetes in her maternal aunt and maternal uncle; Hypertension in her maternal grandfather, maternal grandmother, and mother; Kidney disease in her mother; Mental illness in her father and mother; Stroke in her maternal grandfather, maternal grandmother, and paternal grandfather.   HOME MEDICATIONS: Allergies as of 06/28/2023   No Known Allergies      Medication List        Accurate as of June 28, 2023  1:17 PM. If you have any questions, ask your nurse or doctor.          cholecalciferol 25 MCG (1000  UNIT) tablet Commonly known as: VITAMIN D3 Take 1,000 Units by mouth daily.   clotrimazole  1 % vaginal cream Commonly known as: GYNE-LOTRIMIN  Place 1 Applicatorful vaginally at bedtime.   drospirenone -ethinyl estradiol  3-0.03 MG tablet Commonly known as: Yasmin  28 Take 1 tablet by mouth daily.   ferrous sulfate  325 (65 FE) MG tablet Take 325 mg by mouth daily with breakfast.   fluconazole  150 MG tablet Commonly known as: DIFLUCAN  Take 1 tablet (150 mg total) by mouth as needed. Can take additional dose three days later if symptoms persist   levothyroxine  150 MCG tablet Commonly known as: SYNTHROID  TAKE 1 TABLET(150 MCG) BY MOUTH DAILY   metroNIDAZOLE  500 MG tablet Commonly known as: FLAGYL  Take 1 tablet (500 mg total) by mouth 2 (two) times daily.          REVIEW OF SYSTEMS: A comprehensive ROS was conducted with the patient and is negative except as per HPI    OBJECTIVE:  VS: BP 120/70 (BP Location: Left Arm, Patient Position: Sitting, Cuff Size: Normal)   Pulse 73   Ht 5\' 3"  (1.6 m)   Wt 171 lb (77.6 kg)   SpO2 99%   BMI 30.29 kg/m    Wt Readings from Last 3 Encounters:  06/28/23 171 lb (77.6 kg)  05/03/23 170 lb (77.1 kg)  12/27/22 167 lb (75.8 kg)     EXAM: General: Pt appears well and is in NAD  Neck: General: Supple without adenopathy. Thyroid : NO nodule appreciated.  Lungs: Clear with good BS bilat with no rales, rhonchi, or wheezes  Heart: Auscultation: RRR.  Abdomen: Normoactive bowel sounds, soft, nontender, without masses or organomegaly palpable  Extremities:  BL LE: No pretibial edema normal ROM and strength.  Mental Status: Judgment, insight: Intact Orientation: Oriented to time, place, and person Mood and affect: No depression, anxiety, or agitation     DATA REVIEWED:   Latest Reference Range & Units 12/27/22 13:02  TSH mIU/L 0.64        Pathology report 05/08/2021   SURGICAL PATHOLOGY  CASE: [id number]  PATIENT:  Megan Webb  Surgical Pathology Report      Clinical History: Papillary thyroid  carcinoma (crm)      FINAL MICROSCOPIC DIAGNOSIS:   A. THYROID , TOTAL THYROIDECTOMY (26 g):  Papillary carcinoma, classic subtype, involving the left lobe  Tumor limited to thyroid  and measures 2.2 x 2.1 x 1.6 cm (pT2)  Background chronic thyroiditis   B. LYMPH NODE, CENTRAL COMPARTMENT, LEVEL VI, RESECTION:  Eleven benign reactive lymph nodes, negative for carcinoma (0/11, pN0)   ONCOLOGY TABLE:   THYROID  GLAND, CARCINOMA: Resection   Procedure: Total thyroidectomy  Tumor Focality: Unifocal  Tumor Site: Left lobe  Tumor Size: 2.2 x 2.1 x 1.6 cm  Histologic Type: Papillary carcinoma,  classic subtype  Angioinvasion: Not identified  Lymphatic Invasion: Not identified  Extrathyroidal Extension: Not identified  Margin Status: All margins negative for invasive carcinoma  Regional Lymph Node Status:       Number of Lymph Nodes with Tumor: 0       Number of Lymph Nodes Examined: 11       Nodal Level(s) Examined: Level VI  Distant Metastasis: Not applicable  Pathologic Stage Classification (pTNM, AJCC 8th Edition): pT2, pN0  Ancillary Studies: Available upon request  Representative Tumor Block: A2  Comment(s): None     Thyroid  bed ultrasound 04/26/2022   Post total thyroidectomy without residual or recurrent thyroid  tissue within the isthmic or LEFT thyroidectomy bed.   Homogeneously hyperechoic soft tissue within the RIGHT thyroid  bed, with vascular flow on Doppler, measuring up to 0.9 x 0.8 x 0.6 cm.   No cervical adenopathy or abnormal fluid collection within the imaged neck.   IMPRESSION: 1. 0.9 cm hypoechoic soft tissue within the RIGHT thyroid  bed. Findings suspicious for residual thyroid  tissue. See key image 2. No residual or recurrent thyroid  tissue within the isthmic of or LEFT thyroidectomy bed.  ASSESSMENT/PLAN/RECOMMENDATIONS:   PTC :Stage I   - S/P  total thyroidectomy 04/2021 with 2.2 cm PTC  -Patient is at low risk for recurrence -Patient with excellent biochemical response -Tg, Tg AB pending today.  Historically thyroglobulin levels have been undetectable but she did have thyroglobulin antibodies detectable - We will proceed with thyroglobulin LCM -Will proceed with thyroid  bed ultrasound  -TSH goal 0.5-2.0 u IU/mL   2.  Postoperative Hypothyroidism:  -Patient is clinically euthyroid except for heat intolerance - TSh   Medication Levothyroxine  150 mcg daily    Follow-up in 6 months  Signed electronically by: Natale Bail, MD  [facility] Endocrinology  [facility] [address] [employer] [place], [place] [address] Phone: [phone] FAX: [phone]   CC: Jobe Mulder, DO [address] [place] [address] Phone: [phone] Fax: [phone]   Return to Endocrinology clinic as below: Future Appointments  Date Time Provider Department Center  07/01/2023 11:00 AM Price, Mamie Searles, PT [facility] None

## 2023-06-29 ENCOUNTER — Ambulatory Visit: Payer: Self-pay | Admitting: Internal Medicine

## 2023-06-29 MED ORDER — LEVOTHYROXINE SODIUM 175 MCG PO TABS: 175.0000 ug | ORAL_TABLET | Freq: Every day | ORAL | 3 refills | Status: DC

## 2023-07-01 ENCOUNTER — Ambulatory Visit: Admitting: Physical Therapy

## 2023-07-03 LAB — TSH: TSH: 23.52 m[IU]/L — ABNORMAL HIGH

## 2023-07-03 LAB — THYROGLOBULIN, LC/MS/MS: THYROGLOBULIN, LC/MS/MS: <0.4 ng/mL

## 2023-07-05 ENCOUNTER — Other Ambulatory Visit

## 2023-07-08 ENCOUNTER — Encounter: Payer: Self-pay | Admitting: Physical Therapy

## 2023-07-08 ENCOUNTER — Other Ambulatory Visit: Payer: Self-pay

## 2023-07-08 ENCOUNTER — Ambulatory Visit: Admitting: Physical Therapy

## 2023-07-08 DIAGNOSIS — N393 Stress incontinence (female) (male): Secondary | ICD-10-CM | POA: Diagnosis not present

## 2023-07-08 DIAGNOSIS — R279 Unspecified lack of coordination: Secondary | ICD-10-CM | POA: Insufficient documentation

## 2023-07-08 DIAGNOSIS — Z01419 Encounter for gynecological examination (general) (routine) without abnormal findings: Secondary | ICD-10-CM | POA: Insufficient documentation

## 2023-07-08 DIAGNOSIS — R3915 Urgency of urination: Secondary | ICD-10-CM | POA: Insufficient documentation

## 2023-07-08 DIAGNOSIS — M62838 Other muscle spasm: Secondary | ICD-10-CM | POA: Diagnosis present

## 2023-07-08 DIAGNOSIS — R293 Abnormal posture: Secondary | ICD-10-CM | POA: Insufficient documentation

## 2023-07-08 NOTE — Therapy (Signed)
 OUTPATIENT PHYSICAL THERAPY FEMALE PELVIC EVALUATION   Patient Name: Megan Webb MRN: [id number] DOB:[birth year], 28 y.o., female Today's Date: 07/08/2023  END OF SESSION:  PT End of Session - 07/08/23 1024     Visit Number 1    Number of Visits 8    Date for PT Re-Evaluation 08/05/23    Authorization Type [employer]/Medicaid [employer]    Authorization - Number of Visits 27    PT Start Time 0845    PT Stop Time 0930    PT Time Calculation (min) 45 min    Activity Tolerance Patient tolerated treatment well    Behavior During Therapy WFL for tasks assessed/performed          Past Medical History:  Diagnosis Date   Anemia    on Iron supplements   Anxiety    in the past; not current   Cancer (HCC)    Cyst of right kidney    Depression    in the past; not current   Headache    has migraines, takes Extra Strength Tylenol  with minimal relief   Hypertension    Hyperthyroidism    nodule on thyroid ; but not diagnosed with hyperthyroidism   Pre-diabetes    Umbilical hernia 2020   Past Surgical History:  Procedure Laterality Date   BUNIONECTOMY     LYMPH NODE DISSECTION N/A 05/08/2021   Procedure: LIMITED LYMPH NODE DISSECTION;  Surgeon: Oralee Billow, MD;  Location: [facility];  Service: General;  Laterality: N/A;   THYROIDECTOMY N/A 05/08/2021   Procedure: THYROIDECTOMY;  Surgeon: Oralee Billow, MD;  Location: [facility];  Service: General;  Laterality: N/A;   Patient Active Problem List   Diagnosis Date Noted   Postoperative hypothyroidism 12/27/2022   H/O papillary adenocarcinoma of thyroid  12/27/2022   Papillary thyroid  carcinoma (HCC) 05/01/2021   Migraine without aura and without status migrainosus, not intractable 04/14/2019   Anemia 03/12/2018   Umbilical hernia 12/21/2017   Thyroid  nodule 08/19/2017   Cannabis abuse 08/16/2011   History of suicide attempt 08/15/2011   Anxiety 08/15/2011   Benzodiazepine (tranquilizer) overdose 08/14/2011    PCP: Jobe Mulder, DO (PCP)  REFERRING PROVIDER: Loetta Ringer, CNM   REFERRING DIAG: [phone] (ICD-10-CM) - Well woman exam with routine gynecological exam R39.15 (ICD-10-CM) - Urinary urgency N39.3 (ICD-10-CM) - Stress incontinence of urine  THERAPY DIAG:  Other muscle spasm  Unspecified lack of coordination  Abnormal posture  Rationale for Evaluation and Treatment: Rehabilitation  ONSET DATE: 1 year ago after having third child   SUBJECTIVE:  SUBJECTIVE STATEMENT: Patient reports that she has a high sense of urinary urgency - if she cannot make it to the bathroom in time and she will leak. When she drinks caffeine she has a harder time holding urine.  Fluid intake: 3 water bottles per day (16oz), 2x/wk drinks coffee,1-2x/week energy, occasionally a soda    PAIN:  Are you having pain? No NPRS scale: 0/10  PRECAUTIONS: None  RED FLAGS: None   WEIGHT BEARING RESTRICTIONS: No  FALLS:  Has patient fallen in last 6 months? No  OCCUPATION: stay at home mom  ACTIVITY LEVEL : works out 3x/wk - treadmill or lifting weights ([employer])   PLOF: Independent with basic ADLs  PATIENT GOALS: decrease the leakage, strengthen the pelvic floor   PERTINENT HISTORY:  No major surgical history  Sexual abuse: No  BOWEL MOVEMENT: Pain with bowel movement: No Type of bowel movement:Type (Bristol Stool Scale) 1-5, Frequency at least 1x/day, Strain if she is constipated, and Splinting no Fully empty rectum: Yes: sometimes Leakage: No Pads: No Fiber supplement/laxative Yes - miralax if constipated   URINATION: Pain with urination: No Fully empty bladder: Nofeels like she could still empty  Stream: Strong Urgency: Yes  Frequency: within normal limits - gets up 1x/night to pee  Leakage: Urge to  void Pads: No  INTERCOURSE: currently sexually active   Ability to have vaginal penetration No  Pain with intercourse: none DrynessYes  Climax: yes Marinoff Scale: 0/3 lubricant: no   PREGNANCY: Vaginal deliveries 3 - 11/2020 Tearing Yes: tearing with first, very small tear  Episiotomy No C-section deliveries 0 Currently pregnant No  PROLAPSE: Pressure - when not fully emptying bladder   OBJECTIVE:  Note: Objective measures were completed at Evaluation unless otherwise noted.  PATIENT SURVEYS:  PFIQ-7: 96  COGNITION: Overall cognitive status: Within functional limits for tasks assessed     SENSATION: Light touch: Appears intact  LUMBAR SPECIAL TESTS:  Single leg stance test: Positive  FUNCTIONAL TESTS:  Squat: bilateral dynamic knee valgus with loading and lumbopelvic stiffness present also   GAIT: Assistive device utilized: None Comments: mild trendelenburg gait pattern with ambulation  POSTURE: rounded shoulders, forward head, and flexed trunk   LUMBARAROM/PROM:  A/PROM A/PROM  eval  Flexion 25% limited  Extension 25% limited  Right lateral flexion Within normal limits   Left lateral flexion Within normal limits   Right rotation Within normal limits   Left rotation Within normal limits    (Blank rows = not tested)  LOWER EXTREMITY ROM: within functional limits   Active ROM Right eval Left eval  Hip flexion    Hip extension    Hip abduction    Hip adduction    Hip internal rotation    Hip external rotation    Knee flexion    Knee extension    Ankle dorsiflexion    Ankle plantarflexion    Ankle inversion    Ankle eversion     (Blank rows = not tested)  LOWER EXTREMITY MMT: 4/5 bilateral knees and hips grossly   PALPATION:   General: no significant tenderness to palpation of bilaterall adductors or hip flexors   Pelvic Alignment: within normal limits   Abdominal: upper chest breathing and abdominal bracing at rest, decreased lower rib  excursion with inhalation                 External Perineal Exam: minimal dryness present with sufficient clitoral hood mobility as well  Internal Pelvic Floor: patient demonstrates increased tone throughout the superficial and deep pelvic floor bilaterally with initial penetration of vaginal canal. She had no pain with palpation of all pelvic floor musculature. She is able to do a strong, well-timed pelvic floor contraction but she is lacking full active range of motion of the pelvic floor due to stiffness. We introduced deep diaphragmatic breathing with pelvic floor lengthening during inhalation and shortening during exhalation to start improving this active range of motion. No pain following examination.  Patient confirms identification and approves PT to assess internal pelvic floor and treatment Yes No emotional/communication barriers or cognitive limitation. Patient is motivated to learn. Patient understands and agrees with treatment goals and plan. PT explains patient will be examined in standing, sitting, and lying down to see how their muscles and joints work. When they are ready, they will be asked to remove their underwear so PT can examine their perineum. The patient is also given the option of providing their own chaperone as one is not provided in our facility. The patient also has the right and is explained the right to defer or refuse any part of the evaluation or treatment including the internal exam. With the patient's consent, PT will use one gloved finger to gently assess the muscles of the pelvic floor, seeing how well it contracts and relaxes and if there is muscle symmetry. After, the patient will get dressed and PT and patient will discuss exam findings and plan of care. PT and patient discuss plan of care, schedule, attendance policy and HEP activities.  PELVIC MMT:   MMT eval  Vaginal 4/5, 5 second hold, 5 quick flicks   Internal Anal Sphincter    External Anal Sphincter   Puborectalis   Diastasis Recti   (Blank rows = not tested)  TONE: Increased throughout bilateral superficial and deep pelvic floor muscles   PROLAPSE: None present in hooklying assessment   TODAY'S TREATMENT:                                                                                                                              DATE:   EVAL 07/08/23: Examination completed, findings reviewed, pt educated on POC. Pt motivated to participate in PT and agreeable to attempt recommendations.   Neuro re-ed: Hooklying diaphragmatic breathing + pelvic floor lengthening with inhalation and shortening with exhalation 3x10  Self care:  Double voiding technique Relative anatomy and the connection between the diaphragm and pelvic floor   PATIENT EDUCATION:  Education details: Double voiding technique, Relative anatomy and the connection between the diaphragm and pelvic floor Person educated: Patient Education method: Explanation, Demonstration, Tactile cues, Verbal cues, and Handouts Education comprehension: verbalized understanding, returned demonstration, verbal cues required, tactile cues required, and needs further education  HOME EXERCISE PROGRAM: Access Code: [email or web address] URL: [email or web address] Date: 07/08/2023 Prepared by: Robbin Chill  Exercises - Supine Pelvic Floor Contraction  - 1 x daily - 7 x weekly - 3  sets - 10 reps  ASSESSMENT:  CLINICAL IMPRESSION: Patient is a 28 y.o. female  who was seen today for physical therapy evaluation and treatment for urinary urgency and urge urinary incontinence. She reports noticing this increased urgency and leakage last year. She has had 3 vaginal deliveries with 1 episode of tearing with first delivery. Her leakage only happens when she is holding her urine for too long and trying to get to the bathroom on time. Pt demonstrates increased tone throughout the superficial and deep pelvic floor  bilaterally with initial penetration of vaginal canal. She had no pain with palpation of all pelvic floor musculature. She is able to do a strong, well-timed pelvic floor contraction but she is lacking full active range of motion of the pelvic floor due to stiffness. We introduced deep diaphragmatic breathing with pelvic floor lengthening during inhalation and shortening during exhalation to start improving this active range of motion. No pain following examination. Overall, patient tolerated session well and Pt would benefit from additional PT to further address deficits.    OBJECTIVE IMPAIRMENTS: decreased coordination, decreased endurance, decreased ROM, decreased strength, and pain.   ACTIVITY LIMITATIONS: continence  PARTICIPATION LIMITATIONS: N/A  PERSONAL FACTORS: Past/current experiences and Time since onset of injury/illness/exacerbation are also affecting patient's functional outcome.   REHAB POTENTIAL: Good  CLINICAL DECISION MAKING: Stable/uncomplicated  EVALUATION COMPLEXITY: Low   GOALS: Goals reviewed with patient? Yes  SHORT TERM GOALS: Target date: 08/05/2023  Pt will be independent with HEP.  Baseline: Goal status: INITIAL  2.  Pt will be independent with the knack, urge suppression technique, and double voiding in order to improve bladder habits and decrease urinary incontinence. Baseline:  Goal status: INITIAL  3.  Pt will be independent with diaphragmatic breathing and down training activities in order to improve pelvic floor relaxation. Baseline:  Goal status: INITIAL  LONG TERM GOALS: Target date: 01/07/2024  Pt will be independent with advanced HEP.   Baseline:  Goal status: INITIAL  2.  Pt to demonstrate improved coordination of pelvic floor and breathing mechanics with 10# squat with appropriate synergistic patterns to decrease pain and leakage at least 75% of the time for improved ability to complete a 30 minute workout with strain at pelvic floor and  symptoms.    Baseline:  Goal status: INITIAL  3.  Pt will be able to go 2-3 hours in between voids without urgency or incontinence in order to improve QOL and perform all functional activities with less difficulty.   Baseline:  Goal status: INITIAL  4.  Pt will be able to functional actions such as walk to the bathroom with a high sense of urgency without leakage to prevent having to change  clothes multiple times per day. Baseline:  Goal status: INITIAL  PLAN:  PT FREQUENCY: 1-2x/week  PT DURATION: 6 months  PLANNED INTERVENTIONS: 97110-Therapeutic exercises, 97530- Therapeutic activity, 97112- Neuromuscular re-education, 97535- Self Care, 16109- Manual therapy, Patient/Family education, Taping, Joint mobilization, Spinal mobilization, Scar mobilization, Cryotherapy, and Moist heat  PLAN FOR NEXT SESSION: continued pelvic floor AROM training in seated, introduce downtraining and hip and core strengthening as tolerable, introduce urge drill   Marni Sins, PT 07/08/2023, 10:27 AM

## 2023-07-08 NOTE — Patient Instructions (Signed)
 Sit all the way down on the toilet with feet flat  Try not to push the urine out, let it flow naturally  When you think you are done voiding, try voiding once more to ensure your bladder is fully empty. This is called the double voiding technique

## 2023-07-13 ENCOUNTER — Ambulatory Visit
Admission: RE | Admit: 2023-07-13 | Discharge: 2023-07-13 | Disposition: A | Discharge status: Home / Self Care | Source: Ambulatory Visit | Attending: Internal Medicine | Admitting: Internal Medicine

## 2023-07-13 DIAGNOSIS — Z85850 Personal history of malignant neoplasm of thyroid: Secondary | ICD-10-CM

## 2023-07-26 ENCOUNTER — Ambulatory Visit: Payer: Self-pay | Admitting: Physical Therapy

## 2023-07-26 DIAGNOSIS — M62838 Other muscle spasm: Secondary | ICD-10-CM | POA: Diagnosis present

## 2023-07-26 DIAGNOSIS — R279 Unspecified lack of coordination: Secondary | ICD-10-CM | POA: Insufficient documentation

## 2023-07-26 DIAGNOSIS — R293 Abnormal posture: Secondary | ICD-10-CM | POA: Diagnosis present

## 2023-07-26 NOTE — Therapy (Signed)
 OUTPATIENT PHYSICAL THERAPY FEMALE PELVIC TREATMENT   Patient Name: Megan Webb MRN: [id number] DOB:August 18, 1995, 28 y.o., female Today's Date: 07/26/2023  END OF SESSION:  PT End of Session - 07/26/23 1612     Visit Number 2    Number of Visits 8    Date for PT Re-Evaluation 08/05/23    Authorization Type [employer]    Authorization - Number of Visits 27    PT Start Time 0330    PT Stop Time 0415    PT Time Calculation (min) 45 min    Activity Tolerance Patient tolerated treatment well    Behavior During Therapy WFL for tasks assessed/performed           Past Medical History:  Diagnosis Date   Anemia    on Iron supplements   Anxiety    in the past; not current   Cancer (HCC)    Cyst of right kidney    Depression    in the past; not current   Headache    has migraines, takes Extra Strength Tylenol  with minimal relief   Hypertension    Hyperthyroidism    nodule on thyroid ; but not diagnosed with hyperthyroidism   Pre-diabetes    Umbilical hernia 2020   Past Surgical History:  Procedure Laterality Date   BUNIONECTOMY     LYMPH NODE DISSECTION N/A 05/08/2021   Procedure: LIMITED LYMPH NODE DISSECTION;  Surgeon: Eletha Boas, MD;  Location: [facility];  Service: General;  Laterality: N/A;   THYROIDECTOMY N/A 05/08/2021   Procedure: THYROIDECTOMY;  Surgeon: Eletha Boas, MD;  Location: [facility];  Service: General;  Laterality: N/A;   Patient Active Problem List   Diagnosis Date Noted   Postoperative hypothyroidism 12/27/2022   H/O papillary adenocarcinoma of thyroid  12/27/2022   Papillary thyroid  carcinoma (HCC) 05/01/2021   Migraine without aura and without status migrainosus, not intractable 04/14/2019   Anemia 03/12/2018   Umbilical hernia 12/21/2017   Thyroid  nodule 08/19/2017   Cannabis abuse 08/16/2011   History of suicide attempt 08/15/2011   Anxiety 08/15/2011   Benzodiazepine (tranquilizer) overdose 08/14/2011    PCP: Frann Mabel Mt, DO (PCP)  REFERRING PROVIDER: Synthia Raisin, CNM   REFERRING DIAG: [phone] (ICD-10-CM) - Well woman exam with routine gynecological exam R39.15 (ICD-10-CM) - Urinary urgency N39.3 (ICD-10-CM) - Stress incontinence of urine  THERAPY DIAG:  Other muscle spasm  Unspecified lack of coordination  Abnormal posture  Rationale for Evaluation and Treatment: Rehabilitation  ONSET DATE: 1 year ago after having third child   SUBJECTIVE:  SUBJECTIVE STATEMENT: Patient has been consistent with HEP and she thinks that her urinary control is improving. No urinary incontinence to report, a couple of close calls. She has been constipated since last visit, she has been straining slightly when pooping.   FROM eval: Patient reports that she has a high sense of urinary urgency - if she cannot make it to the bathroom in time and she will leak. When she drinks caffeine she has a harder time holding urine.  Fluid intake: 3 water bottles per day (16oz), 2x/wk drinks coffee,1-2x/week energy, occasionally a soda    PAIN:  Are you having pain? No NPRS scale: 0/10  PRECAUTIONS: None  RED FLAGS: None   WEIGHT BEARING RESTRICTIONS: No  FALLS:  Has patient fallen in last 6 months? No  OCCUPATION: stay at home mom  ACTIVITY LEVEL : works out 3x/wk - treadmill or lifting weights ([employer])   PLOF: Independent with basic ADLs  PATIENT GOALS: decrease the leakage, strengthen the pelvic floor   PERTINENT HISTORY:  No major surgical history  Sexual abuse: No  BOWEL MOVEMENT: Pain with bowel movement: No Type of bowel movement:Type (Bristol Stool Scale) 1-5, Frequency at least 1x/day, Strain if she is constipated, and Splinting no Fully empty rectum: Yes: sometimes Leakage: No Pads: No Fiber  supplement/laxative Yes - miralax if constipated   URINATION: Pain with urination: No Fully empty bladder: Nofeels like she could still empty  Stream: Strong Urgency: Yes  Frequency: within normal limits - gets up 1x/night to pee  Leakage: Urge to void Pads: No  INTERCOURSE: currently sexually active   Ability to have vaginal penetration No  Pain with intercourse: none DrynessYes  Climax: yes Marinoff Scale: 0/3 lubricant: no   PREGNANCY: Vaginal deliveries 3 - 11/2020 Tearing Yes: tearing with first, very small tear  Episiotomy No C-section deliveries 0 Currently pregnant No  PROLAPSE: Pressure - when not fully emptying bladder   OBJECTIVE:  Note: Objective measures were completed at Evaluation unless otherwise noted.  PATIENT SURVEYS:  PFIQ-7: 55  COGNITION: Overall cognitive status: Within functional limits for tasks assessed     SENSATION: Light touch: Appears intact  LUMBAR SPECIAL TESTS:  Single leg stance test: Positive  FUNCTIONAL TESTS:  Squat: bilateral dynamic knee valgus with loading and lumbopelvic stiffness present also   GAIT: Assistive device utilized: None Comments: mild trendelenburg gait pattern with ambulation  POSTURE: rounded shoulders, forward head, and flexed trunk   LUMBARAROM/PROM:  A/PROM A/PROM  eval  Flexion 25% limited  Extension 25% limited  Right lateral flexion Within normal limits   Left lateral flexion Within normal limits   Right rotation Within normal limits   Left rotation Within normal limits    (Blank rows = not tested)  LOWER EXTREMITY ROM: within functional limits   Active ROM Right eval Left eval  Hip flexion    Hip extension    Hip abduction    Hip adduction    Hip internal rotation    Hip external rotation    Knee flexion    Knee extension    Ankle dorsiflexion    Ankle plantarflexion    Ankle inversion    Ankle eversion     (Blank rows = not tested)  LOWER EXTREMITY MMT: 4/5 bilateral  knees and hips grossly   PALPATION:   General: no significant tenderness to palpation of bilaterall adductors or hip flexors   Pelvic Alignment: within normal limits   Abdominal: upper chest breathing and abdominal bracing  at rest, decreased lower rib excursion with inhalation                 External Perineal Exam: minimal dryness present with sufficient clitoral hood mobility as well                              Internal Pelvic Floor: patient demonstrates increased tone throughout the superficial and deep pelvic floor bilaterally with initial penetration of vaginal canal. She had no pain with palpation of all pelvic floor musculature. She is able to do a strong, well-timed pelvic floor contraction but she is lacking full active range of motion of the pelvic floor due to stiffness. We introduced deep diaphragmatic breathing with pelvic floor lengthening during inhalation and shortening during exhalation to start improving this active range of motion. No pain following examination.  Patient confirms identification and approves PT to assess internal pelvic floor and treatment Yes No emotional/communication barriers or cognitive limitation. Patient is motivated to learn. Patient understands and agrees with treatment goals and plan. PT explains patient will be examined in standing, sitting, and lying down to see how their muscles and joints work. When they are ready, they will be asked to remove their underwear so PT can examine their perineum. The patient is also given the option of providing their own chaperone as one is not provided in our facility. The patient also has the right and is explained the right to defer or refuse any part of the evaluation or treatment including the internal exam. With the patient's consent, PT will use one gloved finger to gently assess the muscles of the pelvic floor, seeing how well it contracts and relaxes and if there is muscle symmetry. After, the patient will get  dressed and PT and patient will discuss exam findings and plan of care. PT and patient discuss plan of care, schedule, attendance policy and HEP activities.  PELVIC MMT:   MMT eval  Vaginal 4/5, 5 second hold, 5 quick flicks   Internal Anal Sphincter   External Anal Sphincter   Puborectalis   Diastasis Recti   (Blank rows = not tested)  TONE: Increased throughout bilateral superficial and deep pelvic floor muscles   PROLAPSE: None present in hooklying assessment   TODAY'S TREATMENT:                                                                                                                              DATE:   EVAL 07/08/23: Examination completed, findings reviewed, pt educated on POC. Pt motivated to participate in PT and agreeable to attempt recommendations.   Neuro re-ed: Hooklying diaphragmatic breathing + pelvic floor lengthening with inhalation and shortening with exhalation 3x10  Self care:  Double voiding technique Relative anatomy and the connection between the diaphragm and pelvic floor   07/26/23: Neuro re-ed: seated diaphragmatic breathing + pelvic floor lengthening with inhalation and shortening with  exhalation 3x10  Sit to stand + GTB + diaphragmatic breathing 2x10  Bridge + GTB + diaphragmatic breathing 2x10  Sidelying clamshell + reverse clamshell + diaphragmatic breathing 2x10  Self care:  Double voiding technique Relative anatomy and the connection between the diaphragm and pelvic floor  How to breathe when passing bowel movements and voiding to ensure fully emptying  Urge drill for urge urinary incontinence  Manual therapy  Abdominal massage for constipation management / education on bowel massage   PATIENT EDUCATION:  Education details: Double voiding technique, Relative anatomy and the connection between the diaphragm and pelvic floor Person educated: Patient Education method: Explanation, Demonstration, Tactile cues, Verbal cues, and  Handouts Education comprehension: verbalized understanding, returned demonstration, verbal cues required, tactile cues required, and needs further education  HOME EXERCISE PROGRAM: Access Code: [email or web address] URL: [email or web address] Date: 07/26/2023 Prepared by: Celena Domino  Exercises - Seated Pelvic Floor Contraction  - 1 x daily - 7 x weekly - 2 sets - 10 reps - Seated Quick Flick Pelvic Floor Contractions  - 1 x daily - 7 x weekly - 2 sets - 10 reps - Squat with Resistance at Thighs  - 1 x daily - 7 x weekly - 2 sets - 10 reps - Supine Bridge with Resistance Band  - 1 x daily - 7 x weekly - 2 sets - 10 reps - Clamshell  - 1 x daily - 7 x weekly - 2 sets - 10 reps - Sidelying Reverse Clamshell  - 1 x daily - 7 x weekly - 2 sets - 10 reps  ASSESSMENT:  CLINICAL IMPRESSION: Patient is a 28 y.o. female  who was seen today for physical therapy evaluation and treatment for urinary urgency and urge urinary incontinence. No leakage to report since last session, which patient is very pleased with. We reviewed the urge drill for urge urinary incontinence, abdominal massage for constipation, and toileting mechanics for optimal emptying with Bms and urination. Introduction to hip and core strengthening went very well and patient required max cueing from PT to coordinate breathing appropriately.  Overall, patient tolerated session well and Pt would benefit from additional PT to further address deficits.    OBJECTIVE IMPAIRMENTS: decreased coordination, decreased endurance, decreased ROM, decreased strength, and pain.   ACTIVITY LIMITATIONS: continence  PARTICIPATION LIMITATIONS: N/A  PERSONAL FACTORS: Past/current experiences and Time since onset of injury/illness/exacerbation are also affecting patient's functional outcome.   REHAB POTENTIAL: Good  CLINICAL DECISION MAKING: Stable/uncomplicated  EVALUATION COMPLEXITY: Low   GOALS: Goals reviewed with patient? Yes  SHORT TERM  GOALS: Target date: 08/05/2023  Pt will be independent with HEP.  Baseline: Goal status: INITIAL  2.  Pt will be independent with the knack, urge suppression technique, and double voiding in order to improve bladder habits and decrease urinary incontinence. Baseline:  Goal status: INITIAL  3.  Pt will be independent with diaphragmatic breathing and down training activities in order to improve pelvic floor relaxation. Baseline:  Goal status: INITIAL  LONG TERM GOALS: Target date: 01/07/2024  Pt will be independent with advanced HEP.   Baseline:  Goal status: INITIAL  2.  Pt to demonstrate improved coordination of pelvic floor and breathing mechanics with 10# squat with appropriate synergistic patterns to decrease pain and leakage at least 75% of the time for improved ability to complete a 30 minute workout with strain at pelvic floor and symptoms.    Baseline:  Goal status: INITIAL  3.  Pt will be able to go  2-3 hours in between voids without urgency or incontinence in order to improve QOL and perform all functional activities with less difficulty.   Baseline:  Goal status: INITIAL  4.  Pt will be able to functional actions such as walk to the bathroom with a high sense of urgency without leakage to prevent having to change  clothes multiple times per day. Baseline:  Goal status: INITIAL  PLAN:  PT FREQUENCY: 1-2x/week  PT DURATION: 6 months  PLANNED INTERVENTIONS: 97110-Therapeutic exercises, 97530- Therapeutic activity, 97112- Neuromuscular re-education, 97535- Self Care, [id number]- Manual therapy, Patient/Family education, Taping, Joint mobilization, Spinal mobilization, Scar mobilization, Cryotherapy, and Moist heat  PLAN FOR NEXT SESSION: continued pelvic floor AROM training in seated, introduce downtraining and hip and core strengthening as tolerable, introduce urge drill   Celena JAYSON Domino, PT 07/26/2023, 4:14 PM

## 2023-07-26 NOTE — Patient Instructions (Signed)
 Urge Incontinence  Ideal urination frequency is every 2-4 wakeful hours, which equates to 5-8 times within a 24-hour period.   Urge incontinence is leakage that occurs when the bladder muscle contracts, creating a sudden need to go before getting to the bathroom.   Going too often when your bladder isn't actually full can disrupt the body's automatic signals to store and hold urine longer, which will increase urgency/frequency.  In this case, the bladder "is running the show" and strategies can be learned to retrain this pattern.   One should be able to control the first urge to urinate, at around .  The bladder can hold up to a "grande latte," or . To help you gain control, practice the Urge Drill below when urgency strikes.  This drill will help retrain your bladder signals and allow you to store and hold urine longer.  The overall goal is to stretch out your time between voids to reach a more manageable voiding schedule.    Practice your quick flicks often throughout the day (each waking hour) even when you don't need feel the urge to go.  This will help strengthen your pelvic floor muscles, making them more effective in controlling leakage.  Urge Drill  When you feel an urge to go, follow these steps to regain control: Stop what you are doing and be still Take one deep breath, directing your air into your abdomen Think an affirming thought, such as "I've got this." Do 5 quick flicks of your pelvic floor Walk with control to the bathroom to void, or delay voiding   How to position yourself on the toilet for optimal emptying: When you sit down to void: Sit all the way down on the toilet, feet flat on the floor  No straining or pushing the urine out, rather let it flow  If you feel like you still need to go after you are done - try going again (double voiding) to ensure you are emptying all the way  How to poop: Sit all the way down, feet flat on the floor  Try using a foot  stool or squatty potty to see if you can poop easier  Imagine fogging up a mirror with your breath as you exhale and gently push the stool down and out  Potty dance: move side to side, forward/back to see if you can empty anymore   About Abdominal Massage  Abdominal massage, also called external colon massage, is a self-treatment circular massage technique that can reduce and eliminate gas and ease constipation. The colon naturally contracts in waves in a clockwise direction starting from inside the right hip, moving up toward the ribs, across the belly, and down inside the left hip.  When you perform circular abdominal massage, you help stimulate your colon's normal wave pattern of movement called peristalsis.  It is most beneficial when done after eating.  Positioning You can practice abdominal massage with oil while lying down, or in the shower with soap.  Some people find that it is just as effective to do the massage through clothing while sitting or standing.  How to Massage Start by placing your finger tips or knuckles on your right side, just inside your hip bone.  Make small circular movements while you move upward toward your rib cage.   Once you reach the bottom right side of your rib cage, take your circular movements across to the left side of the bottom of your rib cage.  Next, move downward until  you reach the inside of your left hip bone.  This is the path your feces travel in your colon. Continue to perform your abdominal massage in this pattern for 10 minutes each day.     You can apply as much pressure as is comfortable in your massage.  Start gently and build pressure as you continue to practice.  Notice any areas of pain as you massage; areas of slight pain may be relieved as you massage, but if you have areas of significant or intense pain, consult with your healthcare provider.  Other Considerations General physical activity including bending and stretching can have a  beneficial massage-like effect on the colon.  Deep breathing can also stimulate the colon because breathing deeply activates the same nervous system that supplies the colon.   Abdominal massage should always be used in combination with a bowel-conscious diet that is high in the proper type of fiber for you, fluids (primarily water), and a regular exercise program.

## 2023-09-27 ENCOUNTER — Other Ambulatory Visit

## 2023-12-28 ENCOUNTER — Encounter: Payer: Self-pay | Admitting: Internal Medicine

## 2023-12-28 ENCOUNTER — Other Ambulatory Visit

## 2023-12-28 ENCOUNTER — Ambulatory Visit: Admitting: Internal Medicine

## 2023-12-28 VITALS — BP 118/82 | Ht 63.0 in | Wt 171.0 lb

## 2023-12-28 DIAGNOSIS — Z85850 Personal history of malignant neoplasm of thyroid: Secondary | ICD-10-CM | POA: Diagnosis not present

## 2023-12-28 DIAGNOSIS — E890 Postprocedural hypothyroidism: Secondary | ICD-10-CM

## 2023-12-28 NOTE — Progress Notes (Unsigned)
 Name: Megan Webb  MRN/ DOB: [id number], 1995-07-20    Age/ Sex: 28 y.o., female    PCP: Frann Mabel Mt, DO   Reason for Endocrinology Evaluation: Thyroid  nodule      Date of Initial Endocrinology Evaluation: 10/15/2020    HPI: Megan Webb is a 28 y.o. female with a past medical history of Thyroid  nodule . The patient presented for initial endocrinology clinic visit on 10/15/2020 for consultative assistance with her Thyroid  nodule .      HISTORICAL SUMMARY:   n review of her records she has been diagnosed with left thyroid  nodule 2.2 cm in 2018 and an FNA was recommended. I am unable to find records of FNA of the thyroid  nodule but per pt she already had an FNA  through [facility] .  In 2021 repeat ultrasound confirmed left thyroid  nodule and FNA was again recommended. 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  No extracapsular invasion   07/03/2021: Tg 0.2 (Ab 2) 04/05/2022: Tg 0.1 ( Ab 1) 12/27/2022: Tg  <0.1 (Ab 2)   No family history of thyroid  disease  SUBJECTIVE:     Today (12/28/23):  Megan Webb is here for a follow up on postoperative hypothyroidism and PTC  No local neck swelling  No palpitations  She is not using the pillbox  No changes in bowel movements  She is COC, no plans for conception    Levothyroxine  175 mcg daily    HISTORY:  Past Medical History:  Past Medical History:  Diagnosis Date    Anemia    on Iron supplements   Anxiety    in the past; not current   Cancer (HCC)    Cyst of right kidney    Depression    in the past; not current   Headache    has migraines, takes Extra Strength Tylenol  with minimal relief   Hypertension    Hyperthyroidism    nodule on thyroid ; but not diagnosed with hyperthyroidism   Pre-diabetes    Umbilical hernia 2020   Past Surgical History:  Past Surgical History:  Procedure Laterality Date   BUNIONECTOMY     LYMPH NODE DISSECTION N/A 05/08/2021   Procedure: LIMITED LYMPH NODE DISSECTION;  Surgeon: Eletha Boas, MD;  Location: [facility];  Service: General;  Laterality: N/A;   THYROIDECTOMY N/A 05/08/2021   Procedure: THYROIDECTOMY;  Surgeon: Eletha Boas, MD;  Location: [facility];  Service: General;  Laterality: N/A;    Social History:  reports that she has never smoked. She has never used smokeless tobacco. She reports current alcohol use. She reports that she does not currently use drugs after having used the following drugs: Marijuana. Frequency: 1.00 time per week. Family History: family history includes Anemia in her mother; Arthritis in her maternal grandfather, maternal grandmother, and paternal grandfather; Asthma in her brother; Cancer in her maternal aunt; Cataracts in her father;  Diabetes in her maternal aunt and maternal uncle; Hypertension in her maternal grandfather, maternal grandmother, and mother; Kidney disease in her mother; Mental illness in her father and mother; Stroke in her maternal grandfather, maternal grandmother, and paternal grandfather.   HOME MEDICATIONS: Allergies as of 12/28/2023   No Known Allergies      Medication List        Accurate as of December 28, 2023 10:34 AM. If you have any questions, ask your nurse or doctor.          STOP taking these medications    clotrimazole  1 % vaginal cream Commonly known as: GYNE-LOTRIMIN  Stopped by: Donell PARAS Chalsea Darko   fluconazole  150 MG tablet Commonly  known as: DIFLUCAN  Stopped by: Donell PARAS Jazz Biddy   metroNIDAZOLE  500 MG tablet Commonly known as: FLAGYL  Stopped by: Tamsin Nader J Paeton Latouche       TAKE these medications    cholecalciferol 25 MCG (1000 UNIT) tablet Commonly known as: VITAMIN D3 Take 1,000 Units by mouth daily.   drospirenone -ethinyl estradiol  3-0.03 MG tablet Commonly known as: Yasmin  28 Take 1 tablet by mouth daily.   ferrous sulfate  325 (65 FE) MG tablet Take 325 mg by mouth daily with breakfast.   levothyroxine  175 MCG tablet Commonly known as: SYNTHROID  Take 1 tablet (175 mcg total) by mouth daily.          REVIEW OF SYSTEMS: A comprehensive ROS was conducted with the patient and is negative except as per HPI    OBJECTIVE:  VS: BP 118/82   Ht 5' 3 (1.6 m)   Wt 171 lb (77.6 kg)   BMI 30.29 kg/m    Wt Readings from Last 3 Encounters:  12/28/23 171 lb (77.6 kg)  06/28/23 171 lb (77.6 kg)  05/03/23 170 lb (77.1 kg)     EXAM: General: Pt appears well and is in NAD  Neck: General: Supple without adenopathy. Thyroid : NO nodule appreciated.  Lungs: Clear with good BS bilat with no rales, rhonchi, or wheezes  Heart: Auscultation: RRR.  Abdomen: Normoactive bowel sounds, soft, nontender, without masses or organomegaly palpable  Extremities:  BL LE: No pretibial edema normal ROM and strength.  Mental Status: Judgment, insight: Intact Orientation: Oriented to time, place, and person Mood and affect: No depression, anxiety, or agitation     DATA REVIEWED:   Latest Reference Range & Units 12/28/23 10:50  TSH mIU/L 0.02 (L)  (L): Data is abnormally low  Pathology report 05/08/2021   SURGICAL PATHOLOGY  CASE: [id number]  PATIENT: Megan Webb  Surgical Pathology Report      Clinical History: Papillary thyroid  carcinoma (crm)      FINAL MICROSCOPIC DIAGNOSIS:   A. THYROID , TOTAL THYROIDECTOMY (26 g):  Papillary carcinoma, classic subtype, involving the left  lobe  Tumor limited to thyroid  and measures 2.2 x 2.1 x 1.6 cm (pT2)  Background chronic thyroiditis   B. LYMPH NODE, CENTRAL COMPARTMENT, LEVEL VI, RESECTION:  Eleven benign reactive lymph nodes, negative for carcinoma (0/11, pN0)   ONCOLOGY TABLE:   THYROID  GLAND, CARCINOMA: Resection   Procedure: Total thyroidectomy  Tumor Focality: Unifocal  Tumor Site: Left lobe  Tumor Size: 2.2 x 2.1 x 1.6 cm  Histologic Type: Papillary carcinoma, classic subtype  Angioinvasion: Not identified  Lymphatic Invasion: Not identified  Extrathyroidal Extension: Not identified  Margin Status: All margins negative for invasive carcinoma  Regional Lymph Node Status:       Number of Lymph Nodes with Tumor: 0  Number of Lymph Nodes Examined: 11       Nodal Level(s) Examined: Level VI  Distant Metastasis: Not applicable  Pathologic Stage Classification (pTNM, AJCC 8th Edition): pT2, pN0  Ancillary Studies: Available upon request  Representative Tumor Block: A2  Comment(s): None     Thyroid  bed ultrasound 46/18/2025 FINDINGS: The thyroid  gland is surgically absent. No evidence of residual or recurrent tissue in the thyroid  resection bed. No suspicious nodules or lymphadenopathy.   IMPRESSION: Surgical changes of total thyroidectomy without evidence of residual or recurrent disease.    ASSESSMENT/PLAN/RECOMMENDATIONS:   Hx of PTC :Stage I  -No local neck symptoms - S/P total thyroidectomy 04/2021 with 2.2 cm PTC  -Patient is at low risk for recurrence -Patient with excellent biochemical response -TG, TG AB pending, but historically this has been undetectable -TSH goal 0.5-2.0 u IU/mL - We opted to repeat thyroid  bed ultrasound on next visit   2.  Postoperative Hypothyroidism:  - Patient is clinically euthyroid - I did encourage the patient again to start using a pillbox, she has been more compliant with taking levothyroxine  daily - Her TSH has decreased from 23.5 to u IU/ML to  0.02 u IU/ML, this is not surprising because she has been consistent with levothyroxine  175 mcg dose.  I will decrease the dose and recheck TSH in 2 months  Medication Stop levothyroxine  175 mcg daily Start levothyroxine  150 mcg daily   Follow-up in 6 months  Signed electronically by: Stefano Redgie Butts, MD  [facility] Endocrinology  [facility] [address] [place] [place], [place] [address] Phone: [phone] FAX: [phone]   CC: Frann Mabel Mt, DO [address] [place] [address] Phone: [phone] Fax: [phone]   Return to Endocrinology clinic as below: No future appointments.

## 2023-12-29 ENCOUNTER — Ambulatory Visit: Payer: Self-pay | Admitting: Internal Medicine

## 2023-12-29 MED ORDER — LEVOTHYROXINE SODIUM 150 MCG PO TABS: 150.0000 ug | ORAL_TABLET | Freq: Every day | ORAL | 3 refills | Status: AC

## 2023-12-31 LAB — THYROGLOBULIN ANTIBODY: Thyroglobulin Ab: 3 [IU]/mL — ABNORMAL HIGH (ref <=1)

## 2023-12-31 LAB — TSH: TSH: 0.02 m[IU]/L — ABNORMAL LOW

## 2023-12-31 LAB — THYROGLOBULIN LEVEL: Thyroglobulin: <0.1 ng/mL — ABNORMAL LOW

## 2024-03-02 ENCOUNTER — Telehealth: Admitting: Emergency Medicine

## 2024-03-02 DIAGNOSIS — B3731 Acute candidiasis of vulva and vagina: Secondary | ICD-10-CM

## 2024-03-02 MED ORDER — FLUCONAZOLE 150 MG PO TABS: 150.0000 mg | ORAL_TABLET | ORAL | 0 refills | Status: AC

## 2024-03-02 NOTE — Progress Notes (Signed)

## 2024-06-27 ENCOUNTER — Ambulatory Visit: Admitting: Internal Medicine
